# Patient Record
Sex: Female | Born: 1935 | Race: White | Hispanic: No | Smoking: Never smoker
Health system: Southern US, Community
[De-identification: ages and names within clinical notes are randomized; demographics above are authoritative.]

## PROBLEM LIST (undated history)

## (undated) DIAGNOSIS — R296 Repeated falls: Secondary | ICD-10-CM

## (undated) DIAGNOSIS — M1990 Unspecified osteoarthritis, unspecified site: Secondary | ICD-10-CM

## (undated) DIAGNOSIS — I447 Left bundle-branch block, unspecified: Secondary | ICD-10-CM

## (undated) DIAGNOSIS — K219 Gastro-esophageal reflux disease without esophagitis: Secondary | ICD-10-CM

## (undated) DIAGNOSIS — D649 Anemia, unspecified: Secondary | ICD-10-CM

## (undated) DIAGNOSIS — I609 Nontraumatic subarachnoid hemorrhage, unspecified: Secondary | ICD-10-CM

## (undated) DIAGNOSIS — J449 Chronic obstructive pulmonary disease, unspecified: Secondary | ICD-10-CM

## (undated) DIAGNOSIS — E538 Deficiency of other specified B group vitamins: Secondary | ICD-10-CM

## (undated) DIAGNOSIS — E871 Hypo-osmolality and hyponatremia: Secondary | ICD-10-CM

## (undated) DIAGNOSIS — M949 Disorder of cartilage, unspecified: Secondary | ICD-10-CM

## (undated) DIAGNOSIS — I509 Heart failure, unspecified: Secondary | ICD-10-CM

## (undated) DIAGNOSIS — I10 Essential (primary) hypertension: Secondary | ICD-10-CM

## (undated) DIAGNOSIS — M810 Age-related osteoporosis without current pathological fracture: Secondary | ICD-10-CM

## (undated) DIAGNOSIS — M899 Disorder of bone, unspecified: Secondary | ICD-10-CM

## (undated) DIAGNOSIS — S065X9A Traumatic subdural hemorrhage with loss of consciousness of unspecified duration, initial encounter: Secondary | ICD-10-CM

## (undated) DIAGNOSIS — E039 Hypothyroidism, unspecified: Secondary | ICD-10-CM

## (undated) DIAGNOSIS — I359 Nonrheumatic aortic valve disorder, unspecified: Secondary | ICD-10-CM

## (undated) HISTORY — DX: Hypo-osmolality and hyponatremia: E87.1

## (undated) HISTORY — DX: Nontraumatic subarachnoid hemorrhage, unspecified: I60.9

## (undated) HISTORY — PX: TONSILLECTOMY: SUR1361

## (undated) HISTORY — DX: Essential (primary) hypertension: I10

## (undated) HISTORY — DX: Heart failure, unspecified: I50.9

## (undated) HISTORY — PX: OTHER SURGICAL HISTORY: SHX169

## (undated) HISTORY — PX: APPENDECTOMY: SHX54

## (undated) HISTORY — DX: Chronic obstructive pulmonary disease, unspecified: J44.9

## (undated) HISTORY — DX: Anemia, unspecified: D64.9

## (undated) HISTORY — DX: Nonrheumatic aortic valve disorder, unspecified: I35.9

## (undated) HISTORY — DX: Left bundle-branch block, unspecified: I44.7

## (undated) HISTORY — DX: Age-related osteoporosis without current pathological fracture: M81.0

## (undated) HISTORY — DX: Traumatic subdural hemorrhage with loss of consciousness of unspecified duration, initial encounter: S06.5X9A

## (undated) HISTORY — DX: Disorder of cartilage, unspecified: M94.9

## (undated) HISTORY — DX: Disorder of bone, unspecified: M89.9

## (undated) HISTORY — PX: ABDOMINAL HYSTERECTOMY: SHX81

## (undated) HISTORY — DX: Unspecified osteoarthritis, unspecified site: M19.90

## (undated) HISTORY — DX: Gastro-esophageal reflux disease without esophagitis: K21.9

## (undated) HISTORY — DX: Hypothyroidism, unspecified: E03.9

## (undated) HISTORY — DX: Deficiency of other specified B group vitamins: E53.8

---

## 2000-08-02 ENCOUNTER — Encounter: Admission: RE | Admit: 2000-08-02 | Discharge: 2000-10-31 | Payer: Self-pay | Admitting: Internal Medicine

## 2000-11-07 ENCOUNTER — Encounter: Payer: Self-pay | Admitting: Emergency Medicine

## 2000-11-07 ENCOUNTER — Emergency Department (HOSPITAL_COMMUNITY): Admission: EM | Admit: 2000-11-07 | Discharge: 2000-11-07 | Payer: Self-pay | Admitting: Emergency Medicine

## 2001-01-10 ENCOUNTER — Encounter: Admission: RE | Admit: 2001-01-10 | Discharge: 2001-04-10 | Payer: Self-pay | Admitting: Internal Medicine

## 2001-04-25 ENCOUNTER — Encounter: Admission: RE | Admit: 2001-04-25 | Discharge: 2001-07-24 | Payer: Self-pay | Admitting: Internal Medicine

## 2004-02-14 ENCOUNTER — Emergency Department (HOSPITAL_COMMUNITY): Admission: EM | Admit: 2004-02-14 | Discharge: 2004-02-14 | Payer: Self-pay | Admitting: Emergency Medicine

## 2004-03-31 ENCOUNTER — Ambulatory Visit: Payer: Self-pay | Admitting: Internal Medicine

## 2004-04-21 ENCOUNTER — Ambulatory Visit: Payer: Self-pay | Admitting: Internal Medicine

## 2004-06-17 ENCOUNTER — Ambulatory Visit: Payer: Self-pay | Admitting: Internal Medicine

## 2004-07-16 ENCOUNTER — Ambulatory Visit: Payer: Self-pay | Admitting: Internal Medicine

## 2004-08-13 ENCOUNTER — Ambulatory Visit: Payer: Self-pay | Admitting: Internal Medicine

## 2004-10-22 ENCOUNTER — Ambulatory Visit: Payer: Self-pay | Admitting: Internal Medicine

## 2004-11-09 ENCOUNTER — Ambulatory Visit (HOSPITAL_COMMUNITY): Admission: RE | Admit: 2004-11-09 | Discharge: 2004-11-09 | Payer: Self-pay | Admitting: Internal Medicine

## 2004-11-09 ENCOUNTER — Ambulatory Visit: Payer: Self-pay | Admitting: Internal Medicine

## 2005-01-25 ENCOUNTER — Ambulatory Visit: Payer: Self-pay | Admitting: Internal Medicine

## 2005-02-17 ENCOUNTER — Ambulatory Visit: Payer: Self-pay | Admitting: Internal Medicine

## 2005-04-04 ENCOUNTER — Ambulatory Visit: Payer: Self-pay | Admitting: Internal Medicine

## 2005-05-05 ENCOUNTER — Inpatient Hospital Stay (HOSPITAL_COMMUNITY): Admission: EM | Admit: 2005-05-05 | Discharge: 2005-05-07 | Payer: Self-pay | Admitting: Dietician

## 2005-05-05 ENCOUNTER — Ambulatory Visit: Payer: Self-pay | Admitting: Internal Medicine

## 2005-05-06 ENCOUNTER — Encounter: Payer: Self-pay | Admitting: Cardiology

## 2005-05-06 ENCOUNTER — Ambulatory Visit: Payer: Self-pay | Admitting: Cardiology

## 2005-05-27 ENCOUNTER — Ambulatory Visit: Payer: Self-pay | Admitting: Internal Medicine

## 2005-05-30 ENCOUNTER — Emergency Department (HOSPITAL_COMMUNITY): Admission: EM | Admit: 2005-05-30 | Discharge: 2005-05-30 | Payer: Self-pay | Admitting: *Deleted

## 2005-06-01 ENCOUNTER — Ambulatory Visit: Payer: Self-pay | Admitting: Internal Medicine

## 2005-07-08 ENCOUNTER — Ambulatory Visit: Payer: Self-pay | Admitting: Internal Medicine

## 2005-08-05 ENCOUNTER — Ambulatory Visit: Payer: Self-pay | Admitting: Internal Medicine

## 2005-08-12 ENCOUNTER — Ambulatory Visit: Payer: Self-pay | Admitting: Internal Medicine

## 2005-09-02 ENCOUNTER — Ambulatory Visit: Payer: Self-pay | Admitting: Internal Medicine

## 2005-10-07 ENCOUNTER — Ambulatory Visit: Payer: Self-pay | Admitting: Internal Medicine

## 2005-11-22 ENCOUNTER — Ambulatory Visit: Payer: Self-pay | Admitting: Internal Medicine

## 2005-11-22 ENCOUNTER — Ambulatory Visit (HOSPITAL_COMMUNITY): Admission: RE | Admit: 2005-11-22 | Discharge: 2005-11-22 | Payer: Self-pay | Admitting: Internal Medicine

## 2005-12-23 ENCOUNTER — Ambulatory Visit: Payer: Self-pay | Admitting: Internal Medicine

## 2006-02-03 ENCOUNTER — Ambulatory Visit: Payer: Self-pay | Admitting: Internal Medicine

## 2006-03-03 ENCOUNTER — Ambulatory Visit: Payer: Self-pay | Admitting: Internal Medicine

## 2006-03-31 ENCOUNTER — Ambulatory Visit: Payer: Self-pay | Admitting: Internal Medicine

## 2006-04-28 ENCOUNTER — Ambulatory Visit: Payer: Self-pay | Admitting: Internal Medicine

## 2006-06-16 ENCOUNTER — Ambulatory Visit: Payer: Self-pay | Admitting: Internal Medicine

## 2006-06-16 LAB — CONVERTED CEMR LAB
ALT: 13 units/L (ref 0–40)
AST: 19 units/L (ref 0–37)
Basophils Relative: 0.7 % (ref 0.0–1.0)
Bilirubin, Direct: 0.1 mg/dL (ref 0.0–0.3)
Calcium: 9.5 mg/dL (ref 8.4–10.5)
Chloride: 104 meq/L (ref 96–112)
GFR calc non Af Amer: 84 mL/min
Glucose, Bld: 97 mg/dL (ref 70–99)
HCT: 34.4 % — ABNORMAL LOW (ref 36.0–46.0)
Hemoglobin: 11.6 g/dL — ABNORMAL LOW (ref 12.0–15.0)
LDL Cholesterol: 84 mg/dL (ref 0–99)
Monocytes Absolute: 1 10*3/uL — ABNORMAL HIGH (ref 0.2–0.7)
Monocytes Relative: 13.9 % — ABNORMAL HIGH (ref 3.0–11.0)
Neutrophils Relative %: 62.7 % (ref 43.0–77.0)
RDW: 14.9 % — ABNORMAL HIGH (ref 11.5–14.6)
TSH: 1 microintl units/mL (ref 0.35–5.50)
VLDL: 13 mg/dL (ref 0–40)
Vitamin B-12: >1500 pg/mL — ABNORMAL HIGH (ref 211–911)
WBC: 7 10*3/uL (ref 4.5–10.5)

## 2006-07-11 ENCOUNTER — Ambulatory Visit: Payer: Self-pay | Admitting: Internal Medicine

## 2006-07-21 ENCOUNTER — Ambulatory Visit: Payer: Self-pay | Admitting: Internal Medicine

## 2006-08-18 ENCOUNTER — Ambulatory Visit: Payer: Self-pay | Admitting: Internal Medicine

## 2006-09-15 ENCOUNTER — Ambulatory Visit: Payer: Self-pay | Admitting: Internal Medicine

## 2006-10-11 ENCOUNTER — Encounter: Payer: Self-pay | Admitting: Internal Medicine

## 2006-10-11 DIAGNOSIS — R55 Syncope and collapse: Secondary | ICD-10-CM

## 2006-10-11 DIAGNOSIS — I447 Left bundle-branch block, unspecified: Secondary | ICD-10-CM

## 2006-10-11 DIAGNOSIS — E538 Deficiency of other specified B group vitamins: Secondary | ICD-10-CM

## 2006-10-11 DIAGNOSIS — I10 Essential (primary) hypertension: Secondary | ICD-10-CM

## 2006-10-11 DIAGNOSIS — M899 Disorder of bone, unspecified: Secondary | ICD-10-CM | POA: Insufficient documentation

## 2006-10-11 DIAGNOSIS — I498 Other specified cardiac arrhythmias: Secondary | ICD-10-CM

## 2006-10-11 DIAGNOSIS — J309 Allergic rhinitis, unspecified: Secondary | ICD-10-CM | POA: Insufficient documentation

## 2006-10-11 DIAGNOSIS — E039 Hypothyroidism, unspecified: Secondary | ICD-10-CM

## 2006-10-11 DIAGNOSIS — M949 Disorder of cartilage, unspecified: Secondary | ICD-10-CM

## 2006-10-11 HISTORY — DX: Disorder of bone, unspecified: M89.9

## 2006-10-11 HISTORY — DX: Hypothyroidism, unspecified: E03.9

## 2006-10-11 HISTORY — DX: Left bundle-branch block, unspecified: I44.7

## 2006-10-11 HISTORY — DX: Essential (primary) hypertension: I10

## 2006-10-11 HISTORY — DX: Deficiency of other specified B group vitamins: E53.8

## 2006-10-20 ENCOUNTER — Ambulatory Visit: Payer: Self-pay | Admitting: Internal Medicine

## 2006-11-24 ENCOUNTER — Ambulatory Visit: Payer: Self-pay | Admitting: Internal Medicine

## 2006-12-19 ENCOUNTER — Ambulatory Visit (HOSPITAL_COMMUNITY): Admission: RE | Admit: 2006-12-19 | Discharge: 2006-12-19 | Payer: Self-pay | Admitting: Internal Medicine

## 2007-01-26 ENCOUNTER — Ambulatory Visit: Payer: Self-pay | Admitting: Internal Medicine

## 2007-01-26 LAB — CONVERTED CEMR LAB
Basophils Relative: 0.1 % (ref 0.0–1.0)
Bilirubin, Direct: 0.1 mg/dL (ref 0.0–0.3)
CO2: 29 meq/L (ref 19–32)
Eosinophils Relative: 1.5 % (ref 0.0–5.0)
GFR calc Af Amer: 102 mL/min
Glucose, Bld: 94 mg/dL (ref 70–99)
HCT: 33.7 % — ABNORMAL LOW (ref 36.0–46.0)
Hemoglobin: 11.5 g/dL — ABNORMAL LOW (ref 12.0–15.0)
Lymphocytes Relative: 14.9 % (ref 12.0–46.0)
Monocytes Absolute: 1.2 10*3/uL — ABNORMAL HIGH (ref 0.2–0.7)
Neutro Abs: 6 10*3/uL (ref 1.4–7.7)
Neutrophils Relative %: 70.1 % (ref 43.0–77.0)
Potassium: 4.6 meq/L (ref 3.5–5.1)
Total Bilirubin: 0.7 mg/dL (ref 0.3–1.2)
Total Protein: 7 g/dL (ref 6.0–8.3)
WBC: 8.6 10*3/uL (ref 4.5–10.5)

## 2007-02-04 ENCOUNTER — Emergency Department (HOSPITAL_COMMUNITY): Admission: EM | Admit: 2007-02-04 | Discharge: 2007-02-04 | Payer: Self-pay | Admitting: Emergency Medicine

## 2007-02-06 ENCOUNTER — Ambulatory Visit: Payer: Self-pay | Admitting: Internal Medicine

## 2007-02-09 ENCOUNTER — Ambulatory Visit: Payer: Self-pay

## 2007-02-09 ENCOUNTER — Encounter: Payer: Self-pay | Admitting: Internal Medicine

## 2007-02-10 ENCOUNTER — Inpatient Hospital Stay (HOSPITAL_COMMUNITY): Admission: EM | Admit: 2007-02-10 | Discharge: 2007-02-14 | Payer: Self-pay | Admitting: Emergency Medicine

## 2007-02-13 ENCOUNTER — Ambulatory Visit: Payer: Self-pay | Admitting: Internal Medicine

## 2007-02-16 ENCOUNTER — Ambulatory Visit: Payer: Self-pay | Admitting: Internal Medicine

## 2007-02-23 ENCOUNTER — Ambulatory Visit: Payer: Self-pay | Admitting: Internal Medicine

## 2007-02-23 ENCOUNTER — Encounter: Payer: Self-pay | Admitting: Internal Medicine

## 2007-02-23 DIAGNOSIS — I359 Nonrheumatic aortic valve disorder, unspecified: Secondary | ICD-10-CM

## 2007-02-23 DIAGNOSIS — R21 Rash and other nonspecific skin eruption: Secondary | ICD-10-CM | POA: Insufficient documentation

## 2007-02-23 DIAGNOSIS — M810 Age-related osteoporosis without current pathological fracture: Secondary | ICD-10-CM | POA: Insufficient documentation

## 2007-02-23 HISTORY — DX: Nonrheumatic aortic valve disorder, unspecified: I35.9

## 2007-02-23 HISTORY — DX: Age-related osteoporosis without current pathological fracture: M81.0

## 2007-04-24 ENCOUNTER — Emergency Department (HOSPITAL_COMMUNITY): Admission: EM | Admit: 2007-04-24 | Discharge: 2007-04-24 | Payer: Self-pay | Admitting: Emergency Medicine

## 2007-04-27 ENCOUNTER — Ambulatory Visit: Payer: Self-pay | Admitting: Internal Medicine

## 2007-04-27 DIAGNOSIS — R079 Chest pain, unspecified: Secondary | ICD-10-CM

## 2007-04-30 ENCOUNTER — Encounter: Payer: Self-pay | Admitting: Internal Medicine

## 2007-05-01 ENCOUNTER — Inpatient Hospital Stay (HOSPITAL_COMMUNITY): Admission: EM | Admit: 2007-05-01 | Discharge: 2007-05-09 | Payer: Self-pay | Admitting: Emergency Medicine

## 2007-05-01 ENCOUNTER — Ambulatory Visit: Payer: Self-pay | Admitting: Internal Medicine

## 2007-05-04 ENCOUNTER — Encounter: Payer: Self-pay | Admitting: Gastroenterology

## 2007-05-04 ENCOUNTER — Encounter: Payer: Self-pay | Admitting: Internal Medicine

## 2007-05-08 ENCOUNTER — Ambulatory Visit: Payer: Self-pay | Admitting: Gastroenterology

## 2007-05-13 ENCOUNTER — Inpatient Hospital Stay (HOSPITAL_COMMUNITY): Admission: EM | Admit: 2007-05-13 | Discharge: 2007-05-23 | Payer: Self-pay | Admitting: Emergency Medicine

## 2007-05-13 ENCOUNTER — Ambulatory Visit: Payer: Self-pay | Admitting: Cardiology

## 2007-05-15 ENCOUNTER — Encounter (INDEPENDENT_AMBULATORY_CARE_PROVIDER_SITE_OTHER): Payer: Self-pay | Admitting: Emergency Medicine

## 2007-05-30 ENCOUNTER — Emergency Department (HOSPITAL_COMMUNITY): Admission: EM | Admit: 2007-05-30 | Discharge: 2007-05-31 | Payer: Self-pay | Admitting: Emergency Medicine

## 2007-07-15 ENCOUNTER — Encounter: Payer: Self-pay | Admitting: Internal Medicine

## 2007-07-16 ENCOUNTER — Encounter: Payer: Self-pay | Admitting: Internal Medicine

## 2007-07-23 ENCOUNTER — Ambulatory Visit: Payer: Self-pay | Admitting: Internal Medicine

## 2007-07-23 DIAGNOSIS — R5383 Other fatigue: Secondary | ICD-10-CM

## 2007-07-23 DIAGNOSIS — J449 Chronic obstructive pulmonary disease, unspecified: Secondary | ICD-10-CM

## 2007-07-23 DIAGNOSIS — I509 Heart failure, unspecified: Secondary | ICD-10-CM | POA: Insufficient documentation

## 2007-07-23 DIAGNOSIS — E871 Hypo-osmolality and hyponatremia: Secondary | ICD-10-CM

## 2007-07-23 DIAGNOSIS — R5381 Other malaise: Secondary | ICD-10-CM | POA: Insufficient documentation

## 2007-07-23 HISTORY — DX: Heart failure, unspecified: I50.9

## 2007-07-23 HISTORY — DX: Chronic obstructive pulmonary disease, unspecified: J44.9

## 2007-07-23 HISTORY — DX: Hypo-osmolality and hyponatremia: E87.1

## 2007-07-24 LAB — CONVERTED CEMR LAB
ALT: 12 units/L (ref 0–35)
AST: 18 units/L (ref 0–37)
Albumin: 4 g/dL (ref 3.5–5.2)
Basophils Absolute: 0 10*3/uL (ref 0.0–0.1)
Calcium: 9.5 mg/dL (ref 8.4–10.5)
Chloride: 100 meq/L (ref 96–112)
Creatinine, Ser: 1.2 mg/dL (ref 0.4–1.2)
Eosinophils Relative: 1.7 % (ref 0.0–5.0)
HCT: 32.2 % — ABNORMAL LOW (ref 36.0–46.0)
MCHC: 32.5 g/dL (ref 30.0–36.0)
Neutrophils Relative %: 59.6 % (ref 43.0–77.0)
Platelets: 349 10*3/uL (ref 150–400)
RBC: 3.14 M/uL — ABNORMAL LOW (ref 3.87–5.11)
RDW: 17.9 % — ABNORMAL HIGH (ref 11.5–14.6)
Sodium: 137 meq/L (ref 135–145)
TSH: 1.29 microintl units/mL (ref 0.35–5.50)
Total Bilirubin: 0.9 mg/dL (ref 0.3–1.2)
WBC: 6.5 10*3/uL (ref 4.5–10.5)

## 2007-07-31 ENCOUNTER — Telehealth: Payer: Self-pay | Admitting: Internal Medicine

## 2007-08-21 ENCOUNTER — Telehealth (INDEPENDENT_AMBULATORY_CARE_PROVIDER_SITE_OTHER): Payer: Self-pay | Admitting: *Deleted

## 2007-09-14 ENCOUNTER — Ambulatory Visit: Payer: Self-pay | Admitting: Internal Medicine

## 2007-09-14 DIAGNOSIS — D649 Anemia, unspecified: Secondary | ICD-10-CM | POA: Insufficient documentation

## 2007-09-14 DIAGNOSIS — R634 Abnormal weight loss: Secondary | ICD-10-CM | POA: Insufficient documentation

## 2007-09-14 DIAGNOSIS — M545 Low back pain: Secondary | ICD-10-CM | POA: Insufficient documentation

## 2007-09-14 HISTORY — DX: Anemia, unspecified: D64.9

## 2007-11-07 ENCOUNTER — Ambulatory Visit: Payer: Self-pay | Admitting: Internal Medicine

## 2007-12-14 ENCOUNTER — Ambulatory Visit: Payer: Self-pay | Admitting: Internal Medicine

## 2007-12-24 ENCOUNTER — Ambulatory Visit (HOSPITAL_COMMUNITY): Admission: RE | Admit: 2007-12-24 | Discharge: 2007-12-24 | Payer: Self-pay | Admitting: Internal Medicine

## 2008-01-11 ENCOUNTER — Ambulatory Visit: Payer: Self-pay | Admitting: Internal Medicine

## 2008-02-07 ENCOUNTER — Ambulatory Visit: Payer: Self-pay | Admitting: Internal Medicine

## 2008-02-11 LAB — CONVERTED CEMR LAB
Basophils Absolute: 0 10*3/uL (ref 0.0–0.1)
Basophils Relative: 0.3 % (ref 0.0–3.0)
Eosinophils Absolute: 0.1 10*3/uL (ref 0.0–0.7)
Eosinophils Relative: 1.4 % (ref 0.0–5.0)
Folate: >20 ng/mL
HCT: 32.3 % — ABNORMAL LOW (ref 36.0–46.0)
Hemoglobin: 10.8 g/dL — ABNORMAL LOW (ref 12.0–15.0)
Iron: 106 ug/dL (ref 42–145)
Lymphocytes Relative: 16.6 % (ref 12.0–46.0)
MCHC: 33.5 g/dL (ref 30.0–36.0)
MCV: 105.3 fL — ABNORMAL HIGH (ref 78.0–100.0)
Monocytes Absolute: 1.1 10*3/uL — ABNORMAL HIGH (ref 0.1–1.0)
Monocytes Relative: 15.3 % — ABNORMAL HIGH (ref 3.0–12.0)
Neutro Abs: 4.6 10*3/uL (ref 1.4–7.7)
Neutrophils Relative %: 66.4 % (ref 43.0–77.0)
Platelets: 260 10*3/uL (ref 150–400)
RBC: 3.07 M/uL — ABNORMAL LOW (ref 3.87–5.11)
RDW: 14.3 % (ref 11.5–14.6)
Saturation Ratios: 36.3 % (ref 20.0–50.0)
TSH: 0.05 microintl units/mL — ABNORMAL LOW (ref 0.35–5.50)
Transferrin: 208.8 mg/dL — ABNORMAL LOW (ref >=212.0)
Vitamin B-12: >1500 pg/mL — ABNORMAL HIGH (ref 211–911)
WBC: 7 10*3/uL (ref 4.5–10.5)

## 2008-03-14 ENCOUNTER — Ambulatory Visit: Payer: Self-pay | Admitting: Internal Medicine

## 2008-06-11 ENCOUNTER — Ambulatory Visit: Payer: Self-pay | Admitting: Internal Medicine

## 2008-07-15 ENCOUNTER — Ambulatory Visit: Payer: Self-pay | Admitting: Internal Medicine

## 2008-08-22 ENCOUNTER — Ambulatory Visit: Payer: Self-pay | Admitting: Internal Medicine

## 2008-09-24 ENCOUNTER — Ambulatory Visit: Payer: Self-pay | Admitting: Internal Medicine

## 2008-09-24 LAB — CONVERTED CEMR LAB
ALT: 13 units/L (ref 0–35)
BUN: 25 mg/dL — ABNORMAL HIGH (ref 6–23)
Basophils Relative: 0.1 % (ref 0.0–3.0)
Bilirubin, Direct: 0.2 mg/dL (ref 0.0–0.3)
CO2: 30 meq/L (ref 19–32)
Chloride: 104 meq/L (ref 96–112)
Cholesterol: 152 mg/dL (ref 0–200)
Creatinine, Ser: 1.3 mg/dL — ABNORMAL HIGH (ref 0.4–1.2)
Eosinophils Absolute: 0.1 10*3/uL (ref 0.0–0.7)
Eosinophils Relative: 1.2 % (ref 0.0–5.0)
HCT: 31.8 % — ABNORMAL LOW (ref 36.0–46.0)
HDL: 73.7 mg/dL (ref >39.00)
Ketones, ur: NEGATIVE mg/dL
Lymphs Abs: 1.2 10*3/uL (ref 0.7–4.0)
MCHC: 33.6 g/dL (ref 30.0–36.0)
MCV: 107.6 fL — ABNORMAL HIGH (ref 78.0–100.0)
Monocytes Absolute: 0.8 10*3/uL (ref 0.1–1.0)
Neutrophils Relative %: 66.1 % (ref 43.0–77.0)
Platelets: 255 10*3/uL (ref 150.0–400.0)
Potassium: 4.1 meq/L (ref 3.5–5.1)
Specific Gravity, Urine: <=1.005 (ref 1.000–1.030)
Total Bilirubin: 1 mg/dL (ref 0.3–1.2)
Total Protein, Urine: NEGATIVE mg/dL
Total Protein: 7.1 g/dL (ref 6.0–8.3)
Triglycerides: 59 mg/dL (ref 0.0–149.0)
Urine Glucose: NEGATIVE mg/dL
WBC: 6.1 10*3/uL (ref 4.5–10.5)

## 2008-09-25 LAB — CONVERTED CEMR LAB: Vit D, 25-Hydroxy: 103 ng/mL — ABNORMAL HIGH (ref 30–89)

## 2008-11-14 ENCOUNTER — Ambulatory Visit: Payer: Self-pay | Admitting: Internal Medicine

## 2008-12-30 ENCOUNTER — Ambulatory Visit: Payer: Self-pay | Admitting: Internal Medicine

## 2009-02-05 ENCOUNTER — Ambulatory Visit: Payer: Self-pay | Admitting: Internal Medicine

## 2009-03-01 IMAGING — CT CT ABDOMEN WO/W CM
3 of 6 series · 14 of 46 positions shown, 16 images · IV contrast (omnipaque)
Comparison: MRI lumbar spine yesterday.

CLINICAL INFORMATION: Possible left upper pole renal mass on MRI of the lumbar
spine yesterday.

CT ABDOMEN WITHOUT AND WITH CONTRAST 05/03/2007:
TECHNIQUE: Multidetector CT imaging of the abdomen was performed before and
during bolus administration of intravenous contrast. Oral contrast was given.
Delayed imaging through the kidneys was performed.
Contrast:  100 cc Omnipaque 300

[Series 4: abd_ with 5.0 b40s · axial · 0.69mm/px · z∈[-270,-100]mm · 9 of 44 slices shown, 11 images]
[im 5/44  soft-tissue]
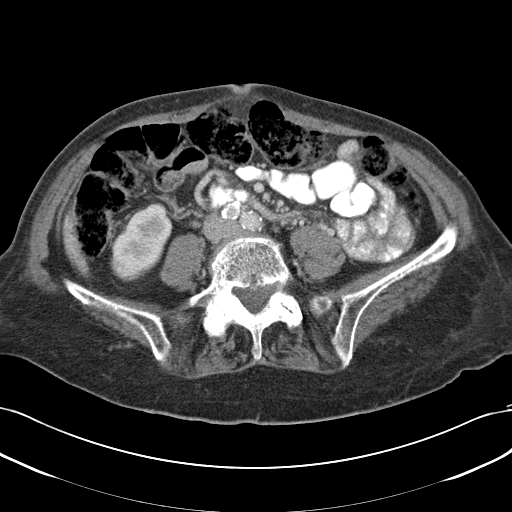
[im 5/44  bone]
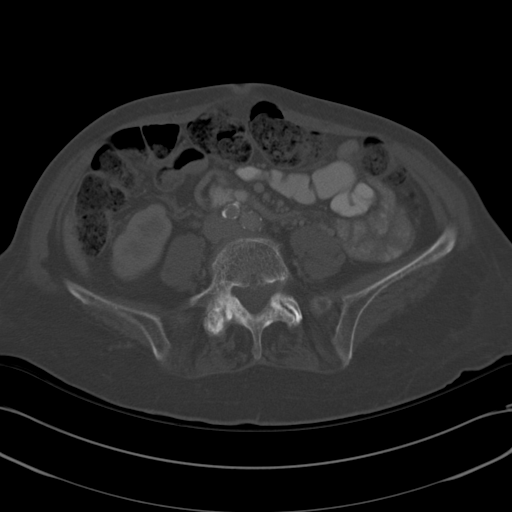
[im 9/44  soft-tissue]
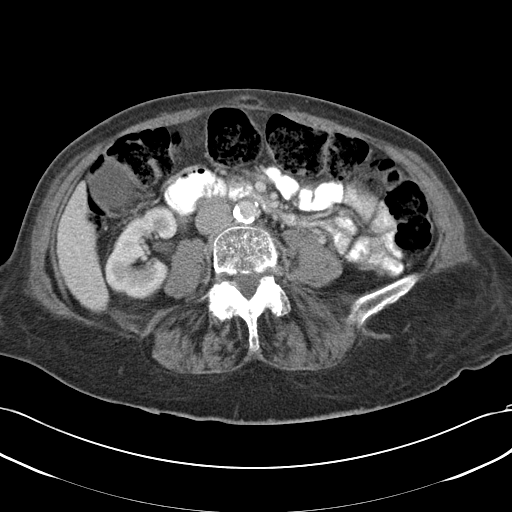
[im 13/44  soft-tissue]
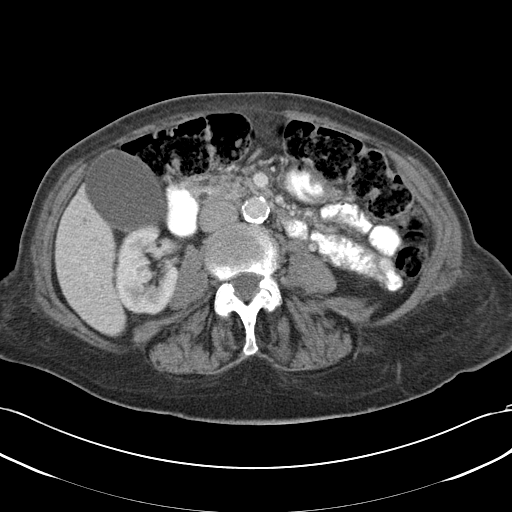
[im 18/44  soft-tissue]
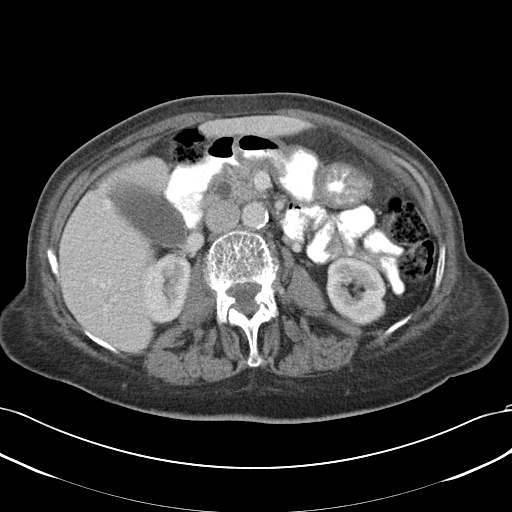
[im 22/44  soft-tissue]
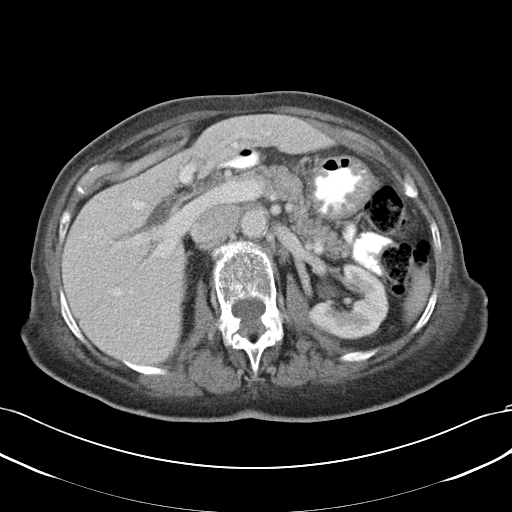
[im 26/44  soft-tissue]
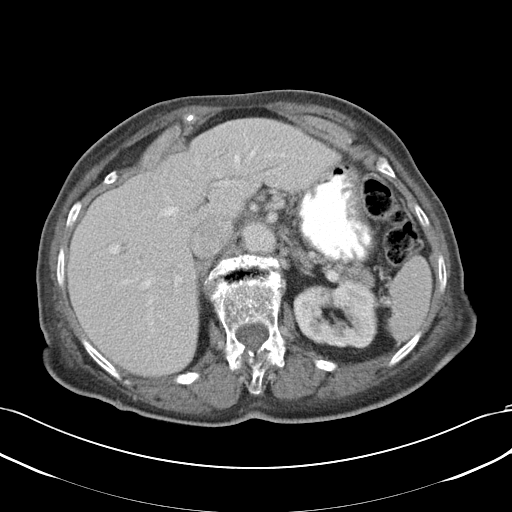
[im 31/44  soft-tissue]
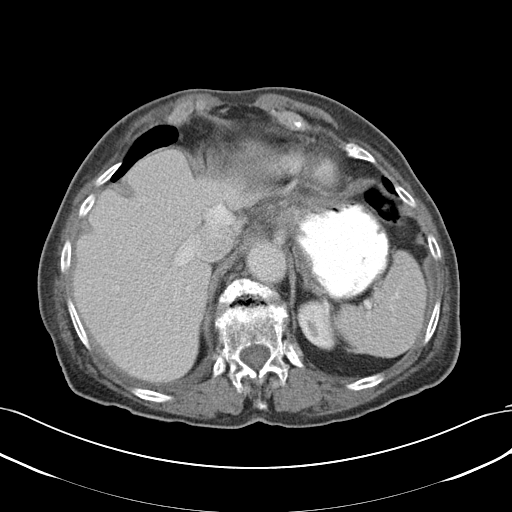
[im 35/44  soft-tissue]
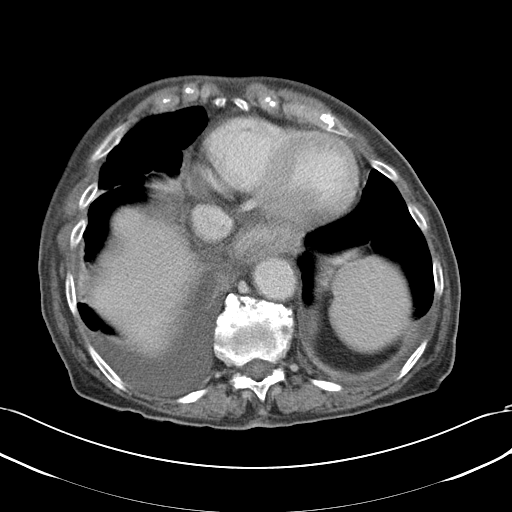
[im 39/44  soft-tissue]
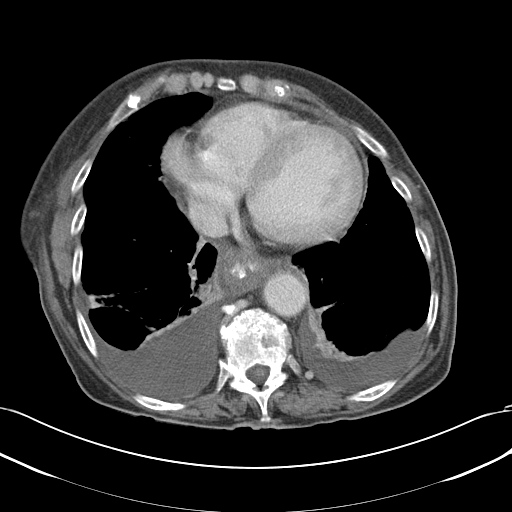
[im 39/44  bone]
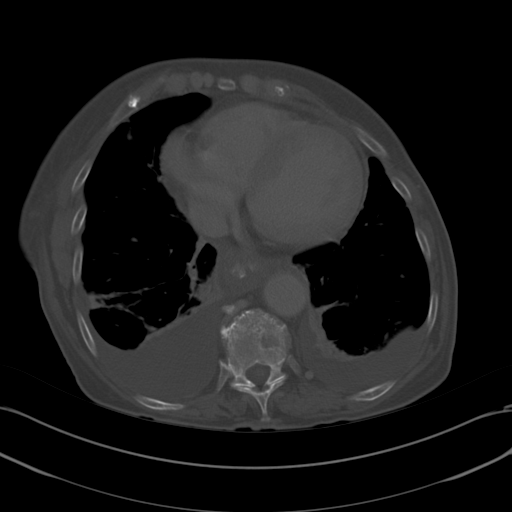

[Series 5: abd_ with 5.0 b60f · axial · 0.69mm/px · z∈[-130,-106]mm · 2 of 17 slices shown]
[im 6/17  soft-tissue]
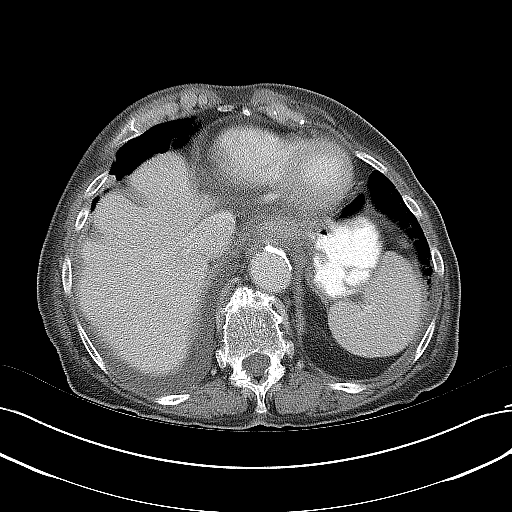
[im 11/17  soft-tissue]
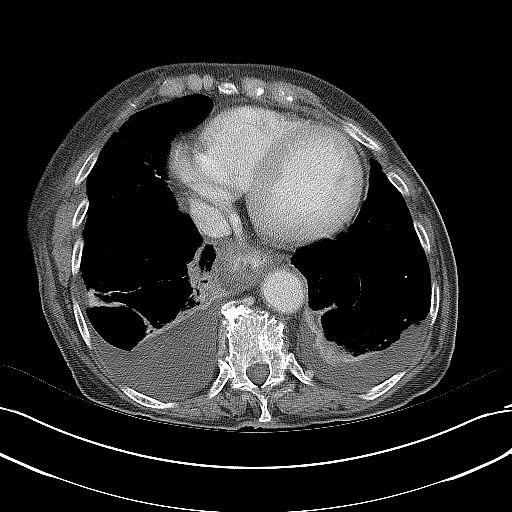

[Series 602: <mpr thick range> · coronal · 0.69mm/px · 3 of 81 slices shown]
[im 27/81  soft-tissue]
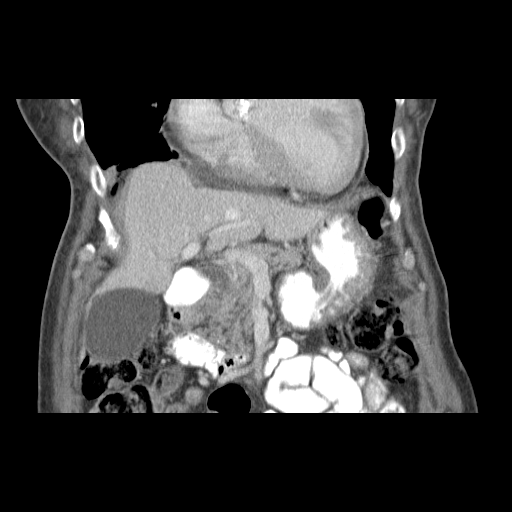
[im 36/81  soft-tissue]
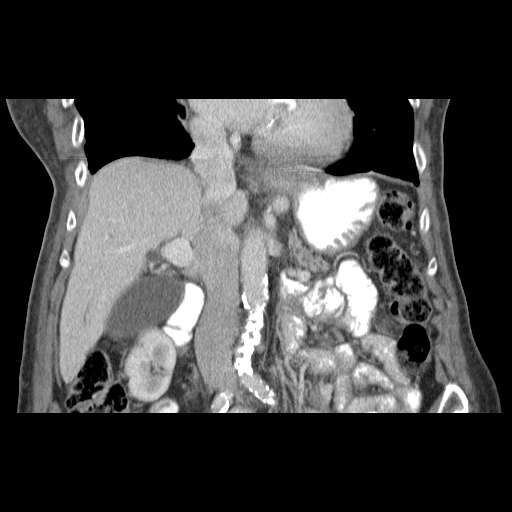
[im 45/81  soft-tissue]
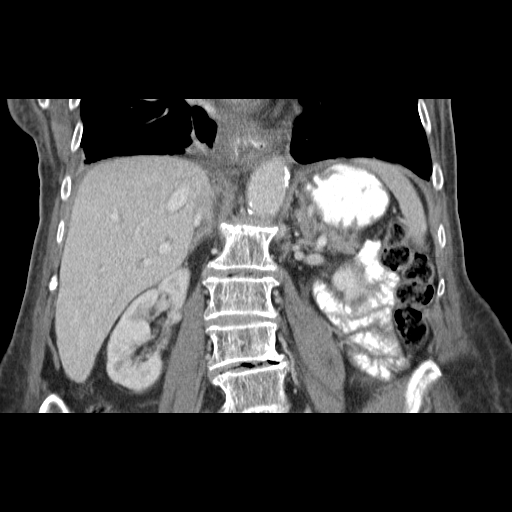

[14 of 46 positions shown; findings below may reference images not displayed]

FINDINGS: Unenhanced images demonstrating cortical calcifications in the upper
pole of the left kidney associated with an area of focal scarring. Enhanced
images demonstrating no left upper pole renal mass; due to the scarring, the
kidney has a lobular, almost mass-like contour in this region, accounting for
the abnormality on the MRI. No solid renal masses or cysts identified in either
kidney. Mild scarring also noted in the mid and lower pole of the right kidney.
No hydronephrosis involving either kidney.

Normal appearing liver, spleen, pancreas, and adrenal glands. Gallbladder
borderline distended without calcified gallstones. Mild extrahepatic biliary
ductal dilation up to approximately 13 mm diameter; the duct can be followed to
the ampulla without evidence of duct stone or visible mass. Small hiatal hernia
present with thickening of the wall of the distal esophagus and oral contrast
within the distal esophagus. Large amount of stool within an otherwise normal
appearing colon. Visualized small bowel unremarkable. Aortic and common iliac
artery atherosclerosis without aneurysm. Patent visceral arteries. No ascites.
No significant lymphadenopathy.

Bilateral pleural effusions, right greater than left, with associated
atelectasis in the lower lobes. Mild atelectasis or scarring in the right middle
lobe. Heart enlarged with left ventricular predominance. Bone window images
demonstrating severe diffuse lower thoracic and lumbar degenerative changes.
IMPRESSION: 1. No evidence of left upper pole renal mass as questioned on the MRI yesterday.
Scarring in the left upper lobe with lobular contour to the kidney in this
region accounts for that appearance.
2. Dilation of the extrahepatic common bile duct up to approximately 13 mm
diameter. In the absence of obstructing stone or mass, biliary dyskinesis
certainly possible.
3. Hiatal hernia with marked thickening of the wall of the distal esophagus and
oral contrast within the esophagus. Gastroesophageal reflux with distal
esophageal inflammation and/or scarring suspected, though distal esophageal mass
not entirely excluded. 
4. Bilateral pleural effusions, right greater than left.

## 2009-03-05 ENCOUNTER — Ambulatory Visit: Payer: Self-pay | Admitting: Internal Medicine

## 2009-03-17 ENCOUNTER — Inpatient Hospital Stay (HOSPITAL_COMMUNITY): Admission: EM | Admit: 2009-03-17 | Discharge: 2009-03-21 | Payer: Self-pay | Admitting: Emergency Medicine

## 2009-03-18 ENCOUNTER — Encounter (INDEPENDENT_AMBULATORY_CARE_PROVIDER_SITE_OTHER): Payer: Self-pay | Admitting: Internal Medicine

## 2009-03-18 ENCOUNTER — Ambulatory Visit: Payer: Self-pay | Admitting: Vascular Surgery

## 2009-04-16 ENCOUNTER — Ambulatory Visit: Payer: Self-pay | Admitting: Internal Medicine

## 2009-04-16 LAB — CONVERTED CEMR LAB
Anticardiolipin IgG: <10 (ref <10)
Homocysteine: 20.8 micromoles/L — ABNORMAL HIGH (ref 4.0–15.4)
Protein C Activity: 192 % — ABNORMAL HIGH (ref 75–133)

## 2009-04-21 LAB — CONVERTED CEMR LAB
ALT: 14 units/L (ref 0–35)
AST: 26 units/L (ref 0–37)
Albumin: 4 g/dL (ref 3.5–5.2)
Alkaline Phosphatase: 60 units/L (ref 39–117)
Basophils Relative: 0.7 % (ref 0.0–3.0)
Chloride: 97 meq/L (ref 96–112)
Eosinophils Relative: 4.6 % (ref 0.0–5.0)
Folate: >20 ng/mL
GFR calc non Af Amer: 28.03 mL/min (ref >60)
HCT: 29.4 % — ABNORMAL LOW (ref 36.0–46.0)
Hemoglobin: 9.8 g/dL — ABNORMAL LOW (ref 12.0–15.0)
Lymphs Abs: 1.2 10*3/uL (ref 0.7–4.0)
Monocytes Relative: 10.3 % (ref 3.0–12.0)
Platelets: 220 10*3/uL (ref 150.0–400.0)
Potassium: 4.4 meq/L (ref 3.5–5.1)
Pro B Natriuretic peptide (BNP): 310 pg/mL — ABNORMAL HIGH (ref 0.0–100.0)
RBC: 2.71 M/uL — ABNORMAL LOW (ref 3.87–5.11)
Sed Rate: 31 mm/hr — ABNORMAL HIGH (ref 0–22)
Sodium: 131 meq/L — ABNORMAL LOW (ref 135–145)
TSH: 7.22 microintl units/mL — ABNORMAL HIGH (ref 0.35–5.50)
Vitamin B-12: >1500 pg/mL — ABNORMAL HIGH (ref 211–911)
WBC: 5.7 10*3/uL (ref 4.5–10.5)

## 2009-06-11 ENCOUNTER — Encounter (HOSPITAL_BASED_OUTPATIENT_CLINIC_OR_DEPARTMENT_OTHER): Admission: RE | Admit: 2009-06-11 | Discharge: 2009-09-09 | Payer: Self-pay | Admitting: Internal Medicine

## 2009-06-11 ENCOUNTER — Ambulatory Visit: Payer: Self-pay | Admitting: Internal Medicine

## 2009-06-26 ENCOUNTER — Ambulatory Visit (HOSPITAL_COMMUNITY): Admission: RE | Admit: 2009-06-26 | Discharge: 2009-06-26 | Payer: Self-pay | Admitting: General Surgery

## 2009-06-26 ENCOUNTER — Encounter (INDEPENDENT_AMBULATORY_CARE_PROVIDER_SITE_OTHER): Payer: Self-pay | Admitting: Radiology

## 2009-06-26 ENCOUNTER — Ambulatory Visit: Payer: Self-pay | Admitting: Vascular Surgery

## 2009-07-10 ENCOUNTER — Ambulatory Visit: Payer: Self-pay | Admitting: Internal Medicine

## 2009-07-24 ENCOUNTER — Encounter: Payer: Self-pay | Admitting: Internal Medicine

## 2009-08-07 ENCOUNTER — Ambulatory Visit: Payer: Self-pay | Admitting: Internal Medicine

## 2009-08-20 ENCOUNTER — Ambulatory Visit (HOSPITAL_COMMUNITY): Admission: RE | Admit: 2009-08-20 | Discharge: 2009-08-20 | Payer: Self-pay | Admitting: Internal Medicine

## 2009-09-11 ENCOUNTER — Ambulatory Visit: Payer: Self-pay | Admitting: Internal Medicine

## 2009-10-07 ENCOUNTER — Ambulatory Visit: Payer: Self-pay | Admitting: Internal Medicine

## 2009-11-26 ENCOUNTER — Ambulatory Visit: Payer: Self-pay | Admitting: Internal Medicine

## 2010-01-08 ENCOUNTER — Ambulatory Visit: Payer: Self-pay | Admitting: Internal Medicine

## 2010-02-04 ENCOUNTER — Ambulatory Visit: Payer: Self-pay | Admitting: Internal Medicine

## 2010-02-11 ENCOUNTER — Encounter (HOSPITAL_BASED_OUTPATIENT_CLINIC_OR_DEPARTMENT_OTHER)
Admission: RE | Admit: 2010-02-11 | Discharge: 2010-05-12 | Payer: Self-pay | Source: Home / Self Care | Attending: General Surgery | Admitting: General Surgery

## 2010-03-05 ENCOUNTER — Ambulatory Visit: Payer: Self-pay | Admitting: Internal Medicine

## 2010-04-12 ENCOUNTER — Ambulatory Visit: Payer: Self-pay | Admitting: Internal Medicine

## 2010-05-14 ENCOUNTER — Ambulatory Visit
Admission: RE | Admit: 2010-05-14 | Discharge: 2010-05-14 | Payer: Self-pay | Source: Home / Self Care | Attending: Internal Medicine | Admitting: Internal Medicine

## 2010-05-14 ENCOUNTER — Encounter (HOSPITAL_BASED_OUTPATIENT_CLINIC_OR_DEPARTMENT_OTHER)
Admission: RE | Admit: 2010-05-14 | Discharge: 2010-06-15 | Payer: Self-pay | Source: Home / Self Care | Attending: General Surgery | Admitting: General Surgery

## 2010-06-04 ENCOUNTER — Ambulatory Visit
Admission: RE | Admit: 2010-06-04 | Discharge: 2010-06-04 | Payer: Self-pay | Source: Home / Self Care | Attending: Internal Medicine | Admitting: Internal Medicine

## 2010-06-07 ENCOUNTER — Encounter: Payer: Self-pay | Admitting: Internal Medicine

## 2010-06-13 LAB — CONVERTED CEMR LAB
BUN: 31 mg/dL — ABNORMAL HIGH (ref 6–23)
Basophils Absolute: 0 10*3/uL (ref 0.0–0.1)
Bilirubin Urine: NEGATIVE
Chloride: 106 meq/L (ref 96–112)
Cholesterol: 158 mg/dL (ref 0–200)
Glucose, Bld: 82 mg/dL (ref 70–99)
HCT: 32.5 % — ABNORMAL LOW (ref 36.0–46.0)
HDL: 84 mg/dL (ref >39.00)
Hemoglobin, Urine: NEGATIVE
Lymphs Abs: 1.5 10*3/uL (ref 0.7–4.0)
MCV: 107.1 fL — ABNORMAL HIGH (ref 78.0–100.0)
Monocytes Absolute: 0.7 10*3/uL (ref 0.1–1.0)
PTH: 51.4 pg/mL (ref 14.0–72.0)
Platelets: 218 10*3/uL (ref 150.0–400.0)
Potassium: 4.7 meq/L (ref 3.5–5.1)
RDW: 16 % — ABNORMAL HIGH (ref 11.5–14.6)
TSH: 0.07 microintl units/mL — ABNORMAL LOW (ref 0.35–5.50)
Total Bilirubin: 0.6 mg/dL (ref 0.3–1.2)
Urine Glucose: NEGATIVE mg/dL
VLDL: 7 mg/dL (ref 0.0–40.0)

## 2010-06-15 NOTE — Assessment & Plan Note (Signed)
Summary: B12 -JWJ---DAU/ALICE-STC   Nurse Visit   Allergies: 1)  ! Doxycycline Hyclate (Doxycycline Hyclate) 2)  ! Keflex (Cephalexin)  Medication Administration  Injection # 1:    Medication: Vit B12 1000 mcg    Diagnosis: VITAMIN B12 DEFICIENCY (ICD-266.2)    Route: IM    Site: L deltoid    Exp Date: 08/15/2011    Lot #: [id number]    Mfr: [employer]    Patient tolerated injection without complications    Given by: Margaret Pyle, CMA (November 26, 2009 8:44 AM)  Orders Added: 1)  Vit B12 1000 mcg [J3420] 2)  Admin of Therapeutic Inj  intramuscular or subcutaneous [04540]

## 2010-06-15 NOTE — Assessment & Plan Note (Signed)
Summary: B-12 Sammuel Cooper Natale Milch   Nurse Visit   Allergies: 1)  ! Doxycycline Hyclate (Doxycycline Hyclate) 2)  ! Keflex (Cephalexin)  Medication Administration  Injection # 1:    Medication: Vit B12 1000 mcg    Diagnosis: VITAMIN B12 DEFICIENCY (ICD-266.2)    Route: IM    Site: L deltoid    Exp Date: 12/2011    Lot #: [id number]    Mfr: [employer]    Patient tolerated injection without complications    Given by: Zella Ball Ewing CMA (AAMA) (April 12, 2010 1:09 PM)  Orders Added: 1)  Vit B12 1000 mcg [J3420] 2)  Admin of Therapeutic Inj  intramuscular or subcutaneous [69629]

## 2010-06-15 NOTE — Assessment & Plan Note (Signed)
Summary: B12 Sammuel Cooper Natale Milch   Nurse Visit   Allergies: 1)  ! Doxycycline Hyclate (Doxycycline Hyclate) 2)  ! Keflex (Cephalexin)  Medication Administration  Injection # 1:    Medication: Vit B12 1000 mcg    Diagnosis: VITAMIN B12 DEFICIENCY (ICD-266.2)    Route: IM    Site: R deltoid    Exp Date: 11/14/2011    Lot #: [id number]    Mfr: [employer]    Patient tolerated injection without complications    Given by: Lamar Sprinkles, CMA (March 05, 2010 9:35 AM)  Orders Added: 1)  Vit B12 1000 mcg [J3420] 2)  Admin of Therapeutic Inj  intramuscular or subcutaneous [96372]   Medication Administration  Injection # 1:    Medication: Vit B12 1000 mcg    Diagnosis: VITAMIN B12 DEFICIENCY (ICD-266.2)    Route: IM    Site: R deltoid    Exp Date: 11/14/2011    Lot #: [id number]    Mfr: [employer]    Patient tolerated injection without complications    Given by: Lamar Sprinkles, CMA (March 05, 2010 9:35 AM)  Orders Added: 1)  Vit B12 1000 mcg [J3420] 2)  Admin of Therapeutic Inj  intramuscular or subcutaneous [16109]

## 2010-06-15 NOTE — Assessment & Plan Note (Signed)
Summary: b-12 inj/flu-shot/lb   Nurse Visit   Allergies: 1)  ! Doxycycline Hyclate (Doxycycline Hyclate) 2)  ! Keflex (Cephalexin)  Medication Administration  Injection # 1:    Medication: Vit B12 1000 mcg    Diagnosis: VITAMIN B12 DEFICIENCY (ICD-266.2)    Route: IM    Site: R deltoid    Exp Date: 11/14/2011    Lot #: [id number]    Mfr: [employer]    Patient tolerated injection without complications    Given by: Margaret Pyle, CMA (February 04, 2010 8:23 AM)  Orders Added: 1)  Flu Vaccine 66yrs + MEDICARE PATIENTS [Q2039] 2)  Administration Flu vaccine - MCR [G0008] 3)  Admin of Therapeutic Inj  intramuscular or subcutaneous [96372] 4)  Vit B12 1000 mcg [J3420] Flu Vaccine Consent Questions     Do you have a history of severe allergic reactions to this vaccine? no    Any prior history of allergic reactions to egg and/or gelatin? no    Do you have a sensitivity to the preservative Thimersol? no    Do you have a past history of Guillan-Barre Syndrome? no    Do you currently have an acute febrile illness? no    Have you ever had a severe reaction to latex? no    Vaccine information given and explained to patient? yes    Are you currently pregnant? no    Lot Number:[id number]   Exp Date:11/13/2010   Site Given  Left Deltoid IMistration Flu vaccine - MCR [G0008] .lbmedflu

## 2010-06-15 NOTE — Assessment & Plan Note (Signed)
Summary: B12 / Theresa Hill   Nurse Visit   Allergies: 1)  ! Doxycycline Hyclate (Doxycycline Hyclate) 2)  ! Keflex (Cephalexin)  Medication Administration  Injection # 1:    Medication: Vit B12 1000 mcg    Diagnosis: VITAMIN B12 DEFICIENCY (ICD-266.2)    Route: IM    Site: L deltoid    Exp Date: 01/15/2011    Lot #: [id number]    Mfr: [employer]  Orders Added: 1)  Vit B12 1000 mcg [J3420] 2)  Admin of Therapeutic Inj  intramuscular or subcutaneous [96372] 3)  Est. Patient Level I [09811]

## 2010-06-15 NOTE — Assessment & Plan Note (Signed)
Summary: B-12 / Theresa Hill   Nurse Visit   Allergies: 1)  ! Doxycycline Hyclate (Doxycycline Hyclate) 2)  ! Keflex (Cephalexin)  Medication Administration  Injection # 1:    Medication: Vit B12 1000 mcg    Diagnosis: VITAMIN B12 DEFICIENCY (ICD-266.2)    Route: IM    Site: R deltoid    Exp Date: 02/14/2011    Lot #: [id number]    Mfr: [employer]    Patient tolerated injection without complications    Given by: Lucious Groves (August 07, 2009 3:22 PM)  Orders Added: 1)  Admin of Therapeutic Inj  intramuscular or subcutaneous [96372] 2)  Vit B12 1000 mcg [J3420]

## 2010-06-15 NOTE — Letter (Signed)
Summary: [facility]  [facility]   Imported By: Sherian Rein 08/04/2009 10:49:43  _____________________________________________________________________  External Attachment:    Type:   Image     Comment:   External Document

## 2010-06-15 NOTE — Assessment & Plan Note (Signed)
Summary: B12/COMING ABOUT 3:15/Theresa Hill/CD   Nurse Visit   Allergies: 1)  ! Doxycycline Hyclate (Doxycycline Hyclate) 2)  ! Keflex (Cephalexin)  Medication Administration  Injection # 1:    Medication: Vit B12 1000 mcg    Diagnosis: VITAMIN B12 DEFICIENCY (ICD-266.2)    Route: IM    Site: R deltoid    Exp Date: 10/15/2010    Lot #: [id number]    Mfr: [employer]    Patient tolerated injection without complications    Given by: Lucious Groves (June 11, 2009 3:34 PM)  Orders Added: 1)  Admin of Therapeutic Inj  intramuscular or subcutaneous [96372] 2)  Vit B12 1000 mcg [J3420]

## 2010-06-15 NOTE — Assessment & Plan Note (Signed)
Summary: b12/Samari Gorby/cd   Nurse Visit   Allergies: 1)  ! Doxycycline Hyclate (Doxycycline Hyclate) 2)  ! Keflex (Cephalexin)  Medication Administration  Injection # 1:    Medication: Vit B12 1000 mcg    Diagnosis: VITAMIN B12 DEFICIENCY (ICD-266.2)    Route: IM    Site: L deltoid    Exp Date: 04/16/2011    Lot #: [id number]    Mfr: [employer]    Patient tolerated injection without complications    Given by: Margaret Pyle, CMA (September 11, 2009 1:18 PM)  Orders Added: 1)  Vit B12 1000 mcg [J3420] 2)  Admin of Therapeutic Inj  intramuscular or subcutaneous [40102]

## 2010-06-15 NOTE — Assessment & Plan Note (Signed)
Summary: b12/john/cd--coming at 1:30pm - ok dah   Nurse Visit   Allergies: 1)  ! Doxycycline Hyclate (Doxycycline Hyclate) 2)  ! Keflex (Cephalexin)  Medication Administration  Injection # 1:    Medication: Vit B12 1000 mcg    Diagnosis: VITAMIN B12 DEFICIENCY (ICD-266.2)    Route: IM    Site: R deltoid    Exp Date: 08/15/2011    Lot #: [id number]    Mfr: [employer]    Patient tolerated injection without complications    Given by: Margaret Pyle, CMA (January 08, 2010 1:35 PM)  Orders Added: 1)  Admin of Therapeutic Inj  intramuscular or subcutaneous [96372] 2)  Vit B12 1000 mcg [J3420]

## 2010-06-15 NOTE — Assessment & Plan Note (Signed)
Summary: yearly f/u  / medicare / #/ cd   Vital Signs:  Patient profile:   77 year old female Height:      61 inches Weight:      119 pounds BMI:     22.57 O2 Sat:      97 % on Room air Temp:     97.2 degrees F oral Pulse rate:   63 / minute BP sitting:   122 / 62  (left arm) Cuff size:   regular  Vitals Entered ByZella Ball Ewing (Oct 07, 2009 8:35 AM)  O2 Flow:  Room air  CC: Yearly/RE   CC:  Yearly/RE.  History of Present Illness: overall doing well, Pt denies CP, sob, doe, wheezing, orthopnea, pnd, worsening LE edema, palps, dizziness or syncope  Pt denies new neuro symptoms such as headache, facial or extremity weakness  Excellent med complaicne and tolerance.  No new complaints.  Able to get up on exam table with little to no help.    Preventive Screening-Counseling & Management      Drug Use:  no.    Problems Prior to Update: 1)  Preventive Health Care  (ICD-V70.0) 2)  Anemia-nos  (ICD-285.9) 3)  Low Back Pain  (ICD-724.2) 4)  Weight Loss  (ICD-783.21) 5)  COPD  (ICD-496) 6)  Fatigue  (ICD-780.79) 7)  Hyponatremia  (ICD-276.1) 8)  Congestive Heart Failure  (ICD-428.0) 9)  Chest Pain  (ICD-786.50) 10)  Rash-nonvesicular  (ICD-782.1) 11)  Osteoporosis  (ICD-733.00) 12)  Aortic Stenosis  (ICD-424.1) 13)  Left Bundle Branch Block  (ICD-426.3) 14)  Bradycardia  (ICD-427.89) 15)  Syncope  (ICD-780.2) 16)  Hypertension  (ICD-401.9) 17)  Vitamin B12 Deficiency  (ICD-266.2) 18)  Osteopenia  (ICD-733.90) 19)  Hypothyroidism  (ICD-244.9) 20)  Allergic Rhinitis  (ICD-477.9)  Medications Prior to Update: 1)  Ultracet 37.5-325 Mg  Tabs (Tramadol-Acetaminophen) .Marland Kitchen.. 1 - 2 By Mouth Qid As Needed 2)  Vitamin B-12 Cr 1000 Mcg  Tbcr (Cyanocobalamin) .Marland Kitchen.. 1000 Mg Im Q Mo 3)  Adult Aspirin Low Strength 81 Mg  Tbdp (Aspirin) .Marland Kitchen.. 1 Once Daily 4)  Levothyroxine Sodium 88 Mcg Tabs (Levothyroxine Sodium) .Marland Kitchen.. 1 By Mouth Once Daily 5)  Diovan 160 Mg  Tabs (Valsartan) .... 1/2 By  Mouth Qd 6)  Fexofenadine Hcl 180 Mg  Tabs (Fexofenadine Hcl) .Marland Kitchen.. 1 By Mouth Once Daily Prn 7)  Lasix 20 Mg  Tabs (Furosemide) .... 3 Tabs By Mouth Qd 8)  Ferrous Sulfate 325 (65 Fe) Mg  Tabs (Ferrous Sulfate) .Marland Kitchen.. 1 By Mouth Qd 9)  Calcitriol 0.25 Mcg  Caps (Calcitriol) .Marland Kitchen.. 1 By Mouth Once Daily 10)  Omeprazole 20 Mg  Cpdr (Omeprazole) .Marland Kitchen.. 1po Qd 11)  Klor-Con M20 20 Meq  Tbcr (Potassium Chloride Crys Cr) .Marland Kitchen.. 1po Qd 12)  Alendronate Sodium 70 Mg  Tabs (Alendronate Sodium) .Marland Kitchen.. 1po Every Week 13)  Triamcinolone Acetonide 0.1 %  Crea (Triamcinolone Acetonide) .... Two Times A Day 14)  Omeprazole 20 Mg Cpdr (Omeprazole) .Marland Kitchen.. 1 By Mouth Once Daily  Current Medications (verified): 1)  Ultracet 37.5-325 Mg  Tabs (Tramadol-Acetaminophen) .Marland Kitchen.. 1 - 2 By Mouth Qid As Needed 2)  Vitamin B-12 Cr 1000 Mcg  Tbcr (Cyanocobalamin) .Marland Kitchen.. 1000 Mg Im Q Mo 3)  Adult Aspirin Low Strength 81 Mg  Tbdp (Aspirin) .Marland Kitchen.. 1 Once Daily 4)  Levothyroxine Sodium 88 Mcg Tabs (Levothyroxine Sodium) .Marland Kitchen.. 1 By Mouth Once Daily 5)  Diovan 160 Mg  Tabs (Valsartan) .... 1/2 By Mouth  Qd 6)  Fexofenadine Hcl 180 Mg  Tabs (Fexofenadine Hcl) .Marland Kitchen.. 1 By Mouth Once Daily Prn 7)  Lasix 20 Mg  Tabs (Furosemide) .... 3 Tabs By Mouth Qd 8)  Ferrous Sulfate 325 (65 Fe) Mg  Tabs (Ferrous Sulfate) .Marland Kitchen.. 1 By Mouth Qd 9)  Calcitriol 0.25 Mcg  Caps (Calcitriol) .Marland Kitchen.. 1 By Mouth Once Daily 10)  Omeprazole 20 Mg  Cpdr (Omeprazole) .Marland Kitchen.. 1po Qd 11)  Klor-Con M20 20 Meq  Tbcr (Potassium Chloride Crys Cr) .Marland Kitchen.. 1po Qd 12)  Alendronate Sodium 70 Mg  Tabs (Alendronate Sodium) .Marland Kitchen.. 1po Every Week 13)  Triamcinolone Acetonide 0.1 %  Crea (Triamcinolone Acetonide) .... Two Times A Day 14)  Omeprazole 20 Mg Cpdr (Omeprazole) .Marland Kitchen.. 1 By Mouth Once Daily  Allergies (verified): 1)  ! Doxycycline Hyclate (Doxycycline Hyclate) 2)  ! Keflex (Cephalexin)  Past History:  Past Medical History: Last updated: 09/14/2007 Allergic  rhinitis Hypothyroidism Hypertension Microcytic anemia aortic stenosis chronic LBBB Osteoporosis Congestive heart failure/bilat pleural effusions and pericard effusion esophageal dilatation - no achalasia small aspiration on barium swallow 1/09 COPD/chronic bronchitis B12 deficiency Low back pain Anemia-NOS  Past Surgical History: Last updated: 02/23/2007 none  Family History: Last updated: 07/23/2007 sisterand grandmother with thyroid diseae neice with breast cancer neice wtih DM  Social History: Last updated: 10/07/2009 Never Smoked Alcohol use-no lives with daughter Drug use-no  Risk Factors: Smoking Status: never (02/23/2007)  Social History: Reviewed history from 07/23/2007 and no changes required. Never Smoked Alcohol use-no lives with daughter Drug use-no Drug Use:  no  Review of Systems  The patient denies anorexia, fever, vision loss, hoarseness, chest pain, syncope, dyspnea on exertion, peripheral edema, prolonged cough, headaches, hemoptysis, abdominal pain, melena, hematochezia, severe indigestion/heartburn, hematuria, muscle weakness, suspicious skin lesions, transient blindness, depression, unusual weight change, abnormal bleeding, enlarged lymph nodes, and angioedema.         all otherwise negative per pt -    Physical Exam  General:  alert and well-developed.   Head:  normocephalic and atraumatic.   Eyes:  vision grossly intact, pupils equal, and pupils round.   Ears:  R ear normal and L ear normal.   Nose:  no external deformity and no nasal discharge.   Mouth:  no gingival abnormalities and pharynx pink and moist.   Neck:  supple and no masses.   Lungs:  normal respiratory effort and normal breath sounds.   Heart:  normal rate and regular rhythm.   Abdomen:  soft, non-tender, and normal bowel sounds.   Msk:  no joint tenderness and no joint swelling.   Extremities:  no edema, no erythema  Neurologic:  cranial nerves II-XII intact and  strength normal in all extremities.   Skin:  color normal and no rashes.   Psych:  not anxious appearing and not depressed appearing.     Impression & Recommendations:  Problem # 1:  Preventive Health Care (ICD-V70.0)  Overall doing well, age appropriate education and counseling updated and referral for appropriate preventive services done unless declined, immunizations up to date or declined, diet counseling done if overweight, urged to quit smoking if smokes , most recent labs reviewed and current ordered if appropriate, ecg reviewed or declined (interpretation per ECG scanned in the EMR if done); information regarding Medicare Prevention requirements given if appropriate; speciality referrals updated as appropriate   Orders: EKG w/ Interpretation (93000) TLB-BMP (Basic Metabolic Panel-BMET) (80048-METABOL) TLB-CBC Platelet - w/Differential (85025-CBCD) TLB-Hepatic/Liver Function Pnl (80076-HEPATIC) TLB-TSH (Thyroid Stimulating Hormone) (84443-TSH)  TLB-Lipid Panel (80061-LIPID) TLB-Udip ONLY (81003-UDIP)  Complete Medication List: 1)  Ultracet 37.5-325 Mg Tabs (Tramadol-acetaminophen) .Marland Kitchen.. 1 - 2 by mouth qid as needed 2)  Vitamin B-12 Cr 1000 Mcg Tbcr (Cyanocobalamin) .Marland Kitchen.. 1000 mg im q mo 3)  Adult Aspirin Low Strength 81 Mg Tbdp (Aspirin) .Marland Kitchen.. 1 once daily 4)  Levothyroxine Sodium 88 Mcg Tabs (Levothyroxine sodium) .Marland Kitchen.. 1 by mouth once daily 5)  Diovan 160 Mg Tabs (Valsartan) .... 1/2 by mouth qd 6)  Fexofenadine Hcl 180 Mg Tabs (Fexofenadine hcl) .Marland Kitchen.. 1 by mouth once daily prn 7)  Lasix 20 Mg Tabs (Furosemide) .... 3 tabs by mouth qd 8)  Ferrous Sulfate 325 (65 Fe) Mg Tabs (Ferrous sulfate) .Marland Kitchen.. 1 by mouth qd 9)  Calcitriol 0.25 Mcg Caps (Calcitriol) .Marland Kitchen.. 1 by mouth once daily 10)  Omeprazole 20 Mg Cpdr (Omeprazole) .Marland Kitchen.. 1po qd 11)  Klor-con M20 20 Meq Tbcr (Potassium chloride crys cr) .Marland Kitchen.. 1po qd 12)  Alendronate Sodium 70 Mg Tabs (Alendronate sodium) .Marland Kitchen.. 1po every week 13)   Triamcinolone Acetonide 0.1 % Crea (Triamcinolone acetonide) .... Two times a day 14)  Omeprazole 20 Mg Cpdr (Omeprazole) .Marland Kitchen.. 1 by mouth once daily  Other Orders: Vit B12 1000 mcg (J3420) Admin of Therapeutic Inj  intramuscular or subcutaneous (16109) T-Vitamin D (25-Hydroxy) (60454-09811) T-Parathyroid Hormone, Intact (91478-29562) Zoster (Shingles) Vaccine Live (13086) Admin 1st Vaccine (57846)  Patient Instructions: 1)  you had the B12 shot today 2)  Please go to the Lab in the basement for your blood and/or urine tests today 3)  Continue all previous medications as before this visit  4)  you had the shingles shot today 5)  Please schedule a follow-up appointment in 6 months.   Medication Administration  Injection # 1:    Medication: Vit B12 1000 mcg    Diagnosis: VITAMIN B12 DEFICIENCY (ICD-266.2)    Route: IM    Site: R deltoid    Exp Date: 05/2011    Lot #: [id number]    Mfr: [employer]    Given by: Zella Ball Ewing (Oct 07, 2009 8:36 AM)  Orders Added: 1)  Vit B12 1000 mcg [J3420] 2)  Admin of Therapeutic Inj  intramuscular or subcutaneous [96372] 3)  EKG w/ Interpretation [93000] 4)  T-Vitamin D (25-Hydroxy) [96295-28413] 5)  T-Parathyroid Hormone, Intact [24401-02725] 6)  Zoster (Shingles) Vaccine Live [90736] 7)  Admin 1st Vaccine [90471] 8)  TLB-BMP (Basic Metabolic Panel-BMET) [80048-METABOL] 9)  TLB-CBC Platelet - w/Differential [85025-CBCD] 10)  TLB-Hepatic/Liver Function Pnl [80076-HEPATIC] 11)  TLB-TSH (Thyroid Stimulating Hormone) [84443-TSH] 12)  TLB-Lipid Panel [80061-LIPID] 13)  TLB-Udip ONLY [81003-UDIP] 14)  Est. Patient 65& > [36644]    Immunizations Administered:  Zostavax # 0:    Vaccine Type: Zostavax    Site: left deltoid    Mfr: [employer]    Dose: 0.5 ml    Route: O'Brien    Given by: Robin Ewing    Exp. Date: 09/20/2010    Lot #: [id number]    VIS given: 02/25/05 given Oct 07, 2009.

## 2010-06-17 NOTE — Assessment & Plan Note (Signed)
Summary: b12 shot/john/cd   Nurse Visit   Allergies: 1)  ! Doxycycline Hyclate (Doxycycline Hyclate) 2)  ! Keflex (Cephalexin)  Medication Administration  Injection # 1:    Medication: Vit B12 1000 mcg    Diagnosis: VITAMIN B12 DEFICIENCY (ICD-266.2)    Route: IM    Site: L deltoid    Exp Date: 01/2012    Lot #: [id number]    Mfr: [employer]    Patient tolerated injection without complications    Given by: Orlan Leavens RMA (June 04, 2010 9:17 AM)  Orders Added: 1)  Vit B12 1000 mcg [J3420] 2)  Admin of Therapeutic Inj  intramuscular or subcutaneous [45409]

## 2010-06-17 NOTE — Assessment & Plan Note (Signed)
Summary: PER DAHLIA DOUBLEBOOK--PER DAU B12--JWJ--STC   Nurse Visit   Allergies: 1)  ! Doxycycline Hyclate (Doxycycline Hyclate) 2)  ! Keflex (Cephalexin)  Medication Administration  Injection # 1:    Medication: Vit B12 1000 mcg    Diagnosis: VITAMIN B12 DEFICIENCY (ICD-266.2)    Route: IM    Site: R deltoid    Exp Date: 11/14/2011    Lot #: [id number]    Mfr: [employer]    Patient tolerated injection without complications    Given by: Margaret Pyle, CMA (May 14, 2010 8:31 AM)  Orders Added: 1)  Admin of Therapeutic Inj  intramuscular or subcutaneous [96372] 2)  Vit B12 1000 mcg [J3420]

## 2010-06-18 ENCOUNTER — Telehealth: Payer: Self-pay | Admitting: Internal Medicine

## 2010-06-18 ENCOUNTER — Encounter (HOSPITAL_BASED_OUTPATIENT_CLINIC_OR_DEPARTMENT_OTHER): Payer: Medicare Other | Attending: General Surgery

## 2010-06-18 DIAGNOSIS — I872 Venous insufficiency (chronic) (peripheral): Secondary | ICD-10-CM | POA: Insufficient documentation

## 2010-06-18 DIAGNOSIS — L97309 Non-pressure chronic ulcer of unspecified ankle with unspecified severity: Secondary | ICD-10-CM | POA: Insufficient documentation

## 2010-06-23 NOTE — Progress Notes (Signed)
Summary: MED ALLERGY  Phone Note Call from Patient   Summary of Call: Daughter left vm req names of meds pt has allergy to - gave daughter info. Wound center rx's doxycycline - advised she not start med and call oncall MD at wound center for alt med.  Initial call taken by: Lamar Sprinkles, CMA,  June 18, 2010 5:40 PM

## 2010-07-02 ENCOUNTER — Ambulatory Visit (INDEPENDENT_AMBULATORY_CARE_PROVIDER_SITE_OTHER): Payer: Medicare Other

## 2010-07-02 ENCOUNTER — Encounter: Payer: Self-pay | Admitting: Internal Medicine

## 2010-07-02 DIAGNOSIS — E538 Deficiency of other specified B group vitamins: Secondary | ICD-10-CM

## 2010-07-07 NOTE — Assessment & Plan Note (Signed)
Summary: b12 shot/john--pt coming at 1pm   Nurse Visit   Allergies: 1)  ! Doxycycline Hyclate (Doxycycline Hyclate) 2)  ! Keflex (Cephalexin)  Medication Administration  Injection # 1:    Medication: Vit B12 1000 mcg    Diagnosis: VITAMIN B12 DEFICIENCY (ICD-266.2)    Route: IM    Site: R deltoid    Exp Date: 03/16/2012    Lot #: [id number]    Mfr: [employer]    Patient tolerated injection without complications    Given by: Margaret Pyle, CMA (July 02, 2010 1:18 PM)  Orders Added: 1)  Admin of Therapeutic Inj  intramuscular or subcutaneous [96372] 2)  Vit B12 1000 mcg [J3420]

## 2010-07-23 ENCOUNTER — Encounter (HOSPITAL_BASED_OUTPATIENT_CLINIC_OR_DEPARTMENT_OTHER): Payer: Medicare Other | Attending: General Surgery

## 2010-07-23 DIAGNOSIS — L97309 Non-pressure chronic ulcer of unspecified ankle with unspecified severity: Secondary | ICD-10-CM | POA: Insufficient documentation

## 2010-07-23 DIAGNOSIS — Z79899 Other long term (current) drug therapy: Secondary | ICD-10-CM | POA: Insufficient documentation

## 2010-07-23 DIAGNOSIS — Z7982 Long term (current) use of aspirin: Secondary | ICD-10-CM | POA: Insufficient documentation

## 2010-07-30 ENCOUNTER — Ambulatory Visit (INDEPENDENT_AMBULATORY_CARE_PROVIDER_SITE_OTHER): Payer: Medicare Other | Admitting: Internal Medicine

## 2010-07-30 ENCOUNTER — Ambulatory Visit (INDEPENDENT_AMBULATORY_CARE_PROVIDER_SITE_OTHER)
Admission: RE | Admit: 2010-07-30 | Discharge: 2010-07-30 | Disposition: A | Discharge status: Home / Self Care | Payer: Medicare Other | Source: Ambulatory Visit | Attending: Internal Medicine | Admitting: Internal Medicine

## 2010-07-30 ENCOUNTER — Other Ambulatory Visit: Payer: Self-pay | Admitting: Internal Medicine

## 2010-07-30 ENCOUNTER — Other Ambulatory Visit: Payer: Medicare Other

## 2010-07-30 ENCOUNTER — Encounter: Payer: Self-pay | Admitting: Internal Medicine

## 2010-07-30 DIAGNOSIS — J209 Acute bronchitis, unspecified: Secondary | ICD-10-CM

## 2010-07-30 DIAGNOSIS — I10 Essential (primary) hypertension: Secondary | ICD-10-CM

## 2010-07-30 DIAGNOSIS — E538 Deficiency of other specified B group vitamins: Secondary | ICD-10-CM

## 2010-07-30 DIAGNOSIS — R5383 Other fatigue: Secondary | ICD-10-CM

## 2010-07-30 DIAGNOSIS — R062 Wheezing: Secondary | ICD-10-CM | POA: Insufficient documentation

## 2010-07-30 DIAGNOSIS — R5381 Other malaise: Secondary | ICD-10-CM

## 2010-07-30 LAB — HEPATIC FUNCTION PANEL
ALT: 13 U/L (ref 0–35)
AST: 22 U/L (ref 0–37)
Albumin: 4.4 g/dL (ref 3.5–5.2)
Alkaline Phosphatase: 92 U/L (ref 39–117)
Bilirubin, Direct: 0.1 mg/dL (ref 0.0–0.3)
Total Bilirubin: 0.7 mg/dL (ref 0.3–1.2)
Total Protein: 7.2 g/dL (ref 6.0–8.3)

## 2010-07-30 LAB — CBC WITH DIFFERENTIAL/PLATELET
Basophils Relative: 0.6 % (ref 0.0–3.0)
Eosinophils Relative: 1.2 % (ref 0.0–5.0)
HCT: 30.9 % — ABNORMAL LOW (ref 36.0–46.0)
Hemoglobin: 10.6 g/dL — ABNORMAL LOW (ref 12.0–15.0)
Lymphs Abs: 1.3 10*3/uL (ref 0.7–4.0)
Monocytes Relative: 13 % — ABNORMAL HIGH (ref 3.0–12.0)
Neutro Abs: 5.9 10*3/uL (ref 1.4–7.7)
RBC: 2.89 Mil/uL — ABNORMAL LOW (ref 3.87–5.11)
RDW: 17.9 % — ABNORMAL HIGH (ref 11.5–14.6)
WBC: 8.4 10*3/uL (ref 4.5–10.5)

## 2010-07-30 LAB — URINALYSIS, ROUTINE W REFLEX MICROSCOPIC
Bilirubin Urine: NEGATIVE
Hgb urine dipstick: NEGATIVE
Ketones, ur: NEGATIVE
Leukocytes, UA: NEGATIVE
Nitrite: NEGATIVE
Specific Gravity, Urine: 1.01 (ref 1.000–1.030)
Total Protein, Urine: NEGATIVE
Urine Glucose: NEGATIVE
Urobilinogen, UA: 0.2 (ref 0.0–1.0)
pH: 6.5 (ref 5.0–8.0)

## 2010-07-30 LAB — BASIC METABOLIC PANEL
GFR: 47.34 mL/min — ABNORMAL LOW (ref >60.00)
Glucose, Bld: 76 mg/dL (ref 70–99)
Potassium: 5.3 mEq/L — ABNORMAL HIGH (ref 3.5–5.1)
Sodium: 131 mEq/L — ABNORMAL LOW (ref 135–145)

## 2010-07-30 LAB — TSH: TSH: 28.29 u[IU]/mL — ABNORMAL HIGH (ref 0.35–5.50)

## 2010-08-03 NOTE — Assessment & Plan Note (Signed)
Summary: FU AND B12 INJ /NWS   Vital Signs:  Patient profile:   77 year old female Height:      60 inches Weight:      129.13 pounds BMI:     25.31 O2 Sat:      97 % on Room air Temp:     97.7 degrees F oral Pulse rate:   56 / minute BP sitting:   110 / 68  (left arm) Cuff size:   regular  Vitals Entered By: Zella Ball Ewing CMA (AAMA) (July 30, 2010 1:50 PM)  O2 Flow:  Room air  CC: Wheezing and cough, B-12/RE   CC:  Wheezing and cough and B-12/RE.  History of Present Illness: here to f/u with daughter; overall doing well but does have mild low grade temp, fatigue, hoarseness, prod cough and wheezes for the last 3 days;  no change in confusion and no falls;  ; Pt denies CP, worsening sob, doe, wheezing, orthopnea, pnd, worsening LE edema, palps, dizziness or syncope  Pt denies new neuro symptoms such as headache, facial or extremity weakness  Pt denies polydipsia, polyuria   Overall good compliance with meds, trying to follow low chol  diet, wt stable, walks mostly with cane,  no falls;  wounds now healed;  stands up from sitting ok wihtout high risk of fall, has cane, walker at home.  No recent wt loss, night sweats, loss of appetite or other constitutional symptoms   Problems Prior to Update: 1)  Fatigue  (ICD-780.79) 2)  Wheezing  (ICD-786.07) 3)  Bronchitis-acute  (ICD-466.0) 4)  Preventive Health Care  (ICD-V70.0) 5)  Anemia-nos  (ICD-285.9) 6)  Low Back Pain  (ICD-724.2) 7)  Weight Loss  (ICD-783.21) 8)  COPD  (ICD-496) 9)  Fatigue  (ICD-780.79) 10)  Hyponatremia  (ICD-276.1) 11)  Congestive Heart Failure  (ICD-428.0) 12)  Chest Pain  (ICD-786.50) 13)  Rash-nonvesicular  (ICD-782.1) 14)  Osteoporosis  (ICD-733.00) 15)  Aortic Stenosis  (ICD-424.1) 16)  Left Bundle Branch Block  (ICD-426.3) 17)  Bradycardia  (ICD-427.89) 18)  Syncope  (ICD-780.2) 19)  Hypertension  (ICD-401.9) 20)  Vitamin B12 Deficiency  (ICD-266.2) 21)  Osteopenia  (ICD-733.90) 22)  Hypothyroidism   (ICD-244.9) 23)  Allergic Rhinitis  (ICD-477.9)  Medications Prior to Update: 1)  Ultracet 37.5-325 Mg  Tabs (Tramadol-Acetaminophen) .Marland Kitchen.. 1 - 2 By Mouth Qid As Needed 2)  Vitamin B-12 Cr 1000 Mcg  Tbcr (Cyanocobalamin) .Marland Kitchen.. 1000 Mg Im Q Mo 3)  Adult Aspirin Low Strength 81 Mg  Tbdp (Aspirin) .Marland Kitchen.. 1 Once Daily 4)  Levothyroxine Sodium 50 Mcg Tabs (Levothyroxine Sodium) .Marland Kitchen.. 1po Once Daily 5)  Diovan 160 Mg  Tabs (Valsartan) .... 1/2 By Mouth Qd 6)  Fexofenadine Hcl 180 Mg  Tabs (Fexofenadine Hcl) .Marland Kitchen.. 1 By Mouth Once Daily Prn 7)  Lasix 20 Mg  Tabs (Furosemide) .... 3 Tabs By Mouth Qd 8)  Ferrous Sulfate 325 (65 Fe) Mg  Tabs (Ferrous Sulfate) .Marland Kitchen.. 1 By Mouth Qd 9)  Calcitriol 0.25 Mcg  Caps (Calcitriol) .Marland Kitchen.. 1 By Mouth Once Daily 10)  Omeprazole 20 Mg  Cpdr (Omeprazole) .Marland Kitchen.. 1po Qd 11)  Klor-Con M20 20 Meq  Tbcr (Potassium Chloride Crys Cr) .Marland Kitchen.. 1po Qd 12)  Alendronate Sodium 70 Mg  Tabs (Alendronate Sodium) .Marland Kitchen.. 1po Every Week 13)  Triamcinolone Acetonide 0.1 %  Crea (Triamcinolone Acetonide) .... Two Times A Day 14)  Omeprazole 20 Mg Cpdr (Omeprazole) .Marland Kitchen.. 1 By Mouth Once Daily  Current Medications (verified):  1)  Ultracet 37.5-325 Mg  Tabs (Tramadol-Acetaminophen) .Marland Kitchen.. 1 - 2 By Mouth Qid As Needed 2)  Vitamin B-12 Cr 1000 Mcg  Tbcr (Cyanocobalamin) .Marland Kitchen.. 1000 Mg Im Q Mo 3)  Adult Aspirin Low Strength 81 Mg  Tbdp (Aspirin) .Marland Kitchen.. 1 Once Daily 4)  Levothyroxine Sodium 50 Mcg Tabs (Levothyroxine Sodium) .Marland Kitchen.. 1po Once Daily 5)  Diovan 160 Mg  Tabs (Valsartan) .... 1/2 By Mouth Qd 6)  Fexofenadine Hcl 180 Mg  Tabs (Fexofenadine Hcl) .Marland Kitchen.. 1 By Mouth Once Daily Prn 7)  Lasix 20 Mg  Tabs (Furosemide) .... 3 Tabs By Mouth Qd 8)  Ferrous Sulfate 325 (65 Fe) Mg  Tabs (Ferrous Sulfate) .Marland Kitchen.. 1 By Mouth Qd 9)  Calcitriol 0.25 Mcg  Caps (Calcitriol) .Marland Kitchen.. 1 By Mouth Once Daily 10)  Omeprazole 20 Mg  Cpdr (Omeprazole) .Marland Kitchen.. 1po Qd 11)  Klor-Con M20 20 Meq  Tbcr (Potassium Chloride Crys Cr) .Marland Kitchen.. 1po  Qd 12)  Alendronate Sodium 70 Mg  Tabs (Alendronate Sodium) .Marland Kitchen.. 1po Every Week 13)  Triamcinolone Acetonide 0.1 %  Crea (Triamcinolone Acetonide) .... Two Times A Day 14)  Azithromycin 250 Mg Tabs (Azithromycin) .... 2po Qd For 1 Day, Then 1po Qd For 4days, Then Stop 15)  Prednisone 10 Mg Tabs (Prednisone) .Marland Kitchen.. 1 By Mouth Once Daily For 5 Days  Allergies (verified): 1)  ! Doxycycline Hyclate (Doxycycline Hyclate) 2)  ! Keflex (Cephalexin) 3)  ! Septra  Past History:  Past Surgical History: Last updated: 02/23/2007 none  Social History: Last updated: 10/07/2009 Never Smoked Alcohol use-no lives with daughter Drug use-no  Risk Factors: Smoking Status: never (02/23/2007)  Past Medical History: Allergic rhinitis Hypothyroidism Hypertension Microcytic anemia aortic stenosis chronic LBBB Osteoporosis Congestive heart failure/bilat pleural effusions and pericard effusion esophageal dilatation - no achalasia small aspiration on barium swallow 1/09 COPD/chronic bronchitis B12 deficiency Low back pain Anemia-NOS  Review of Systems       all otherwise negative per pt -    Physical Exam  General:  alert and overweight-appearing., mild ill  Head:  normocephalic and atraumatic.   Eyes:  vision grossly intact, pupils equal, and pupils round.   Ears:  bilat tm's red, sinus nontender Nose:  nasal dischargemucosal pallor and mucosal edema.   Mouth:  pharyngeal erythema and fair dentition.   Neck:  supple and no masses.   Lungs:  normal respiratory effort, R decreased breath sounds, R wheezes, L decreased breath sounds, and L wheezes.   Heart:  normal rate and regular rhythm.   Extremities:  no edema, no erythema    Impression & Recommendations:  Problem # 1:  BRONCHITIS-ACUTE (ICD-466.0)  Her updated medication list for this problem includes:    Azithromycin 250 Mg Tabs (Azithromycin) .Marland Kitchen... 2po qd for 1 day, then 1po qd for 4days, then stop  Orders: T-2 View CXR,  Same Day (71020.5TC) treat as above, f/u any worsening signs or symptoms   Take antibiotics and other medications as directed. Encouraged to push clear liquids, get enough rest, and take acetaminophen as needed. To be seen in 5-7 days if no improvement, sooner if worse.  Problem # 2:  WHEEZING (ICD-786.07) suspect underlying midl COPD type exacerbation - for very careful low dose prednisone course for 5 days only; also for cxr today given hx of chf but I supsect her volume is ok  Problem # 3:  FATIGUE (ICD-780.79) exam benign, to check labs below; follow with expectant management  Orders: TLB-BMP (Basic Metabolic Panel-BMET) (80048-METABOL) TLB-CBC  Platelet - w/Differential (85025-CBCD) TLB-Hepatic/Liver Function Pnl (80076-HEPATIC) TLB-TSH (Thyroid Stimulating Hormone) (84443-TSH) TLB-Udip w/ Micro (81001-URINE)  Problem # 4:  HYPERTENSION (ICD-401.9)  Her updated medication list for this problem includes:    Diovan 160 Mg Tabs (Valsartan) .Marland Kitchen... 1/2 by mouth qd    Lasix 20 Mg Tabs (Furosemide) .Marland KitchenMarland KitchenMarland KitchenMarland Kitchen 3 tabs by mouth qd  BP today: 110/68 Prior BP: 122/62 (10/07/2009)  Labs Reviewed: K+: 4.7 (10/07/2009) Creat: : 1.2 (10/07/2009)   Chol: 158 (10/07/2009)   HDL: 84.00 (10/07/2009)   LDL: 67 (10/07/2009)   TG: 35.0 (10/07/2009) stable overall by hx and exam, ok to continue meds/tx as is   Complete Medication List: 1)  Ultracet 37.5-325 Mg Tabs (Tramadol-acetaminophen) .Marland Kitchen.. 1 - 2 by mouth qid as needed 2)  Vitamin B-12 Cr 1000 Mcg Tbcr (Cyanocobalamin) .Marland Kitchen.. 1000 mg im q mo 3)  Adult Aspirin Low Strength 81 Mg Tbdp (Aspirin) .Marland Kitchen.. 1 once daily 4)  Levothyroxine Sodium 50 Mcg Tabs (Levothyroxine sodium) .Marland Kitchen.. 1po once daily 5)  Diovan 160 Mg Tabs (Valsartan) .... 1/2 by mouth qd 6)  Fexofenadine Hcl 180 Mg Tabs (Fexofenadine hcl) .Marland Kitchen.. 1 by mouth once daily prn 7)  Lasix 20 Mg Tabs (Furosemide) .... 3 tabs by mouth qd 8)  Ferrous Sulfate 325 (65 Fe) Mg Tabs (Ferrous sulfate) .Marland Kitchen.. 1 by  mouth qd 9)  Calcitriol 0.25 Mcg Caps (Calcitriol) .Marland Kitchen.. 1 by mouth once daily 10)  Omeprazole 20 Mg Cpdr (Omeprazole) .Marland Kitchen.. 1po qd 11)  Klor-con M20 20 Meq Tbcr (Potassium chloride crys cr) .Marland Kitchen.. 1po qd 12)  Alendronate Sodium 70 Mg Tabs (Alendronate sodium) .Marland Kitchen.. 1po every week 13)  Triamcinolone Acetonide 0.1 % Crea (Triamcinolone acetonide) .... Two times a day 14)  Azithromycin 250 Mg Tabs (Azithromycin) .... 2po qd for 1 day, then 1po qd for 4days, then stop 15)  Prednisone 10 Mg Tabs (Prednisone) .Marland Kitchen.. 1 by mouth once daily for 5 days  Other Orders: Vit B12 1000 mcg (J3420) Admin of Therapeutic Inj  intramuscular or subcutaneous (16109)  Patient Instructions: 1)  Please take all new medications as prescribed 2)  Continue all previous medications as before this visit  3)   Please go to Radiology in the basement level for your X-Ray today  4)  Please go to the Lab in the basement for your blood and/or urine tests today 5)  Please call the number on the [facility] Card for results of your testing  6)  Please schedule a follow-up appointment in 6 months or sooner if needed Prescriptions: PREDNISONE 10 MG TABS (PREDNISONE) 1 by mouth once daily for 5 days  #5 x 0   Entered and Authorized by:   Corwin Levins MD   Signed by:   Corwin Levins MD on 07/30/2010   Method used:   Print then Give to Patient   RxID:   [id number] AZITHROMYCIN 250 MG TABS (AZITHROMYCIN) 2po qd for 1 day, then 1po qd for 4days, then stop  #6 x 1   Entered and Authorized by:   Corwin Levins MD   Signed by:   Corwin Levins MD on 07/30/2010   Method used:   Print then Give to Patient   RxID:   [id number]    Medication Administration  Injection # 1:    Medication: Vit B12 1000 mcg    Diagnosis: VITAMIN B12 DEFICIENCY (ICD-266.2)    Route: IM    Site: L deltoid    Exp Date: 03/2012  Lot #: [id number]    Mfr: American Regent    Patient tolerated injection without complications    Given by: Zella Ball Ewing CMA (AAMA)  (July 30, 2010 1:51 PM)  Orders Added: 1)  Vit B12 1000 mcg [J3420] 2)  Admin of Therapeutic Inj  intramuscular or subcutaneous [96372] 3)  T-2 View CXR, Same Day [71020.5TC] 4)  TLB-BMP (Basic Metabolic Panel-BMET) [80048-METABOL] 5)  TLB-CBC Platelet - w/Differential [85025-CBCD] 6)  TLB-Hepatic/Liver Function Pnl [80076-HEPATIC] 7)  TLB-TSH (Thyroid Stimulating Hormone) [84443-TSH] 8)  TLB-Udip w/ Micro [81001-URINE] 9)  Est. Patient Level IV [69629]

## 2010-08-18 LAB — CULTURE, BLOOD (ROUTINE X 2): Culture: NO GROWTH

## 2010-08-18 LAB — TSH: TSH: 1.937 u[IU]/mL (ref 0.350–4.500)

## 2010-08-18 LAB — CBC
HCT: 25.8 % — ABNORMAL LOW (ref 36.0–46.0)
HCT: 26.9 % — ABNORMAL LOW (ref 36.0–46.0)
HCT: 27.2 % — ABNORMAL LOW (ref 36.0–46.0)
Hemoglobin: 8.8 g/dL — ABNORMAL LOW (ref 12.0–15.0)
Hemoglobin: 8.9 g/dL — ABNORMAL LOW (ref 12.0–15.0)
Hemoglobin: 9.1 g/dL — ABNORMAL LOW (ref 12.0–15.0)
Hemoglobin: 9.9 g/dL — ABNORMAL LOW (ref 12.0–15.0)
MCHC: 33 g/dL (ref 30.0–36.0)
MCHC: 33.2 g/dL (ref 30.0–36.0)
MCV: 106 fL — ABNORMAL HIGH (ref 78.0–100.0)
MCV: 106.9 fL — ABNORMAL HIGH (ref 78.0–100.0)
Platelets: 230 10*3/uL (ref 150–400)
Platelets: 231 10*3/uL (ref 150–400)
RBC: 2.43 MIL/uL — ABNORMAL LOW (ref 3.87–5.11)
RDW: 15.4 % (ref 11.5–15.5)
RDW: 16.2 % — ABNORMAL HIGH (ref 11.5–15.5)
RDW: 16.3 % — ABNORMAL HIGH (ref 11.5–15.5)
RDW: 16.6 % — ABNORMAL HIGH (ref 11.5–15.5)
WBC: 11.3 10*3/uL — ABNORMAL HIGH (ref 4.0–10.5)

## 2010-08-18 LAB — URINALYSIS, ROUTINE W REFLEX MICROSCOPIC
Glucose, UA: NEGATIVE mg/dL
Hgb urine dipstick: NEGATIVE
Ketones, ur: NEGATIVE mg/dL
Protein, ur: NEGATIVE mg/dL
pH: 7 (ref 5.0–8.0)

## 2010-08-18 LAB — BASIC METABOLIC PANEL
BUN: 12 mg/dL (ref 6–23)
BUN: 18 mg/dL (ref 6–23)
CO2: 27 mEq/L (ref 19–32)
Chloride: 103 mEq/L (ref 96–112)
Chloride: 104 mEq/L (ref 96–112)
GFR calc Af Amer: 60 mL/min — ABNORMAL LOW (ref >60)
GFR calc non Af Amer: 49 mL/min — ABNORMAL LOW (ref >60)
GFR calc non Af Amer: 51 mL/min — ABNORMAL LOW (ref >60)
Glucose, Bld: 87 mg/dL (ref 70–99)
Glucose, Bld: 87 mg/dL (ref 70–99)
Glucose, Bld: 89 mg/dL (ref 70–99)
Potassium: 3.4 mEq/L — ABNORMAL LOW (ref 3.5–5.1)
Potassium: 3.9 mEq/L (ref 3.5–5.1)
Potassium: 4.1 mEq/L (ref 3.5–5.1)
Sodium: 138 mEq/L (ref 135–145)
Sodium: 138 mEq/L (ref 135–145)
Sodium: 139 mEq/L (ref 135–145)

## 2010-08-18 LAB — COMPREHENSIVE METABOLIC PANEL
ALT: 23 U/L (ref 0–35)
AST: 31 U/L (ref 0–37)
Albumin: 3.4 g/dL — ABNORMAL LOW (ref 3.5–5.2)
Alkaline Phosphatase: 79 U/L (ref 39–117)
Calcium: 9.7 mg/dL (ref 8.4–10.5)
GFR calc Af Amer: 49 mL/min — ABNORMAL LOW (ref >60)
Glucose, Bld: 95 mg/dL (ref 70–99)
Potassium: 4.4 mEq/L (ref 3.5–5.1)
Sodium: 136 mEq/L (ref 135–145)
Total Protein: 7 g/dL (ref 6.0–8.3)

## 2010-08-18 LAB — WOUND CULTURE

## 2010-08-18 LAB — URINE CULTURE
Colony Count: NO GROWTH
Culture: NO GROWTH

## 2010-08-18 LAB — URINE MICROSCOPIC-ADD ON

## 2010-08-18 LAB — DIFFERENTIAL: Basophils Absolute: 0 10*3/uL (ref 0.0–0.1)

## 2010-08-18 LAB — LACTIC ACID, PLASMA: Lactic Acid, Venous: 1 mmol/L (ref 0.5–2.2)

## 2010-09-03 ENCOUNTER — Ambulatory Visit: Payer: Medicare Other | Admitting: Internal Medicine

## 2010-09-03 ENCOUNTER — Other Ambulatory Visit: Payer: Self-pay | Admitting: Internal Medicine

## 2010-09-03 ENCOUNTER — Other Ambulatory Visit (INDEPENDENT_AMBULATORY_CARE_PROVIDER_SITE_OTHER): Payer: Medicare Other

## 2010-09-03 ENCOUNTER — Ambulatory Visit (INDEPENDENT_AMBULATORY_CARE_PROVIDER_SITE_OTHER): Payer: Medicare Other | Admitting: *Deleted

## 2010-09-03 ENCOUNTER — Encounter (HOSPITAL_BASED_OUTPATIENT_CLINIC_OR_DEPARTMENT_OTHER): Payer: Medicare Other | Attending: General Surgery

## 2010-09-03 DIAGNOSIS — Z79899 Other long term (current) drug therapy: Secondary | ICD-10-CM | POA: Insufficient documentation

## 2010-09-03 DIAGNOSIS — Z7982 Long term (current) use of aspirin: Secondary | ICD-10-CM | POA: Insufficient documentation

## 2010-09-03 DIAGNOSIS — L97309 Non-pressure chronic ulcer of unspecified ankle with unspecified severity: Secondary | ICD-10-CM | POA: Insufficient documentation

## 2010-09-03 DIAGNOSIS — E038 Other specified hypothyroidism: Secondary | ICD-10-CM

## 2010-09-03 DIAGNOSIS — E538 Deficiency of other specified B group vitamins: Secondary | ICD-10-CM

## 2010-09-03 MED ORDER — CYANOCOBALAMIN 1000 MCG/ML IJ SOLN
1000.0000 ug | Freq: Once | INTRAMUSCULAR | Status: AC
  Administered 2010-09-03: 1000 ug via INTRAMUSCULAR

## 2010-09-03 NOTE — Progress Notes (Signed)
Quick Note:  Voice message left on [employer] system - lab is negative, normal or otherwise stable, pt to continue same tx ______ 

## 2010-09-24 ENCOUNTER — Encounter (HOSPITAL_BASED_OUTPATIENT_CLINIC_OR_DEPARTMENT_OTHER): Payer: Medicare Other | Attending: General Surgery

## 2010-09-24 DIAGNOSIS — Z79899 Other long term (current) drug therapy: Secondary | ICD-10-CM | POA: Insufficient documentation

## 2010-09-24 DIAGNOSIS — L97309 Non-pressure chronic ulcer of unspecified ankle with unspecified severity: Secondary | ICD-10-CM | POA: Insufficient documentation

## 2010-09-24 DIAGNOSIS — G589 Mononeuropathy, unspecified: Secondary | ICD-10-CM | POA: Insufficient documentation

## 2010-09-27 ENCOUNTER — Encounter: Payer: Self-pay | Admitting: Internal Medicine

## 2010-09-27 DIAGNOSIS — Z0000 Encounter for general adult medical examination without abnormal findings: Secondary | ICD-10-CM | POA: Insufficient documentation

## 2010-09-28 NOTE — Discharge Summary (Signed)
NAME:  Gehret, Audi                 ACCOUNT NO.:  [id number]   MEDICAL RECORD NO.:  [id number]          PATIENT TYPE:  INP   LOCATION:  1334                         FACILITY:  [facility]   PHYSICIAN:  Valerie A. Felicity Coyer, MDDATE OF BIRTH:  12/29/1915   DATE OF ADMISSION:  04/30/2007  DATE OF DISCHARGE:  05/09/2007                               DISCHARGE SUMMARY   DISCHARGE DIAGNOSES:  1. Intractable low back pain at admission due to sacral fracture      nondisplaced.  Pain management with Duragesic, now stable for home      health PT.  Activity as tolerated.  2. Traumatic osteoporotic fracture, with history of fall.  See above.      Also multiple old and subacute vertebral fractures.  3. Osteoporosis, with newly diagnosed mild vitamin D deficiency.  See      treatment below.  4. Esophageal dilation, incidental on MRI findings December 17.      Status post EGD December 19, with esophagitis.  Continue PPI.  5. Question left kidney mass on MRI of December 17, not seen on follow-      up CT of kidneys December 18.  No further eval.  6. Chronic obstructive pulmonary disease, with chronic bronchitis      symptoms.  Continue Mucinex and aggressive treatment of reflux.      See below.  7. Hypertension history, normotensive.  Holding home Diovan HCT.      Outpatient follow-up with primary M.D.  8. Hypothyroid.  Continue home Synthroid.  9. Anemia, with history of iron deficiency and B12 deficiency.      Discharge hemoglobin 10.  No acute blood loss  10.Seasonal rhinitis.  Continue aggressive treatment as below.   DISCHARGE MEDICATIONS:  1. Rocaltrol 0.25 mcg once daily.  2. Duragesic 50 mcg patch.  Change every 72 hours for pain control.      Next due on a.m. of December 25.  The patient instructed to remove      old patch before applying new patch.  3. Protonix 40 mg daily or Prilosec OTC once daily for indigestion and      reflux.  4. Nasonex spray one spray to each nostril every a.m.  5.  Mucinex over-the-counter 600 mg b.i.d.  6. Reglan 5 mg b.i.d. while on pain patch to help with nausea and      prevent constipation, to stop if diarrhea occurs.  7. Esther-C one tablet a.m.  8. Calcium supplement one tablet t.i.d.  9. Iron supplement daily.  10.Glucosamine supplement daily.  11.B12 shots monthly.  12.Allegra 80 mg once daily.  13.Levothroid 75 mcg once daily.  14.Aspirin 81 mg daily.  15.Actonel 35 mg once weekly.   The patient is instructed to hold her Diovan HCT until further eval  follow-up with Dr. Oliver Barre.  Hospital follow-up is scheduled with  primary care physician, Oliver Barre, for January 5 at 2:15 p.m.  Family  is to call if conflict with this appointment time for reschedule.  The  patient is stable for discharge home, where she will stay with her  daughter.  She will be arranged for home health, PT and OT, as well as a  youth rolling walker to assist in ambulation while awaiting time for the  healing fracture.  Anticipate decreasing need for Duragesic and defer to  primary M.D. to wean off as fracture pain resolves.   CONDITION ON DISCHARGE:  Frail, but medically stable.   HOSPITAL COURSE BY PROBLEM:  1. Intractable back pain.  The patient is an 77 year old woman,      admitted through the [facility] due to intractable back pain      after history of a recent fall onto her backside in a shower.      Please see full H&P for these details.  Initially there was concern      for a compression fracture on plain film and an MRI of the thoracic      and lumbar spine were thus ordered.  However, there was no acute      fracture on MRI, but evidence of a nondisplaced sacral fracture as      the most likely cause of her pain.  She was treated with IV      morphine and Dilaudid, as well as being placed on a Duragesic      patch, initially 25 mcg and then 250 mcg for evaluation and      management of pain control.  She was seen in consultation by       Interventional Radiology for consideration of a sacroplasty, but      the patient felt that her symptoms were improving with pain      management.  She declined any procedure at this time.  She was seen      by PT and OT who did feel the patient was able to tolerate these      activities with a rolling walker of ambulation and daily living,      albeit slow.  This has been reviewed with her daughter, who      understands the patient's limitations and time needed for fracture      healing, but feels the patient will perform best with recovery at      her home.  Other evaluation of cause of fracture, although likely      traumatic related to her history of osteoporosis, revealed that she      did have a mild vitamin D deficiency with a level of 29, normal      being 38-89.  As such, she was begun on Rocaltrol at time of      discharge.  There was no mention of protein in her urine, protein      electrophoresis.  She will continue on her previously home dose      calcium, vitamin C and Actonel, with follow-up with primary M.D. in      the new year as noted above.  2. Chronic bronchitis symptoms.  The patient's chest x-ray did show      evidence of COPD changes and, in fact, for weeks prior to admission      she had been fighting with what she referred to as mucus coming      up her esophagus, causing her to be nauseated and not want to eat.      The MRI done for her back, as described above, also showed evidence      of dilation on her esophagus and, due to concerns for achalasia or  stricture, she was seen by GI in consultation.  An EGD was      performed on December 19, which showed esophagitis but no other      significant abnormalities.  She was treated during this      hospitalization with Diflucan for possible Candida infection, as      well as initiated on proton pump inhibitor.  I have also added      Reglan to her medical regimen and maximized treatment of upper      respiratory  sinuses related to her chronic seasonal rhinitis with      Nasonex in addition to continuing her home Allegra.  Mucinex over-      the-counter was also added to her regimen.  The patient does feel      symptomatically improved, though she still reports some nausea      which may also be exacerbated by her narcotics as used for      treatment of above issue related to pain, but she is having bowel      movements.  She is having no vomiting and tolerating her meals.      Therefore, she is felt stable for discharge home to consider follow-      up and evaluation of this on an as-needed basis.  3. Other medical issues.  The patient's other medical issues are as      listed above.  Regarding her chronic disease, she has reached      evidence of maximal hospital benefit and now felt stable for      discharge home with home health as above.      Valerie A. Felicity Coyer, MD  Electronically Signed     VAL/MEDQ  D:  05/09/2007  T:  05/09/2007  Job:  [id number]

## 2010-09-28 NOTE — H&P (Signed)
NAME:  Edelson, Jenalyn                 ACCOUNT NO.:  [id number]   MEDICAL RECORD NO.:  [id number]          PATIENT TYPE:  INP   LOCATION:  4707                         FACILITY:  [facility]   PHYSICIAN:  Corwin Levins, MD      DATE OF BIRTH:  01-03-1916   DATE OF ADMISSION:  02/10/2007  DATE OF DISCHARGE:                              HISTORY & PHYSICAL   CHIEF COMPLAINT:  Chest pain and lower extremity pain with inability to  walk.   HISTORY OF PRESENT ILLNESS:  Ms. Santo is an 77 year old white female  who I have seen multiple times in the office, and in fact twice in the  past 2 weeks, who presents with chest discomfort in the setting of pain  all over and worsening lower extremity pain and erythema.  She is in at  least moderate to severe pain, though she is very stoic and simply tries  not to complain of anything.  She lives with a daughter who is very  supportive.  She was seen recently with lower extremity erythema at the  ankles bilaterally, for which she was begun on doxycycline.  She  unfortunately did not tolerate this at all and had significant nausea  and vomiting, though she was quite stoic again and took it for 7 days.  The daughter finally called and she was changed to Levaquin and seemed  to do some better the last several days, but then worse again over the  last 2 days.  After I saw her 7 days in the office, we did arrange for  lower extremity arterial Dopplers yesterday which preliminary reports  show she did not have significant blockage.  She also had a 2D  echocardiogram February 09, 2007 with preliminary results of ejection  fraction 35%, global hypokinesis, and at least moderate AS.  As she is  in severe pain, very stoic, and essentially unable to ambulate, and  complaining of chest discomfort in the setting of moderate AS and left  bundle branch block, she is now admitted for further evaluation.   PAST MEDICAL HISTORY/ILLNESSES:  1. Hypertension.  2. Anemia.  3. Allergies.  4. Hypothyroidism.  5. History of aortic stenosis with near syncope December 2006, now      moderate by echocardiogram of September 2008 with ejection fraction      of 25 to 35%.  6. Chronic left bundle branch block.  7. Osteoporosis.  8. B12 deficiency.   ALLERGIES:  DOXYCYCLINE CAUSES NAUSEA AND VOMITING.   CURRENT MEDICATIONS:  1. Allegra 180 mg p.o. every day.  2. Diovan HC unknown dose.  3. Levothyroxine unknown dose.  4. Aspirin 81 mg one p.o. every day.  5. Actonel 150 mg p.o. every month.  6. Darvocet q. 6 hours p.r.n.   SOCIAL HISTORY:  No tobacco.  No alcohol.  Lives with daughter.   FAMILY HISTORY:  Noncontributory.   REVIEW OF SYSTEMS:  Noncontributory.   PHYSICAL EXAMINATION:  VITAL SIGNS:  She is afebrile.  Blood pressure  135/52, heart rate 56, respirations 14, O2 saturation 100%.  ENT EXAM:  Sclerae  are clear.  TMs are clear.  Oropharynx benign.  NECK:  Without lymphadenopathy, JVD, thyromegaly.  CHEST:  No rales or wheezing.  CARDIAC EXAM:  Somewhat distant but regular rate and rhythm with a grade  2/6 systolic murmur.  ABDOMEN:  Soft, nontender.  Positive bowel sounds.  EXTREMITIES:  Bilateral lower extremity, right greater than left,  erythema.  Tender and bright red erythema mostly at the ankles and the  distal 1/3 of the leg, right greater than left.  Curiously this does not  involve the soles of the feet or the toes of either foot.   LABORATORY DATA:  ECG:  Sinus rhythm with chronic left bundle branch  block.  Chest x-ray:  No acute disease.  UA negative.  CPK MB negative  x1.  Hemoglobin 11.3, white blood cell count 6.9.  Electrolytes within  normal limits except for a sodium of 128, BUN 11, creatinine 0.8,  glucose 139.   ASSESSMENT/PLAN:  1. Chest pain:  Questionable etiology.  I suspect musculoskeletal, but      with a moderate aortic stenosis and left bundle branch block cannot      rule out angina or acute coronary syndrome.   She will be admitted      and placed on telemetry.  Rule out myocardial infarction with      serial enzymes.  2. Lower extremity erythema with pain and severe gait problems:      Essentially worse over the last 2 days, now essentially prostrate.      She has been on doxycycline and Levaquin without significant      improvement.  I suspect vasculitis.  Will check sedimentation rate.      Will need to consider a skin biopsy, but unlikely to be able to get      tomorrow, Sunday, February 11, 2007.  She will be started on IV      steroid trials and also covered with IV Keflex.  There is no      history of Methicillin-resistant Staphylococcus aureus.  She is      also given Darvocet for pain and she has done well with this in the      past.  3. Hyponatremia:  Mild.  She has been on HCTZ, as well as recent      nausea and vomiting.  Will hold the Diovan HCT, give IV fluids, and      followup labs.  4. Hypertension:  Otherwise as above.  5. Hypothyroidism:  Will need to verify dosing.  Otherwise, continue      home medications.  6. Prophylaxis:  Give proton pump inhibitor therapy and Lovenox      subcutaneous.  7. Disposition:  For home when better.      Corwin Levins, MD  Electronically Signed     JWJ/MEDQ  D:  02/10/2007  T:  02/11/2007  Job:  [id number]

## 2010-09-28 NOTE — H&P (Signed)
NAME:  Theresa Hill, Theresa Hill                 ACCOUNT NO.:  [id number]   MEDICAL RECORD NO.:  [id number]          PATIENT TYPE:  INP   LOCATION:  1334                         FACILITY:  [facility]   PHYSICIAN:  Hollice Espy, M.D.DATE OF BIRTH:  1915-08-21   DATE OF ADMISSION:  04/30/2007  DATE OF DISCHARGE:                              HISTORY & PHYSICAL   PRIMARY CARE PHYSICIAN:  Corwin Levins, M.D.   CHIEF COMPLAINT:  Back pain and weakness.   HISTORY OF PRESENT ILLNESS:  The patient is an 77 year old white female  with past medical history of osteoporosis and hypertension who 10 days  ago lost her balance after coming out of the shower and landed on her  posterior end.  Initially she was complaining of some mild soreness, but  over the next several days progressed to have episodes of severe lower  back pain, although less on her tail bone, more on her lower back.  The  patient lives with her daughter and son-in-law and they admit that she  had been looking more rundown and feeling very weak so they brought her  in to see her primary care physician, Dr. Jonny Ruiz, in the office last week.  When he evaluated her, he was concerned about possibility maybe of a  mild bronchitis and he put her on a course of Zithromax.  Since then  according to the daughter, the patient has been doing all right but in  the last 24 hours has been looking to be a bit more rundown and weaker  and complaining of severe lower back pain.  She became concerned and  brought the patient into the emergency room for further evaluation.   In the emergency room, the patient had labs done. She was found to have  a normal white count of 9.6.  However, with an 86% shift.  Urinalysis  was essentially unremarkable.  Chest x-ray showed some cardiomegaly but  no evidence of any acute infiltrate process.  Thoracic and lumbar spine  films were done, thoracic showing some T9 and T11 compression fractures  which looked to be older.  Her  lumbar film also showed some older lumbar  compression fractures, age indeterminate with an L5 compression fracture  consistent with where she had fallen 10 days ago.  The patient was given  medications for pain and is currently fast asleep, so I am unable to get  any review of systems and her history was obtained from her daughter.   PAST MEDICAL HISTORY:  1. Osteoporosis.  2. Hypertension.  3. Iron-deficiency anemia.  4. Seasonal rhinitis.   MEDICATIONS:  1. Allegra 180.  2. Diovan 320.  3. HCTZ 25.  4. Aspirin 81.  5. Iron 325.  6. Os-Cal 600.  7. Actonel 35 every week.  8. Glucosamine 1500 p.o. daily.  9. B12 injections monthly.   ALLERGIES:  No known drug allergies.   SOCIAL HISTORY:  No tobacco, alcohol or drug use.  She currently lives  with her daughter.   FAMILY HISTORY:  Noncontributory.   PHYSICAL EXAMINATION:  VITAL SIGNS:  On admission, temperature  99.1,  heart rate 93, now down to 82.  Blood pressure 146/88.  Respirations 28.  Oxygen saturation 94% on room air.  GENERAL:  The patient is sleeping heavily.  I am unable to get her  oriented status.  HEENT:  Normocephalic, atraumatic.  Mucous membranes are dry.  She has  no carotid bruits.  HEART:  Regular rate and rhythm.  S1 and S2.  Grade 2/6 systolic  ejection murmur.  LUNGS:  She has some bilateral expiratory wheezing throughout.  ABDOMEN:  Soft, nontender, nondistended.  Hypoactive bowel sounds.  EXTREMITIES:  No clubbing or cyanosis.  She had trace pitting edema.   LABORATORY DATA:  Sodium 132, potassium 4.1, chloride 101, bicarb 23,  BUN 7, creatinine 0.7, glucose 128.  White count 9.6, hemoglobin 10.9,  hematocrit 32.  MCV 101.  Platelet count 226, 86% shift.  PT/INR normal.  UA notes large hemoglobin, small leukocyte esterase, 3-6 white cells, 7-  10 red cells.  I have ordered a TSH level which is pending.  Chest x-ray  and lumbar and thoracic films are as per HPI.   ASSESSMENT/PLAN:  1. Lower  back pain and weakness, especially noting her compression      fractures old and possibly new on her films.  Will plan to check an      MRI at the thoracic and lumbar spines to evaluate for acuity of      compression fractures.  If there is acute fractures, will discuss      with intervention radiology but the possibility of kyphoplasty      following this procedure and/or if there is no procedure able to be      done, will get PT/OT evaluation..  The patient may end up needing      to go to skilled nursing facility or if she is unable to      participate in rehab, nursing home placement.  In the meantime, we      will work on pain control.  2. Osteoporosis.  Continue calcium.  3. Hypertension.  Continue Diovan.  4. Hypothyroidism.  Continue Synthroid.  Check a TSH to make sure that      we are not masking any hypothyroidism as an ongoing cause.  5. B12 deficiency.  She gets monthly injections.      Hollice Espy, M.D.  Electronically Signed     SKK/MEDQ  D:  05/01/2007  T:  05/01/2007  Job:  [id number]   cc:   Corwin Levins, MD  [address]  [place]  [place] [address]

## 2010-09-28 NOTE — Discharge Summary (Signed)
NAME:  Theresa Hill, Theresa Hill                 ACCOUNT NO.:  [id number]   MEDICAL RECORD NO.:  [id number]          PATIENT TYPE:  INP   LOCATION:  4707                         FACILITY:  [facility]   PHYSICIAN:  Valerie A. Felicity Hill, MDDATE OF BIRTH:  1915/05/18   DATE OF ADMISSION:  02/10/2007  DATE OF DISCHARGE:  02/13/2007                               DISCHARGE SUMMARY   DISCHARGE DIAGNOSES:  1. Atypical chest pain, resolved.  2. Lower extremity erythema with pain and gait difficulty.  3. Hyponatremia in setting of hydrochlorothiazide.  4. Hypothyroid.  5. History of aortic stenosis.  6. History of chronic left bundle branch block.  7. Osteoporosis.  8. History of B12 deficiency.   HISTORY OF PRESENT ILLNESS:  Theresa Hill is an 77 year old female who  was admitted on February 10, 2007, with a chief complaint of chest pain  and lower extremity pain with inability to walk.  The patient did  undergo lower extremity arterial Dopplers as an outpatient for  complaints of lower extremity erythema.  She was admitted for further  evaluation and treatment.   PAST MEDICAL HISTORY:  1. Hypertension.  2. Anemia.  3. Allergies.  4. Hypothyroidism.  5. History of aortic stenosis and near syncope December 2006 with      ejection fraction of 25-35% per 2-D echo performed September 2008.  6. Chronic left bundle branch block.  7. Osteoporosis.  8. B12 deficiency.   COURSE OF HOSPITALIZATION:  Problem 1.  ATYPICAL CHEST PAIN:  The patient was admitted and underwent  serial cardiac enzymes, which were negative.  Her chest pain is  currently resolved.  She has remained stable on telemetry.   Problem 2.  LOWER EXTREMITY ERYTHEMA/PAIN WITH GAIT DIFFICULTY:  The  patient was initially started on IV Solu-Medrol for question of  vasculitis; however, her ESR was within normal limits.  There was also  some question of some mild cellulitis of the lower extremities.  She  will be continued on empiric Keflex.  At  the time of discharge she is  currently ambulating and it is recommended that she use a cane per  physical therapy.  We will also arrange home health PT at time of  discharge.   Problem 3.  HYPONATREMIA:  The patient had been on Diovan/HCT prior to  admission and was noted to be hyponatremic on admission.  Her blood  pressure medication had been held.  blood pressure at time of discharge  is 145/76.  She was on Diovan 320/25 mg prior to this admission.  We  will resume Diovan minus the hydrochlorothiazide component at the time  of discharge.   MEDICATIONS AT TIME OF DISCHARGE:  1. Keflex 250 mg p.o. t.i.d. x7 days.  2. Allegra 180 mg p.o. daily.  3. Levothyroxine 75 mcg p.o. daily.  4. Aspirin 81 mg p.o. daily.  5. Actonel 150 mg p.o. once monthly.  6. Darvocet-N 100 one tablet p.o. q.6h. as needed.  7. Diovan 320 mg p.o. daily.   LABORATORIES AT TIME OF DISCHARGE:  BUN 12, creatinine 0.8.  Hemoglobin  10.2, hematocrit 30.6.  Cardiac enzymes negative.  ESR 50.   DISPOSITION AND PLAN:  Discharge the patient to home with home health  physical therapy.   FOLLOW UP:  The patient is to follow up with Dr. Oliver Hill on October 1  at 11:30 a.m.  She is instructed to call Dr. Jonny Hill should she develop  worsening pain in her ankle or difficulty walking and to return to the  ER if recurrent chest pain.      Theresa Craze, NP      Theresa Rover. Felicity Coyer, MD  Electronically Signed    MO/MEDQ  D:  02/13/2007  T:  02/13/2007  Job:  [id number]   cc:   Theresa Levins, MD

## 2010-09-28 NOTE — Discharge Summary (Signed)
NAME:  Theresa Hill, Theresa Hill                 ACCOUNT NO.:  [id number]   MEDICAL RECORD NO.:  [id number]          PATIENT TYPE:  INP   LOCATION:  4707                         FACILITY:  [facility]   PHYSICIAN:  Valerie A. Felicity Hill, MDDATE OF BIRTH:  [birth year]   DATE OF ADMISSION:  02/10/2007  DATE OF DISCHARGE:                               DISCHARGE SUMMARY   ADDENDUM:  The patient's discharge was held due to nausea and vomiting  after a dose of Keflex.  The patient was changed to Augmentin without  any further nausea.  Upon further discussion with the daughter, she is  very concerned about multiple side effects of antibiotics the patient  has had recently.  She states she has had antibiotics for the majority  of the last 3 weeks, but there is no clear sign of active infection in  the lower extremities.  We will give the patient a trial off of  antibiotics.  Plan to discharge the patient to home today with home  health physical therapy.      Sandford Craze, NP      Theresa Rover. Felicity Coyer, MD  Electronically Signed    MO/MEDQ  D:  02/14/2007  T:  02/14/2007  Job:  [id number]   cc:   Corwin Levins, MD

## 2010-09-28 NOTE — Discharge Summary (Signed)
NAME:  Hill, Theresa                 ACCOUNT NO.:  [id number]   MEDICAL RECORD NO.:  [id number]          PATIENT TYPE:  INP   LOCATION:  4711                         FACILITY:  [facility]   PHYSICIAN:  Valerie A. Felicity Coyer, MDDATE OF BIRTH:  [birth year]   DATE OF ADMISSION:  05/13/2007  DATE OF DISCHARGE:  05/23/2007                               DISCHARGE SUMMARY   DISCHARGE DIAGNOSES:  1. Acute on chronic systolic heart failure.  2. Chronic back pain secondary to acute sacral fracture by MRI      December 2008.  3. Iron deficiency anemia.  4. Esophageal dilatation.  5. Seasonal rhinitis.   HISTORY OF PRESENT ILLNESS:  Ms. Theresa Hill is an 77 year old white female  with recent hospitalization on April 30, 2007 status post fall with  acute sacral fracture.  The patient was discharged home with daughter on  December 24 and returned to the emergency room on December 28 with  sudden onset of shortness of breath.  Lab work at that time showed  increased white blood cell count and bilateral infiltrates with CHF on a  chest x-ray.  The patient's family reported a history of weakness,  falls, feeling ill for several days prior to ER.  The patient was  admitted to the hospital for further evaluation.   PAST MEDICAL HISTORY:  1. Osteoporosis.  2. Hypertension.  3. Iron deficiency anemia.  4. Seasonal rhinitis.  5. Acute sacral fracture April 30, 2007 by MRI.  6. Esophageal dilatation.  7. Chronic obstructive pulmonary disease.  8. Hypothyroid.   COURSE IN HOSPITALIZATION:  1. Acute on chronic systolic heart failure in setting of known aortic      stenosis and decreased ejection fraction with echo done February 09, 2007 showing ejection fraction to be 25-35%.  Patient was      carefully diuresed.  A 2D echo was repeated which revealed the same      findings as the previous echo with LVEF at 25-35%.  The patient's      hospitalization was complicated by episodes of hypotension and     trying to adjust meds for diuresis and hypertension.  The patient      now felt clinically stable for discharge and appears euvolemic on      exam with a weight of 55.8 kg.  2. Chronic back pain secondary to acute sacral fracture by MRI in      December 2008.  Pain control during this admission with Duragesic      patch.  3. Iron deficiency anemia.  The patient's hemoglobin remained stable      throughout the hospitalization with a discharge hemoglobin of 10.4.  4. Esophageal dilatation was seen on MRI at previous hospitalization      as well as chest CT done during this hospitalization.  Further      evaluation was felt necessary secondary to reflux as well as      patient's pulmonary issues and increased risk of aspiration.      Barium swallow study was performed by radiology and was negative  for any achalasia.  The patient will be continued on a proton pump      inhibitor.   MEDICATIONS AT TIME OF DISCHARGE:  1. Calcitriol 0.24 mcg p.o. daily.  2. Fentanyl patch 50 mcg per hour every 72 hours.  3. Nasonex 2 sprays each nostril daily at bedtime.  4. Protonix 40 mg p.o. daily.  5. Guaifenesin 600 mg p.o. b.i.d.  6. Reglan 5 mg p.o. t.i.d.  7. Claritin 10 mg p.o. daily.  8. Levothyroxine 75 mcg p.o. daily.  9. Aspirin 81 mg p.o. daily.  10.Potassium chloride 20 mEq daily.  11.Lasix 60 mg p.o. daily.  12.Avapro 75 mg p.o. daily.  13.Triamcinolone 0.1% cream applied p.r.n.   PHYSICAL EXAMINATION AT TIME OF DISCHARGE:  The patient is an elderly  white female in no acute distress.  VITAL SIGNS:  Blood pressure 90/57, heart rate 72, respirations 18, temp  97.7, O2 SAT 93% on room air.  HEAD, EYES, EARS, NOSE, THROAT:  Normocephalic, atraumatic.  Mucous  membranes are moist.  No carotid bruits or JVD.  HEART:  Regular rate and rhythm.  S1, S2 with a grade 2/6 systolic  murmur.  Lung sounds are clear to auscultation bilaterally with  diminished lower lobes and no increased  work of breathing.  ABDOMEN:  Soft, nontender, nondistended.  Bowel sounds are positive.  EXTREMITIES:  Negative for swelling or edema bilaterally.  NEUROLOGICALLY:  The patient is alert and oriented x3 with no focal  neuro deficits on exam.   LABS AT TIME OF DISCHARGE:  Hemoglobin 10.4, hematocrit 31.0, BUN 14,  creatinine 0.88, random cortisol level 17.1.  BNP 625 down from 1080.0  on admission.  TSH 10.44; the patient's medication was adjusted  accordingly.  B12 level 1327.  Patient to be discharged to [facility] for [facility] and hopes to return home with daughter.  A  follow-up appointment will be scheduled with Dr. Jonny Ruiz in the upcoming  weeks.  Daughter has been made aware of discharge plans and discharge  diagnoses as well as any new medications added.  She verified  understanding of plan and denied any further questions upon discharge.      Valerie A. Felicity Coyer, MD  Electronically Signed     VAL/MEDQ  D:  05/23/2007  T:  05/23/2007  Job:  [id number]

## 2010-09-29 ENCOUNTER — Encounter: Payer: Self-pay | Admitting: Internal Medicine

## 2010-09-29 ENCOUNTER — Telehealth: Payer: Self-pay | Admitting: Internal Medicine

## 2010-09-29 ENCOUNTER — Ambulatory Visit (INDEPENDENT_AMBULATORY_CARE_PROVIDER_SITE_OTHER): Payer: Medicare Other | Admitting: Internal Medicine

## 2010-09-29 ENCOUNTER — Other Ambulatory Visit (INDEPENDENT_AMBULATORY_CARE_PROVIDER_SITE_OTHER): Payer: Medicare Other

## 2010-09-29 VITALS — BP 120/70 | HR 76 | Temp 97.3°F | Ht 60.0 in | Wt 127.5 lb

## 2010-09-29 DIAGNOSIS — R10819 Abdominal tenderness, unspecified site: Secondary | ICD-10-CM

## 2010-09-29 DIAGNOSIS — I10 Essential (primary) hypertension: Secondary | ICD-10-CM

## 2010-09-29 DIAGNOSIS — Z0000 Encounter for general adult medical examination without abnormal findings: Secondary | ICD-10-CM

## 2010-09-29 DIAGNOSIS — K219 Gastro-esophageal reflux disease without esophagitis: Secondary | ICD-10-CM

## 2010-09-29 DIAGNOSIS — E538 Deficiency of other specified B group vitamins: Secondary | ICD-10-CM

## 2010-09-29 DIAGNOSIS — S065X9A Traumatic subdural hemorrhage with loss of consciousness of unspecified duration, initial encounter: Secondary | ICD-10-CM

## 2010-09-29 DIAGNOSIS — I609 Nontraumatic subarachnoid hemorrhage, unspecified: Secondary | ICD-10-CM

## 2010-09-29 DIAGNOSIS — M1990 Unspecified osteoarthritis, unspecified site: Secondary | ICD-10-CM

## 2010-09-29 DIAGNOSIS — I6200 Nontraumatic subdural hemorrhage, unspecified: Secondary | ICD-10-CM

## 2010-09-29 HISTORY — DX: Nontraumatic subarachnoid hemorrhage, unspecified: I60.9

## 2010-09-29 HISTORY — DX: Traumatic subdural hemorrhage with loss of consciousness of unspecified duration, initial encounter: S06.5X9A

## 2010-09-29 HISTORY — DX: Unspecified osteoarthritis, unspecified site: M19.90

## 2010-09-29 HISTORY — DX: Gastro-esophageal reflux disease without esophagitis: K21.9

## 2010-09-29 LAB — URINALYSIS, ROUTINE W REFLEX MICROSCOPIC
Bilirubin Urine: NEGATIVE
Hgb urine dipstick: NEGATIVE
Total Protein, Urine: NEGATIVE
Urine Glucose: NEGATIVE

## 2010-09-29 MED ORDER — OMEPRAZOLE 20 MG PO CPDR: 40.0000 mg | DELAYED_RELEASE_CAPSULE | Freq: Every day | ORAL | Status: DC

## 2010-09-29 MED ORDER — CYCLOBENZAPRINE HCL 5 MG PO TABS: 5.0000 mg | ORAL_TABLET | Freq: Three times a day (TID) | ORAL | Status: DC | PRN

## 2010-09-29 MED ORDER — CYANOCOBALAMIN 1000 MCG/ML IJ SOLN
1000.0000 ug | Freq: Once | INTRAMUSCULAR | Status: AC
  Administered 2010-09-29: 1000 ug via INTRAMUSCULAR

## 2010-09-29 MED ORDER — CIPROFLOXACIN HCL 250 MG PO TABS: 250.0000 mg | ORAL_TABLET | Freq: Two times a day (BID) | ORAL | Status: DC

## 2010-09-29 NOTE — Assessment & Plan Note (Signed)
Ok to stay off the diovan for now, re-asess in 4 wks  BP Readings from Last 3 Encounters:  09/29/10 120/70  07/30/10 110/68  10/07/09 122/62

## 2010-09-29 NOTE — Assessment & Plan Note (Signed)
At this point , due to age and frailty, will recommend to stay off the ASA indefinitely

## 2010-09-29 NOTE — Assessment & Plan Note (Signed)
For b12 shot IM today

## 2010-09-29 NOTE — Assessment & Plan Note (Signed)
I suspect MSK strain, will tx with flexeril prn, but also check UA

## 2010-09-29 NOTE — Progress Notes (Signed)
Subjective:    Patient ID: Theresa Hill, female    DOB: Dec 31, 1915, 77 y.o.   MRN: [id number]  HPI here to f/u; unfortunately was on a trip out of state, and pt prob tripped on a rug at a store along the way, fell to the right head, seen at [facility] in [facility]  Now off ASA, and several meds changed including off the diovan now.  Just left hospn Mon May 14, tolerated the trip home well.  Did have acute onset pain to the right flank area with using the walker and making a "quick turn" tx with tylenol successfully since then, with some return of the pain about 930 last pm when wore off, and tx after with ultracet.   Pt denies fever, wt loss, night sweats, loss of appetite, or other constitutional symptoms  Denies urinary symptoms such as dysuria, frequency, urgency,or hematuria.  Still walks with walker, no further falls, lives with daughter who states seems to walk fine. Pt denies chest pain, increased sob or doe, wheezing, orthopnea, PND, increased LE swelling, palpitations, dizziness or syncope. Pt denies new neurological symptoms such as new headache, or facial or extremity weakness or numbness   Pt denies polydipsia, polyuria.   Past Medical History  Diagnosis Date  . HYPOTHYROIDISM 10/11/2006  . VITAMIN B12 DEFICIENCY 10/11/2006  . HYPONATREMIA 07/23/2007  . ANEMIA-NOS 09/14/2007  . HYPERTENSION 10/11/2006  . AORTIC STENOSIS 02/23/2007  . LEFT BUNDLE BRANCH BLOCK 10/11/2006  . BRADYCARDIA 10/11/2006  . CONGESTIVE HEART FAILURE 07/23/2007  . ALLERGIC RHINITIS 10/11/2006  . COPD 07/23/2007  . LOW BACK PAIN 09/14/2007  . OSTEOPOROSIS 02/23/2007  . OSTEOPENIA 10/11/2006  . SYNCOPE 10/11/2006  . FATIGUE 07/23/2007  . RASH-NONVESICULAR 02/23/2007  . WEIGHT LOSS 09/14/2007  . CHEST PAIN 04/27/2007  . Wheezing 07/30/2010  . SAH (subarachnoid hemorrhage) 09/29/2010  . SDH (subdural hematoma) 09/29/2010  . GERD (gastroesophageal reflux disease) 09/29/2010  . DJD (degenerative joint disease) 09/29/2010   No past  surgical history on file.  reports that she has never smoked. She does not have any smokeless tobacco history on file. She reports that she does not drink alcohol or use illicit drugs. family history includes Cancer in her other; Diabetes in her other; and Thyroid disease in her maternal grandmother and mother. Allergies  Allergen Reactions  . Cephalexin     REACTION: vomit  . Doxycycline Hyclate     REACTION: vomit  . Sulfamethoxazole W/Trimethoprim    Current Outpatient Prescriptions on File Prior to Visit  Medication Sig Dispense Refill  . alendronate (FOSAMAX) 70 MG tablet Take 70 mg by mouth every 7 (seven) days. Take with a full glass of water on an empty stomach.       . calcitRIOL (ROCALTROL) 0.25 MCG capsule Take 0.25 mcg by mouth daily.        . cyanocobalamin (,VITAMIN B-12,) 1000 MCG/ML injection Inject 1,000 mcg into the muscle every 30 (thirty) days.        . ferrous sulfate 325 (65 FE) MG tablet Take 325 mg by mouth daily.        . fexofenadine (ALLEGRA) 180 MG tablet Take 180 mg by mouth daily.        Marland Kitchen levothyroxine (SYNTHROID, LEVOTHROID) 50 MCG tablet Take 50 mcg by mouth daily.        . potassium chloride SA (KLOR-CON M20) 20 MEQ tablet Take 20 mEq by mouth daily.        . traMADol-acetaminophen (ULTRACET) 37.5-325  MG per tablet 1-2 by mouth four times daily as needed       . DISCONTD: omeprazole (PRILOSEC) 20 MG capsule Take 20 mg by mouth daily.        Marland Kitchen DISCONTD: aspirin 81 MG EC tablet Take 81 mg by mouth daily.        Marland Kitchen DISCONTD: azithromycin (ZITHROMAX) 250 MG tablet Take 2 tablets by mouth on day 1, followed by 1 tablet by mouth daily for 4 days. 2 by mouth for 1 day, then 1 by mouth every day for 4 days, then stop.       Marland Kitchen DISCONTD: furosemide (LASIX) 20 MG tablet Take 20 mg by mouth 3 (three) times daily.        Marland Kitchen DISCONTD: levothyroxine (SYNTHROID, LEVOTHROID) 75 MCG tablet Take 75 mcg by mouth daily.        Marland Kitchen DISCONTD: predniSONE (DELTASONE) 10 MG tablet Take  10 mg by mouth daily. For 5 days       . DISCONTD: triamcinolone (KENALOG) 0.1 % cream Apply topically 2 (two) times daily.        Marland Kitchen DISCONTD: valsartan (DIOVAN) 160 MG tablet 1/2 by mouth every day        No current facility-administered medications on file prior to visit.   Review of Systems All otherwise neg per pt     Objective:   Physical Exam BP 120/70  Pulse 76  Temp(Src) 97.3 F (36.3 C) (Oral)  Ht 5' (1.524 m)  Wt 127 lb 8 oz (57.834 kg)  BMI 24.90 kg/m2  SpO2 98% Physical Exam  VS noted Constitutional: Pt appears well-developed and well-nourished.  HENT: Head: Normocephalic.  Right Ear: External ear normal.  Left Ear: External ear normal.  Eyes: Conjunctivae and EOM are normal. Pupils are equal, round, and reactive to light.  Neck: Normal range of motion. Neck supple.  Cardiovascular: Normal rate and regular rhythm.   Pulmonary/Chest: Effort normal and breath sounds normal.  Abd:  Soft, NT, non-distended, + BS Does have mild right flank tenderness, none on the left Neurological: Pt is alert. No cranial nerve deficit. motor grossly intact, walks with walker as before Skin: Skin is warm. No erythema.  small contusion noted at zygomatic arch area Psychiatric: Pt behavior is normal. Thought content normal.         Assessment & Plan:

## 2010-09-29 NOTE — Patient Instructions (Addendum)
You had your B12 shot today Take all new medications as prescribed - the flexeril for muscle relaxer Please go to LAB in the Basement for the urine tests to be done today Please call the phone number [phone] (the PhoneTree System) for results of testing in 2-3 days;  When calling, simply dial the number, and when prompted enter the MRN number above (the Medical Record Number) and the # key, then the message should start. Please return in 1 mo with Lab testing done 3-5 days before

## 2010-09-29 NOTE — Telephone Encounter (Signed)
UA c/w UTI  For cipro course - done per emr

## 2010-09-30 ENCOUNTER — Telehealth: Payer: Self-pay | Admitting: *Deleted

## 2010-09-30 MED ORDER — AMOXICILLIN 500 MG PO CAPS: 500.0000 mg | ORAL_CAPSULE | Freq: Two times a day (BID) | ORAL | Status: AC

## 2010-09-30 NOTE — Telephone Encounter (Signed)
Pt had not started Cipro yet... She has ammox now and will start this evening with pt's dinner. Advised her to keep bland diet and ensure pt is drinking enough liquids. If unable to keep liquids down or any change in symptoms call office and speak w/on - call nurses OR go to ER. Daughter understands and w/call with any further questions.

## 2010-09-30 NOTE — Telephone Encounter (Signed)
Called patient; left message to call back.

## 2010-09-30 NOTE — Telephone Encounter (Signed)
To stop the cipro  Start the amoxil for uti

## 2010-09-30 NOTE — Telephone Encounter (Signed)
Daughter called, Pt became "sick" with dinner last night. Also took tramadol and muscle relaxer at same time. She finished dinner and has been "fine" but again vomited after lunch. She had muscle relaxer and tramadol this am w/no problems. Daughter is worried that pt will have problems taking antibiotic. Please advise.

## 2010-09-30 NOTE — Telephone Encounter (Signed)
Called patient informed her daughter of results and medication sent to pharmacy.

## 2010-10-01 ENCOUNTER — Encounter: Payer: Self-pay | Admitting: Internal Medicine

## 2010-10-01 NOTE — Discharge Summary (Signed)
NAME:  Theresa Hill, Theresa Hill                 ACCOUNT NO.:  [id number]   MEDICAL RECORD NO.:  [id number]          PATIENT TYPE:  INP   LOCATION:  3729                         FACILITY:  [facility]   PHYSICIAN:  Bruce H. Swords, M.D. [facility] OF BIRTH:  07/28/1915   DATE OF ADMISSION:  05/05/2005  DATE OF DISCHARGE:  05/07/2005                                 DISCHARGE SUMMARY   DISCHARGE DIAGNOSES:  1.  Near syncope.  2.  Bradycardia.  3.  Aortic stenosis (new diagnosis).  4.  Left ventricular dysfunction.  5.  Left bundle branch block.  6.  Hypertension.  7.  Hypothyroidism.  8.  Osteoporosis.  9.  B-12 deficiency.  10. Microcytic anemia.   DISCHARGE MEDICATIONS:  1.  Lisinopril 10 mg p.o. daily.  2.  Levoxyl 75 mcg p.o. daily.  3.  B-12 shot once monthly.  4.  __________ 35 mg p.o. q week.  5.  Calcium 600 mg b.i.d.  6.  Allegra 180 mg p.o. daily p.r.n.   HOSPITAL PROCEDURES:  Echocardiogram demonstrated left ventricular  dysfunction with dyskinesis of the septal wall, ejection fraction of  approximately 35% to 45%.  Aortic valve area diminished with echo results  consistent with moderate to severe aortic valve stenosis.  Cannot rule out  bicuspid aortic valve by echo.   CONDITION ON DISCHARGE:  Improved.  Patient ambulating.   FOLLOW UP PLANS:  Dr. Jonny Ruiz in 1-2 weeks.   HOSPITAL COURSE:  The patient was admitted to the hospitalist service on  May 05, 2005 with near syncope (see Dr. Diamantina Monks note for details).  The patient was placed on telemetry and found to be bradycardic.  Diltiazem  was held.  Echocardiogram was performed with results as above.  At this  time, the patient will be discharged in satisfactory condition.  Calcium  channel blocker will be held.  I think her blood pressure is adequate  without replacing the Diltiazem.  She may need other medications, but in  light of the aortic stenosis, I would rather her blood pressure run a bit  high, and treat her blood  pressure as an outpatient.   She does have a left bundle branch block.  She has been somewhat bradycardic  in the hospital, but this has been sinus bradycardia.  That will be followed  up as an outpatient off the Diltiazem.  The patient is eager to go home.  All of her questions were answered.      Bruce Rexene Edison Swords, M.D. [facility]  Electronically Signed     BHS/MEDQ  D:  05/07/2005  T:  05/07/2005  Job:  [id number]   cc:   Corwin Levins, M.D. [facility]  [address]  [place]  [place] [address]

## 2010-10-04 ENCOUNTER — Encounter: Payer: Self-pay | Admitting: Internal Medicine

## 2010-10-07 ENCOUNTER — Telehealth: Payer: Self-pay

## 2010-10-07 ENCOUNTER — Encounter: Payer: Self-pay | Admitting: Internal Medicine

## 2010-10-07 NOTE — Telephone Encounter (Signed)
Pt's daughter called stating she has notice that pt has had an increase in saliva x 3 weeks, especially at night. Daughter is concerned that this may be related to Lasix? Please advise?

## 2010-10-07 NOTE — Telephone Encounter (Signed)
Left message on machine for pt's daughter to return my call  

## 2010-10-07 NOTE — Telephone Encounter (Signed)
No, very unlikely due to lasix since this is a diuretic , and one would normally think it would cause dry mouth instead  Ok to follow for now, as long as she thinks not excessive

## 2010-10-08 NOTE — Telephone Encounter (Signed)
Per Theresa Hill, Pharmacist recommended Benadryl which she tried last night and it did helped a lot. Theresa Hill is requesting JWJ advise on if it is okay for pt to continue Benadryl or if there is a RX alternative that can help, please advise.  FYI- ABX has helped but she is still having some mild soreness on her RT side.

## 2010-10-15 ENCOUNTER — Encounter (HOSPITAL_BASED_OUTPATIENT_CLINIC_OR_DEPARTMENT_OTHER): Payer: Medicare Other | Attending: General Surgery

## 2010-10-15 DIAGNOSIS — L97309 Non-pressure chronic ulcer of unspecified ankle with unspecified severity: Secondary | ICD-10-CM | POA: Insufficient documentation

## 2010-10-15 DIAGNOSIS — G589 Mononeuropathy, unspecified: Secondary | ICD-10-CM | POA: Insufficient documentation

## 2010-10-15 DIAGNOSIS — Z79899 Other long term (current) drug therapy: Secondary | ICD-10-CM | POA: Insufficient documentation

## 2010-10-22 ENCOUNTER — Encounter: Payer: Self-pay | Admitting: Internal Medicine

## 2010-10-22 ENCOUNTER — Ambulatory Visit (INDEPENDENT_AMBULATORY_CARE_PROVIDER_SITE_OTHER): Payer: Medicare Other | Admitting: Internal Medicine

## 2010-10-22 VITALS — BP 110/62 | HR 72 | Temp 97.6°F | Ht 60.0 in | Wt 122.2 lb

## 2010-10-22 DIAGNOSIS — Z0000 Encounter for general adult medical examination without abnormal findings: Secondary | ICD-10-CM

## 2010-10-22 DIAGNOSIS — G459 Transient cerebral ischemic attack, unspecified: Secondary | ICD-10-CM

## 2010-10-22 DIAGNOSIS — R10819 Abdominal tenderness, unspecified site: Secondary | ICD-10-CM

## 2010-10-22 DIAGNOSIS — I509 Heart failure, unspecified: Secondary | ICD-10-CM

## 2010-10-22 DIAGNOSIS — E538 Deficiency of other specified B group vitamins: Secondary | ICD-10-CM

## 2010-10-22 MED ORDER — OMEPRAZOLE 20 MG PO CPDR: 20.0000 mg | DELAYED_RELEASE_CAPSULE | Freq: Every day | ORAL | Status: DC

## 2010-10-22 MED ORDER — CYANOCOBALAMIN 1000 MCG/ML IJ SOLN
1000.0000 ug | Freq: Once | INTRAMUSCULAR | Status: AC
  Administered 2010-10-22: 1000 ug via INTRAMUSCULAR

## 2010-10-22 NOTE — Assessment & Plan Note (Signed)
Last echo 2008 with EF approx 35%;  Has done remarkably well and has not been following with cardiology, has mod AS as well - will ask for repeat echo for this purpose, and ask to f/u with card as well, in the event she needs to be followed more closely in the near future

## 2010-10-22 NOTE — Assessment & Plan Note (Addendum)
Though only few minutes speech, I suspect prob TIA as hx is highly suggestive and I think accurate , will recommend to start asa 81 mg again, is not coumadin or other antiplt candidate at this time; duaghter and I agree further aggressive eval such as MRI, carotids, echo will not likely be of help for this purpose

## 2010-10-22 NOTE — Progress Notes (Signed)
Subjective:    Patient ID: Theresa Hill, female    DOB: [birth year], 77 y.o.   MRN: [id number]  HPI Here to f/u;  Still some minor right flank pain, but overall improved after the amoxil;  For B12 today,  Overall stamina improved, no further falls. Denies worsening reflux, dysphagia, abd pain, n/v, bowel change or blood, and only taking one per day omeprazole. Did have an episode of slurred speech 2 days after her last visit here where she was alert, but speech was unintelligible for only a few minutes, then resolved and daughter did not take to ER.  We had recommended off the ASA last visit. Pt denies chest pain, increased sob or doe, wheezing, orthopnea, PND, increased LE swelling, palpitations, dizziness or syncope.  Pt denies new neurological symptoms such as new headache, or facial or extremity weakness or numbness   Pt denies polydipsia, polyuria.  Pt denies chest pain, increased sob or doe, wheezing, orthopnea, PND, increased LE swelling, palpitations, dizziness or syncope.  Past Medical History  Diagnosis Date  . HYPOTHYROIDISM 10/11/2006  . VITAMIN B12 DEFICIENCY 10/11/2006  . HYPONATREMIA 07/23/2007  . ANEMIA-NOS 09/14/2007  . HYPERTENSION 10/11/2006  . AORTIC STENOSIS 02/23/2007  . LEFT BUNDLE BRANCH BLOCK 10/11/2006  . BRADYCARDIA 10/11/2006  . CONGESTIVE HEART FAILURE 07/23/2007  . ALLERGIC RHINITIS 10/11/2006  . COPD 07/23/2007  . LOW BACK PAIN 09/14/2007  . OSTEOPOROSIS 02/23/2007  . OSTEOPENIA 10/11/2006  . SYNCOPE 10/11/2006  . FATIGUE 07/23/2007  . RASH-NONVESICULAR 02/23/2007  . WEIGHT LOSS 09/14/2007  . CHEST PAIN 04/27/2007  . Wheezing 07/30/2010  . SAH (subarachnoid hemorrhage) 09/29/2010  . SDH (subdural hematoma) 09/29/2010  . GERD (gastroesophageal reflux disease) 09/29/2010  . DJD (degenerative joint disease) 09/29/2010   No past surgical history on file.  reports that she has never smoked. She does not have any smokeless tobacco history on file. She reports that she does not drink  alcohol or use illicit drugs. family history includes Cancer in her other; Diabetes in her other; and Thyroid disease in her maternal grandmother and mother. Allergies  Allergen Reactions  . Cephalexin     REACTION: vomit  . Doxycycline Hyclate     REACTION: vomit  . Sulfamethoxazole W/Trimethoprim    Current Outpatient Prescriptions on File Prior to Visit  Medication Sig Dispense Refill  . alendronate (FOSAMAX) 70 MG tablet Take 70 mg by mouth every 7 (seven) days. Take with a full glass of water on an empty stomach.       . calcitRIOL (ROCALTROL) 0.25 MCG capsule Take 0.25 mcg by mouth daily.        . cyanocobalamin (,VITAMIN B-12,) 1000 MCG/ML injection Inject 1,000 mcg into the muscle every 30 (thirty) days.        . fexofenadine (ALLEGRA) 180 MG tablet Take 180 mg by mouth daily.        Marland Kitchen levothyroxine (SYNTHROID, LEVOTHROID) 50 MCG tablet Take 50 mcg by mouth daily.        . potassium chloride SA (KLOR-CON M20) 20 MEQ tablet Take 20 mEq by mouth daily.        . traMADol-acetaminophen (ULTRACET) 37.5-325 MG per tablet 1-2 by mouth four times daily as needed        All otherwise neg per pt ,Review of Systems     Objective:   Physical Exam BP 110/62  Pulse 72  Temp(Src) 97.6 F (36.4 C) (Oral)  Ht 5' (1.524 m)  Wt 122 lb 4 oz (55.452  kg)  BMI 23.88 kg/m2  SpO2 96% Physical Exam  VS noted Constitutional: Pt appears well-developed and well-nourished.  HENT: Head: Normocephalic.  Right Ear: External ear normal.  Left Ear: External ear normal.  Eyes: Conjunctivae and EOM are normal. Pupils are equal, round, and reactive to light.  Neck: Normal range of motion. Neck supple.  Cardiovascular: Normal rate and regular rhythm.   Pulmonary/Chest: Effort normal and breath sounds normal.  Abd:  Soft, NT, non-distended, + BS Neurological: Pt is alert. No cranial nerve deficit. Motor/gait no change, walks with cane  Skin: Skin is warm. No erythema.  Psychiatric: Pt behavior is  normal. Thought content normal.  No flank tender       Assessment & Plan:

## 2010-10-22 NOTE — Patient Instructions (Addendum)
You had the b12 shot today Please re-start the Aspirin 81 mg - 1 per day - COATED only OK to take one Multivitamin per day OK to take one omeprazole per day only No need to take extra iron at this time Continue all other medications as before Please keep your appointments with your specialists as you have planned - the wound center You will be contacted regarding the referral for: cardiology, and the echocardiogram Please return in 6 mo with Lab testing done 3-5 days before, or sooner if needed

## 2010-10-24 ENCOUNTER — Encounter: Payer: Self-pay | Admitting: Internal Medicine

## 2010-10-24 NOTE — Assessment & Plan Note (Signed)
For routine maint shot today

## 2010-10-24 NOTE — Assessment & Plan Note (Signed)
Improved ? Msk,vs UTI/pyelo - now s/p amoxil course and improved,  to f/u any worsening symptoms or concerns

## 2010-10-28 ENCOUNTER — Ambulatory Visit: Payer: Medicare Other | Admitting: Internal Medicine

## 2010-11-02 ENCOUNTER — Other Ambulatory Visit (HOSPITAL_COMMUNITY): Payer: Medicare Other | Admitting: Radiology

## 2010-11-03 ENCOUNTER — Ambulatory Visit (HOSPITAL_COMMUNITY): Payer: Medicare Other | Attending: Internal Medicine | Admitting: Radiology

## 2010-11-03 DIAGNOSIS — I079 Rheumatic tricuspid valve disease, unspecified: Secondary | ICD-10-CM | POA: Insufficient documentation

## 2010-11-03 DIAGNOSIS — I359 Nonrheumatic aortic valve disorder, unspecified: Secondary | ICD-10-CM

## 2010-11-03 DIAGNOSIS — I379 Nonrheumatic pulmonary valve disorder, unspecified: Secondary | ICD-10-CM | POA: Insufficient documentation

## 2010-11-03 DIAGNOSIS — I080 Rheumatic disorders of both mitral and aortic valves: Secondary | ICD-10-CM | POA: Insufficient documentation

## 2010-11-03 DIAGNOSIS — I509 Heart failure, unspecified: Secondary | ICD-10-CM

## 2010-11-04 ENCOUNTER — Other Ambulatory Visit: Payer: Self-pay | Admitting: Internal Medicine

## 2010-11-05 ENCOUNTER — Telehealth: Payer: Self-pay

## 2010-11-05 ENCOUNTER — Encounter: Payer: Self-pay | Admitting: Internal Medicine

## 2010-11-05 ENCOUNTER — Ambulatory Visit (INDEPENDENT_AMBULATORY_CARE_PROVIDER_SITE_OTHER)
Admission: RE | Admit: 2010-11-05 | Discharge: 2010-11-05 | Disposition: A | Discharge status: Home / Self Care | Payer: Medicare Other | Source: Ambulatory Visit | Attending: Internal Medicine | Admitting: Internal Medicine

## 2010-11-05 ENCOUNTER — Ambulatory Visit (INDEPENDENT_AMBULATORY_CARE_PROVIDER_SITE_OTHER): Payer: Medicare Other | Admitting: Internal Medicine

## 2010-11-05 DIAGNOSIS — I359 Nonrheumatic aortic valve disorder, unspecified: Secondary | ICD-10-CM

## 2010-11-05 DIAGNOSIS — R062 Wheezing: Secondary | ICD-10-CM

## 2010-11-05 NOTE — Assessment & Plan Note (Signed)
Occurs with exertion - no wheeze on exam at rest - Suspect cardiac cause >pulm - check cxr now - see AS discussion

## 2010-11-05 NOTE — Patient Instructions (Signed)
It was good to see you today. Information about aortic stenosis given to you today Chest xray ordered today. Your results will be called to you after review. If any changes need to be made, you will be notified at that time. Until then, continue Kelfex and resume low dose lasix - 20mg  daily

## 2010-11-05 NOTE — Assessment & Plan Note (Signed)
Severe AS on echo 11/03/10 - reviewed with dtr/pt today Has been off diuretics since mid 09/2010 due to hyponatremia/dehydration - However, likely contrib to current dyspnea on exertion and "wheeze" symptoms - Will esume at low dose - prev 20mg  tid, resume now at 20qd - follow up with cards planned 11/18/10 to review same -  pt info from UTD provided to family today as expect little more to do than careful volume balance given adv age Also CXR today - consider change in abx if asymmetric infiltrate (on keflex for foot wounds - cont same pending cxr)

## 2010-11-05 NOTE — Telephone Encounter (Signed)
Message copied by Pincus Sanes on Fri Nov 05, 2010  5:55 PM ------      Message from: Anselm Jungling      Created: Fri Nov 05, 2010  4:50 PM                   ----- Message -----         From: Rene Paci, MD         Sent: 11/05/2010   4:43 PM           To: Margaret Pyle, CMA            Please call pt's home - CXR without infection - cont keflex as per wound center, no change antibiotics - also continue low dose lasix as per OV instructions - thanks

## 2010-11-05 NOTE — Progress Notes (Signed)
  Subjective:    Patient ID: Theresa Hill, female    DOB: Jul 17, 1915, 77 y.o.   MRN: [id number]  HPI complains of cough - "foamy clear sputum if anything comes out", usually dry LGF 99 at wound center today -On cephalex for wound care for feet x last 5 days Off lasix and diovan since mid May Onset cough 2 weeks ago - progressive symptoms each day symptoms exac with any exertion or lying down - family reports pt "sleeps" upright for last 4-5 weeks No edema, no palp and no CP Recent echo for TIA symptoms - showed severe AS - OV with cards planned 11/18/10  Past Medical History  Diagnosis Date  . HYPOTHYROIDISM 10/11/2006  . VITAMIN B12 DEFICIENCY 10/11/2006  . HYPONATREMIA 07/23/2007  . ANEMIA-NOS 09/14/2007  . HYPERTENSION 10/11/2006  . AORTIC STENOSIS 02/23/2007  . LEFT BUNDLE BRANCH BLOCK 10/11/2006  . BRADYCARDIA 10/11/2006  . CONGESTIVE HEART FAILURE 07/23/2007  . ALLERGIC RHINITIS 10/11/2006  . COPD 07/23/2007  . LOW BACK PAIN 09/14/2007  . OSTEOPOROSIS 02/23/2007  . OSTEOPENIA 10/11/2006  . SYNCOPE 10/11/2006  . RASH-NONVESICULAR 02/23/2007  . WEIGHT LOSS 09/14/2007  . SAH (subarachnoid hemorrhage) 09/29/2010  . SDH (subdural hematoma) 09/29/2010  . GERD (gastroesophageal reflux disease) 09/29/2010  . DJD (degenerative joint disease) 09/29/2010    Review of Systems  Constitutional: Positive for fatigue.  Respiratory: Positive for cough, shortness of breath and wheezing.   Cardiovascular: Negative for chest pain and leg swelling.  Neurological: Negative for syncope and light-headedness.       Objective:   Physical Exam BP 142/82  Pulse 82  Temp(Src) 98.5 F (36.9 C) (Oral)  Ht 5' (1.524 m)  SpO2 96% Physical Exam  Constitutional: She is elderly, frail - HOH but oriented to person, place, and time. No distress. dtr at side Neck: Normal range of motion. Neck supple. No JVD present. No thyromegaly present.  Cardiovascular: Normal rate, regular rhythm and normal heart sounds. Harsh 4/6  holo syst murmur heard. No BLE edema. Pulmonary/Chest: Effort normal and breath sounds diminished. No respiratory distress at rest. She has no wheezes.  Psychiatric: She has a normal mood and affect. Her behavior is normal. Judgment and thought content normal.       Lab Results  Component Value Date   WBC 8.4 07/30/2010   HGB 10.6* 07/30/2010   HCT 30.9* 07/30/2010   PLT 260.0 07/30/2010   CHOL 158 10/07/2009   TRIG 35.0 10/07/2009   HDL 84.00 10/07/2009   ALT 13 07/30/2010   AST 22 07/30/2010   NA 131* 07/30/2010   K 5.3* 07/30/2010   CL 92* 07/30/2010   CREATININE 1.1 07/30/2010   BUN 30* 07/30/2010   CO2 32 07/30/2010   TSH 5.69* 09/03/2010   INR 1.18 03/18/2009     Assessment & Plan:  See problem list. Medications and labs reviewed today. Time spent with pt/family today 30 minutes, greater than 50% time spent counseling patient on AS dx and course and medication review. Also review of prior records

## 2010-11-05 NOTE — Telephone Encounter (Signed)
Called and spoke to the patients daughter Fulton Mole and informed of results and MD's instructions.

## 2010-11-16 ENCOUNTER — Encounter: Payer: Self-pay | Admitting: Cardiology

## 2010-11-18 ENCOUNTER — Encounter: Payer: Self-pay | Admitting: Cardiology

## 2010-11-18 ENCOUNTER — Ambulatory Visit (INDEPENDENT_AMBULATORY_CARE_PROVIDER_SITE_OTHER): Payer: Medicare Other | Admitting: Cardiology

## 2010-11-18 DIAGNOSIS — I509 Heart failure, unspecified: Secondary | ICD-10-CM

## 2010-11-18 DIAGNOSIS — I359 Nonrheumatic aortic valve disorder, unspecified: Secondary | ICD-10-CM

## 2010-11-18 DIAGNOSIS — I10 Essential (primary) hypertension: Secondary | ICD-10-CM

## 2010-11-18 NOTE — Progress Notes (Signed)
HPI: 77 year old female for evaluation of congestive heart failure. Echocardiogram in June 2012 showed an ejection fraction of 40%, severe aortic stenosis with a mean gradient of 43 mmHg, mild AI, moderate mitral regurgitation, moderate left atrial enlargement, mild right atrial and right ventricular enlargement. In May the patient fell and was evaluated in [place]. She does not recall the details of the fall. She does have dyspnea with exertion and it has very limited mobility. There is no orthopnea or PND but there is mild pedal edema. There is no chest pain. Because of her aortic stenosis and history of congestive heart failure we were asked to evaluate.  Current Outpatient Prescriptions  Medication Sig Dispense Refill  . alendronate (FOSAMAX) 70 MG tablet TAKE 1 TABLET BY MOUTH EVERY WEEK  4 tablet  3  . Aspirin (ADULT ASPIRIN LOW STRENGTH) 81 MG EC tablet Take 81 mg by mouth daily.        Marland Kitchen Bioflavonoid Products (ESTER C PO) Take 1,000 mg by mouth daily.        . calcitRIOL (ROCALTROL) 0.25 MCG capsule take 1 capsule by mouth once daily  30 capsule  11  . calcium carbonate (OS-CAL) 600 MG TABS Take 600 mg by mouth daily.        . cyanocobalamin (,VITAMIN B-12,) 1000 MCG/ML injection Inject 1,000 mcg into the muscle every 30 (thirty) days.        . fexofenadine (ALLEGRA) 180 MG tablet Take 180 mg by mouth daily.        . furosemide (LASIX) 20 MG tablet Take 1 tablet (20 mg total) by mouth daily.      Marland Kitchen KLOR-CON M20 20 MEQ tablet TAKE 1 TABLET BY MOUTH ONCE DAILY  90 tablet  3  . levothyroxine (SYNTHROID, LEVOTHROID) 50 MCG tablet Take 50 mcg by mouth daily.        . Multiple Vitamins-Minerals (CENTRUM PO) Take by mouth daily.        Marland Kitchen omeprazole (PRILOSEC) 20 MG capsule TAKE 1 CAPSULE BY MOUTH ONCE DAILY  90 capsule  3  . traMADol-acetaminophen (ULTRACET) 37.5-325 MG per tablet 1-2 by mouth four times daily as needed       . triamcinolone (KENALOG) 0.1 % cream Apply topically 2 (two) times  daily.        Marland Kitchen DISCONTD: azithromycin (ZITHROMAX) 250 MG tablet Take 250 mg by mouth. 2 tabs daily for 1 day, then 1 tab daily for 4 days, then stop       . DISCONTD: cephALEXin (KEFLEX) 500 MG capsule Take 500 mg by mouth 2 (two) times daily.        Marland Kitchen DISCONTD: dextrose 5 % SOLN 125 mL with azithromycin 500 MG SOLR 250 mg Inject 250 mg into the vein daily.        Marland Kitchen DISCONTD: ferrous sulfate 325 (65 FE) MG tablet Take 325 mg by mouth daily with breakfast.        . DISCONTD: predniSONE (DELTASONE) 10 MG tablet Take 10 mg by mouth daily. Once daily for 5 days       . DISCONTD: valsartan (DIOVAN) 160 MG tablet Take 80 mg by mouth daily.          Allergies  Allergen Reactions  . Cephalexin     REACTION: vomit  . Doxycycline Hyclate     REACTION: vomit  . Sulfamethoxazole W/Trimethoprim     Past Medical History  Diagnosis Date  . HYPOTHYROIDISM 10/11/2006  . VITAMIN B12 DEFICIENCY 10/11/2006  .  HYPONATREMIA 07/23/2007  . ANEMIA-NOS 09/14/2007  . HYPERTENSION 10/11/2006  . AORTIC STENOSIS 02/23/2007  . LEFT BUNDLE BRANCH BLOCK 10/11/2006  . CONGESTIVE HEART FAILURE 07/23/2007  . COPD 07/23/2007  . OSTEOPOROSIS 02/23/2007  . OSTEOPENIA 10/11/2006  . SAH (subarachnoid hemorrhage) 09/29/2010  . SDH (subdural hematoma) 09/29/2010  . GERD (gastroesophageal reflux disease) 09/29/2010  . DJD (degenerative joint disease) 09/29/2010    Past Surgical History  Procedure Date  . Tonsillectomy   . Abdominal hysterectomy   . Cataract surgery   . Appendectomy     History   Social History  . Marital Status: Widowed    Spouse Name: N/A    Number of Children: 1  . Years of Education: N/A   Occupational History  .     Social History Main Topics  . Smoking status: Never Smoker   . Smokeless tobacco: Not on file  . Alcohol Use: No  . Drug Use: No  . Sexually Active: Not on file   Other Topics Concern  . Not on file   Social History Narrative   Lives with daughter.    Family History    Problem Relation Age of Onset  . Thyroid disease Mother   . Thyroid disease Maternal Grandmother   . Cancer Other     breast  . Diabetes Other     ROS: Arthritis but heno fevers or chills, productive cough, hemoptysis, dysphasia, odynophagia, melena, hematochezia, dysuria, hematuria, rash, seizure activity, orthopnea, PND, pedal edema, claudication. Remaining systems are negative.  Physical Exam: General:  Well developed/frail in NAD Skin warm/dry Patient not depressed No peripheral clubbing Back-normal HEENT-normal/normal eyelids Neck supple/normal carotid upstroke bilaterally; no JVD; no thyromegaly chest - CTA/ normal expansion CV - RRR/normal S1; no rubs or gallops;  PMI nondisplaced; 3/6 systolic murmur left sternal border radiating to the carotids. S2 is diminished. Abdomen -NT/ND, no HSM, no mass, + bowel sounds, no bruit 2+ femoral pulses, no bruits Ext-trace to 1+ edema, chords, 2+ DP on the left and 1+ on the right Neuro-grossly nonfocal  ECG 09/25/10 - Sinus rhythm with left bundle branch block.

## 2010-11-18 NOTE — Assessment & Plan Note (Signed)
Continue present medications. 

## 2010-11-18 NOTE — Assessment & Plan Note (Signed)
Patient has severe aortic stenosis on her echocardiogram. However she is approaching 77 years of age and is extremely frail. She is not a surgical candidate and I also do not think she is a candidate for percutaneous valve consideration. She does have some dyspnea on exertion but no chest pain. Possible recent syncopal episode. Regardless I have explained that medical therapy is the only option and she and her daughter are in agreement. We will continue with her present dose of Lasix. This may need to be increased further pending her symptoms. I also initiated discussions about CODE STATUS. They will discuss this further at home by her daughter feels conservative measures is appropriate.

## 2010-11-18 NOTE — Assessment & Plan Note (Signed)
Continue present dose of Lasix. I will not add ACE inhibitor or beta blocker as I would prefer for her blood pressure to run higher given the severity of her aortic stenosis.

## 2010-11-18 NOTE — Patient Instructions (Signed)
Your physician wants you to follow-up in: 3 MONTHS WITH DR CRENSHAW You will receive a reminder letter in the mail two months in advance. If you don't receive a letter, please call our office to schedule the follow-up appointment.  

## 2010-11-19 ENCOUNTER — Encounter (HOSPITAL_BASED_OUTPATIENT_CLINIC_OR_DEPARTMENT_OTHER): Payer: Medicare Other | Attending: General Surgery

## 2010-11-19 DIAGNOSIS — L97309 Non-pressure chronic ulcer of unspecified ankle with unspecified severity: Secondary | ICD-10-CM | POA: Insufficient documentation

## 2010-11-24 ENCOUNTER — Ambulatory Visit (INDEPENDENT_AMBULATORY_CARE_PROVIDER_SITE_OTHER): Payer: Medicare Other | Admitting: *Deleted

## 2010-11-24 DIAGNOSIS — E538 Deficiency of other specified B group vitamins: Secondary | ICD-10-CM

## 2010-11-24 MED ORDER — CYANOCOBALAMIN 1000 MCG/ML IJ SOLN
1000.0000 ug | Freq: Once | INTRAMUSCULAR | Status: AC
  Administered 2010-11-24: 1000 ug via INTRAMUSCULAR

## 2010-12-17 ENCOUNTER — Encounter (HOSPITAL_BASED_OUTPATIENT_CLINIC_OR_DEPARTMENT_OTHER): Payer: Medicare Other | Attending: General Surgery

## 2010-12-17 DIAGNOSIS — L97309 Non-pressure chronic ulcer of unspecified ankle with unspecified severity: Secondary | ICD-10-CM | POA: Insufficient documentation

## 2010-12-31 ENCOUNTER — Ambulatory Visit (INDEPENDENT_AMBULATORY_CARE_PROVIDER_SITE_OTHER): Payer: Medicare Other | Admitting: *Deleted

## 2010-12-31 DIAGNOSIS — E538 Deficiency of other specified B group vitamins: Secondary | ICD-10-CM

## 2010-12-31 MED ORDER — CYANOCOBALAMIN 1000 MCG/ML IJ SOLN
1000.0000 ug | Freq: Once | INTRAMUSCULAR | Status: AC
  Administered 2010-12-31: 1000 ug via INTRAMUSCULAR

## 2011-01-28 ENCOUNTER — Telehealth: Payer: Self-pay

## 2011-01-28 ENCOUNTER — Ambulatory Visit (INDEPENDENT_AMBULATORY_CARE_PROVIDER_SITE_OTHER): Payer: Medicare Other

## 2011-01-28 DIAGNOSIS — Z23 Encounter for immunization: Secondary | ICD-10-CM

## 2011-01-28 DIAGNOSIS — E538 Deficiency of other specified B group vitamins: Secondary | ICD-10-CM

## 2011-01-28 MED ORDER — CYANOCOBALAMIN 1000 MCG/ML IJ SOLN
1000.0000 ug | Freq: Once | INTRAMUSCULAR | Status: AC
  Administered 2011-01-28: 1000 ug via INTRAMUSCULAR

## 2011-01-28 NOTE — Telephone Encounter (Signed)
Patient came in for her B12 and flu shot today. The patients daughter informed she is having a little more congestion and cough especially when moving around faster, should they increase her lasix?

## 2011-01-28 NOTE — Telephone Encounter (Signed)
Called left message to call back 

## 2011-01-28 NOTE — Telephone Encounter (Signed)
Called patient; left message to call back.

## 2011-01-28 NOTE — Telephone Encounter (Signed)
I would not without an exam; they might consider allergy med such as allegra if not already taking something as post nasal gtt can cause what seems to be similar symptom for her

## 2011-01-28 NOTE — Telephone Encounter (Signed)
Called informed the patients daughter and the patient already takes allegra. She stated she would just watch her over the weekend and call if felt OV needed.

## 2011-02-03 LAB — URINALYSIS, ROUTINE W REFLEX MICROSCOPIC
Bilirubin Urine: NEGATIVE
Glucose, UA: NEGATIVE
Hgb urine dipstick: NEGATIVE
Protein, ur: NEGATIVE
Specific Gravity, Urine: 1.022
Urobilinogen, UA: 1

## 2011-02-03 LAB — COMPREHENSIVE METABOLIC PANEL
ALT: 20
AST: 22
Albumin: 3.1 — ABNORMAL LOW
Alkaline Phosphatase: 87
Chloride: 99
GFR calc Af Amer: >60
Potassium: 3.8
Sodium: 125 — ABNORMAL LOW
Total Bilirubin: 1.3 — ABNORMAL HIGH
Total Protein: 6.3

## 2011-02-03 LAB — BASIC METABOLIC PANEL
BUN: 10
CO2: 32
CO2: 35 — ABNORMAL HIGH
Calcium: 8.4
Calcium: 8.6
Calcium: 8.8
Creatinine, Ser: 0.69
Creatinine, Ser: 0.72
Creatinine, Ser: 0.88
GFR calc Af Amer: >60
GFR calc non Af Amer: >60
Glucose, Bld: 90
Potassium: 4

## 2011-02-03 LAB — CBC
HCT: 34.1 — ABNORMAL LOW
Hemoglobin: 11.1 — ABNORMAL LOW
MCHC: 33.2
MCHC: 33.7
Platelets: 280
Platelets: 441 — ABNORMAL HIGH
RBC: 3.05 — ABNORMAL LOW
RDW: 17.1 — ABNORMAL HIGH
WBC: 12.2 — ABNORMAL HIGH

## 2011-02-03 LAB — DIFFERENTIAL
Basophils Absolute: 0
Basophils Relative: 0
Eosinophils Relative: 0
Monocytes Absolute: 1
Monocytes Relative: 8

## 2011-02-03 LAB — B-NATRIURETIC PEPTIDE (CONVERTED LAB)
Pro B Natriuretic peptide (BNP): 625 — ABNORMAL HIGH
Pro B Natriuretic peptide (BNP): 767 — ABNORMAL HIGH

## 2011-02-03 LAB — CORTISOL: Cortisol, Plasma: 17.1

## 2011-02-18 LAB — URINALYSIS, ROUTINE W REFLEX MICROSCOPIC
Glucose, UA: NEGATIVE
Protein, ur: NEGATIVE
pH: 6.5

## 2011-02-18 LAB — CBC
HCT: 36.4
Hemoglobin: 11.9 — ABNORMAL LOW
Hemoglobin: 10.9 — ABNORMAL LOW
MCHC: 33.6
MCV: 102.9 — ABNORMAL HIGH
MCV: 103.4 — ABNORMAL HIGH
Platelets: 470 — ABNORMAL HIGH
Platelets: 300
RBC: 3.22 — ABNORMAL LOW
RBC: 3.11 — ABNORMAL LOW
RDW: 17.4 — ABNORMAL HIGH
WBC: 12.7 — ABNORMAL HIGH
WBC: 7.8

## 2011-02-18 LAB — BASIC METABOLIC PANEL
BUN: 10
BUN: 2 — ABNORMAL LOW
BUN: 9
CO2: 33 — ABNORMAL HIGH
CO2: 34 — ABNORMAL HIGH
CO2: 23
Calcium: 7.6 — ABNORMAL LOW
Calcium: 8 — ABNORMAL LOW
Calcium: 8 — ABNORMAL LOW
Calcium: 8.5
Calcium: 9.1
Chloride: 92 — ABNORMAL LOW
Chloride: 95 — ABNORMAL LOW
Creatinine, Ser: 0.68
Creatinine, Ser: 0.75
Creatinine, Ser: 0.65
Creatinine, Ser: 0.85
GFR calc Af Amer: >60
GFR calc Af Amer: >60
GFR calc Af Amer: >60
GFR calc Af Amer: >60
GFR calc non Af Amer: >60
GFR calc non Af Amer: >60
GFR calc non Af Amer: >60
GFR calc non Af Amer: >60
GFR calc non Af Amer: >60
GFR calc non Af Amer: >60
Glucose, Bld: 87
Potassium: 2.8 — ABNORMAL LOW
Sodium: 132 — ABNORMAL LOW
Sodium: 132 — ABNORMAL LOW
Sodium: 133 — ABNORMAL LOW

## 2011-02-18 LAB — COMPREHENSIVE METABOLIC PANEL
ALT: 20
Alkaline Phosphatase: 85
BUN: 12
CO2: 36 — ABNORMAL HIGH
Chloride: 90 — ABNORMAL LOW
Glucose, Bld: 89
Potassium: 2.4 — CL
Sodium: 133 — ABNORMAL LOW
Total Bilirubin: 0.6
Total Protein: 5.4 — ABNORMAL LOW

## 2011-02-18 LAB — I-STAT 8, (EC8 V) (CONVERTED LAB)
BUN: 14
Bicarbonate: 32.9 — ABNORMAL HIGH
Chloride: 95 — ABNORMAL LOW
Glucose, Bld: 113 — ABNORMAL HIGH
Operator id: 261381
pCO2, Ven: 37.7 — ABNORMAL LOW
pH, Ven: 7.548 — ABNORMAL HIGH

## 2011-02-18 LAB — DIFFERENTIAL
Eosinophils Absolute: 0
Eosinophils Relative: 0
Lymphocytes Relative: 9 — ABNORMAL LOW
Lymphocytes Relative: 7 — ABNORMAL LOW
Lymphs Abs: 1.2
Lymphs Abs: 0.7
Monocytes Absolute: 1.1 — ABNORMAL HIGH
Monocytes Absolute: 0.6
Monocytes Relative: 6
Neutro Abs: 8.2 — ABNORMAL HIGH

## 2011-02-18 LAB — POCT CARDIAC MARKERS
CKMB, poc: 2.2
Myoglobin, poc: 103
Operator id: 261381
Troponin i, poc: <0.05

## 2011-02-18 LAB — POCT I-STAT CREATININE
Creatinine, Ser: 0.9
Operator id: 261381

## 2011-02-18 LAB — C-REACTIVE PROTEIN: CRP: 1.6 — ABNORMAL HIGH (ref <0.6)

## 2011-02-18 LAB — UIFE/LIGHT CHAINS/TP QN, 24-HR UR
Free Lambda Excretion/Day: 7.26
Free Lambda Lt Chains,Ur: 0.22 (ref 0.08–1.01)
Volume, Urine: 3300

## 2011-02-18 LAB — VITAMIN D 25 HYDROXY (VIT D DEFICIENCY, FRACTURES): Vit D, 25-Hydroxy: 29 — ABNORMAL LOW (ref 30–89)

## 2011-02-18 LAB — CARDIAC PANEL(CRET KIN+CKTOT+MB+TROPI)
CK, MB: 3.4
Relative Index: INVALID
Total CK: 72
Troponin I: 0.18 — ABNORMAL HIGH

## 2011-02-18 LAB — D-DIMER, QUANTITATIVE: D-Dimer, Quant: 1.38 — ABNORMAL HIGH

## 2011-02-18 LAB — CK TOTAL AND CKMB (NOT AT ARMC): Total CK: 69

## 2011-02-18 LAB — TSH: TSH: 6.961 — ABNORMAL HIGH

## 2011-02-18 LAB — URINE MICROSCOPIC-ADD ON

## 2011-02-18 LAB — CLOTEST (H. PYLORI), BIOPSY: Helicobacter screen: NEGATIVE — AB

## 2011-02-18 LAB — HEMOGLOBIN AND HEMATOCRIT, BLOOD: Hemoglobin: 10 — ABNORMAL LOW

## 2011-02-23 ENCOUNTER — Ambulatory Visit (INDEPENDENT_AMBULATORY_CARE_PROVIDER_SITE_OTHER): Payer: Medicare Other | Admitting: Cardiology

## 2011-02-23 ENCOUNTER — Encounter: Payer: Self-pay | Admitting: Cardiology

## 2011-02-23 VITALS — BP 120/70 | Wt 118.0 lb

## 2011-02-23 DIAGNOSIS — I509 Heart failure, unspecified: Secondary | ICD-10-CM

## 2011-02-23 DIAGNOSIS — I359 Nonrheumatic aortic valve disorder, unspecified: Secondary | ICD-10-CM

## 2011-02-23 DIAGNOSIS — I10 Essential (primary) hypertension: Secondary | ICD-10-CM

## 2011-02-23 LAB — BASIC METABOLIC PANEL
BUN: 19 mg/dL (ref 6–23)
Calcium: 9.2 mg/dL (ref 8.4–10.5)
Creatinine, Ser: 0.9 mg/dL (ref 0.4–1.2)
GFR: 59.07 mL/min — ABNORMAL LOW (ref >60.00)

## 2011-02-23 NOTE — Patient Instructions (Signed)
Your physician wants you to follow-up in: 6 months  You will receive a reminder letter in the mail two months in advance. If you don't receive a letter, please call our office to schedule the follow-up appointment.  Your physician recommends that you return for lab work in: today    

## 2011-02-23 NOTE — Progress Notes (Signed)
HPI:77 year old female I initially saw in July of 2012 for evaluation of congestive heart failure. Echocardiogram in June 2012 showed an ejection fraction of 40%, severe aortic stenosis with a mean gradient of 43 mmHg, mild AI, moderate mitral regurgitation, moderate left atrial enlargement, mild right atrial and right ventricular enlargement. In May the patient fell and was evaluated in [place]. When I saw her previously I explained that she was not a candidate for aortic valve replacement and she and her daughter were also in agreement with that. Since that time, she has mild dyspnea on exertion but no orthopnea or PND. Mild pedal edema on present dose of Lasix. No chest pain or syncope.   Current Outpatient Prescriptions  Medication Sig Dispense Refill  . alendronate (FOSAMAX) 70 MG tablet TAKE 1 TABLET BY MOUTH EVERY WEEK  4 tablet  3  . Aspirin (ADULT ASPIRIN LOW STRENGTH) 81 MG EC tablet Take 81 mg by mouth daily.        Marland Kitchen Bioflavonoid Products (ESTER C PO) Take 1,000 mg by mouth daily.        . calcitRIOL (ROCALTROL) 0.25 MCG capsule take 1 capsule by mouth once daily  30 capsule  11  . calcium carbonate (OS-CAL) 600 MG TABS Take 600 mg by mouth daily.        . cyanocobalamin (,VITAMIN B-12,) 1000 MCG/ML injection Inject 1,000 mcg into the muscle every 30 (thirty) days.        . fexofenadine (ALLEGRA) 180 MG tablet Take 180 mg by mouth daily.        . furosemide (LASIX) 20 MG tablet Take 1 tablet (20 mg total) by mouth daily.      Marland Kitchen KLOR-CON M20 20 MEQ tablet TAKE 1 TABLET BY MOUTH ONCE DAILY  90 tablet  3  . levothyroxine (SYNTHROID, LEVOTHROID) 50 MCG tablet Take 50 mcg by mouth daily.        . Multiple Vitamins-Minerals (CENTRUM PO) Take by mouth daily.        Marland Kitchen omeprazole (PRILOSEC) 20 MG capsule TAKE 1 CAPSULE BY MOUTH ONCE DAILY  90 capsule  3  . traMADol-acetaminophen (ULTRACET) 37.5-325 MG per tablet 1-2 by mouth four times daily as needed       . triamcinolone (KENALOG) 0.1 %  cream Apply topically 2 (two) times daily.           Past Medical History  Diagnosis Date  . HYPOTHYROIDISM 10/11/2006  . VITAMIN B12 DEFICIENCY 10/11/2006  . HYPONATREMIA 07/23/2007  . ANEMIA-NOS 09/14/2007  . HYPERTENSION 10/11/2006  . AORTIC STENOSIS 02/23/2007  . LEFT BUNDLE BRANCH BLOCK 10/11/2006  . CONGESTIVE HEART FAILURE 07/23/2007  . COPD 07/23/2007  . OSTEOPOROSIS 02/23/2007  . OSTEOPENIA 10/11/2006  . SAH (subarachnoid hemorrhage) 09/29/2010  . SDH (subdural hematoma) 09/29/2010  . GERD (gastroesophageal reflux disease) 09/29/2010  . DJD (degenerative joint disease) 09/29/2010    Past Surgical History  Procedure Date  . Tonsillectomy   . Abdominal hysterectomy   . Cataract surgery   . Appendectomy     History   Social History  . Marital Status: Widowed    Spouse Name: N/A    Number of Children: 1  . Years of Education: N/A   Occupational History  .     Social History Main Topics  . Smoking status: Never Smoker   . Smokeless tobacco: Not on file  . Alcohol Use: No  . Drug Use: No  . Sexually Active: Not on file   Other Topics  Concern  . Not on file   Social History Narrative   Lives with daughter.    ROS: no fevers or chills, productive cough, hemoptysis, dysphasia, odynophagia, melena, hematochezia, dysuria, hematuria, rash, seizure activity, orthopnea, PND, claudication. Remaining systems are negative.  Physical Exam: Well-developed frail in no acute distress.  Skin is warm and dry.  HEENT is normal.  Neck is supple. No thyromegaly.  Chest is clear to auscultation with normal expansion.  Cardiovascular exam is regular rate and rhythm. 3/6 systolic murmur left sternal border. S2 is not audible. Abdominal exam nontender or distended. No masses palpated. Extremities show trace edema. neuro grossly intact  ECG sinus rhythm at a rate of 71. Left ventricular hypertrophy with QRS widening and repolarization abnormality. Left axis deviation.

## 2011-02-23 NOTE — Assessment & Plan Note (Signed)
Patient with severe aortic stenosis. Not a candidate for aortic valve replacement. Continue conservative measures given age. Volume status reasonable on present dose of Lasix. Will continue. Check potassium and renal function.

## 2011-02-23 NOTE — Assessment & Plan Note (Signed)
Volume status reasonable. Continue present dose of Lasix.

## 2011-02-23 NOTE — Assessment & Plan Note (Signed)
Blood pressure controlled at present. 

## 2011-02-24 LAB — URINALYSIS, ROUTINE W REFLEX MICROSCOPIC
Bilirubin Urine: NEGATIVE
Hgb urine dipstick: NEGATIVE
Ketones, ur: NEGATIVE
Protein, ur: NEGATIVE
Urobilinogen, UA: 0.2

## 2011-02-24 LAB — I-STAT 8, (EC8 V) (CONVERTED LAB)
BUN: 11
Bicarbonate: 27.4 — ABNORMAL HIGH
Chloride: 96
HCT: 38
Hemoglobin: 12.9
Operator id: 285491
Potassium: 4.2
Sodium: 128 — ABNORMAL LOW

## 2011-02-24 LAB — POCT I-STAT CREATININE: Creatinine, Ser: 0.8

## 2011-02-24 LAB — CK TOTAL AND CKMB (NOT AT ARMC)
CK, MB: 1.3
CK, MB: 1.5
Total CK: 26

## 2011-02-24 LAB — BASIC METABOLIC PANEL
GFR calc non Af Amer: >60
Glucose, Bld: 80
Potassium: 4.2
Sodium: 131 — ABNORMAL LOW

## 2011-02-24 LAB — CBC
HCT: 30.6 — ABNORMAL LOW
HCT: 33.6 — ABNORMAL LOW
Hemoglobin: 10.2 — ABNORMAL LOW
MCV: 99.7
Platelets: 318
RDW: 16 — ABNORMAL HIGH
WBC: 6.3
WBC: 6.9

## 2011-02-24 LAB — POCT CARDIAC MARKERS
Myoglobin, poc: 54.9
Operator id: 279831

## 2011-02-24 LAB — TROPONIN I: Troponin I: 0.02

## 2011-02-24 LAB — SEDIMENTATION RATE: Sed Rate: 15

## 2011-02-24 LAB — DIFFERENTIAL
Eosinophils Absolute: 0.1
Eosinophils Relative: 1
Lymphs Abs: 1.1

## 2011-02-25 ENCOUNTER — Ambulatory Visit (INDEPENDENT_AMBULATORY_CARE_PROVIDER_SITE_OTHER): Payer: Medicare Other

## 2011-02-25 DIAGNOSIS — E538 Deficiency of other specified B group vitamins: Secondary | ICD-10-CM

## 2011-02-25 MED ORDER — CYANOCOBALAMIN 1000 MCG/ML IJ SOLN
1000.0000 ug | Freq: Once | INTRAMUSCULAR | Status: AC
  Administered 2011-02-25: 1000 ug via INTRAMUSCULAR

## 2011-03-04 ENCOUNTER — Ambulatory Visit: Payer: Medicare Other

## 2011-03-23 ENCOUNTER — Other Ambulatory Visit: Payer: Self-pay | Admitting: Internal Medicine

## 2011-04-01 ENCOUNTER — Ambulatory Visit (INDEPENDENT_AMBULATORY_CARE_PROVIDER_SITE_OTHER)
Admission: RE | Admit: 2011-04-01 | Discharge: 2011-04-01 | Disposition: A | Discharge status: Home / Self Care | Payer: Medicare Other | Source: Ambulatory Visit | Attending: Internal Medicine | Admitting: Internal Medicine

## 2011-04-01 ENCOUNTER — Encounter (HOSPITAL_BASED_OUTPATIENT_CLINIC_OR_DEPARTMENT_OTHER): Payer: Medicare Other | Attending: General Surgery

## 2011-04-01 ENCOUNTER — Encounter: Payer: Self-pay | Admitting: Internal Medicine

## 2011-04-01 ENCOUNTER — Ambulatory Visit (INDEPENDENT_AMBULATORY_CARE_PROVIDER_SITE_OTHER): Payer: Medicare Other

## 2011-04-01 ENCOUNTER — Ambulatory Visit (INDEPENDENT_AMBULATORY_CARE_PROVIDER_SITE_OTHER): Payer: Medicare Other | Admitting: Internal Medicine

## 2011-04-01 VITALS — BP 112/80 | HR 72 | Temp 98.1°F | Wt 110.0 lb

## 2011-04-01 DIAGNOSIS — I509 Heart failure, unspecified: Secondary | ICD-10-CM

## 2011-04-01 DIAGNOSIS — I872 Venous insufficiency (chronic) (peripheral): Secondary | ICD-10-CM | POA: Insufficient documentation

## 2011-04-01 DIAGNOSIS — I10 Essential (primary) hypertension: Secondary | ICD-10-CM

## 2011-04-01 DIAGNOSIS — J449 Chronic obstructive pulmonary disease, unspecified: Secondary | ICD-10-CM

## 2011-04-01 DIAGNOSIS — E538 Deficiency of other specified B group vitamins: Secondary | ICD-10-CM

## 2011-04-01 DIAGNOSIS — R062 Wheezing: Secondary | ICD-10-CM

## 2011-04-01 DIAGNOSIS — Z79899 Other long term (current) drug therapy: Secondary | ICD-10-CM | POA: Insufficient documentation

## 2011-04-01 DIAGNOSIS — Z8614 Personal history of Methicillin resistant Staphylococcus aureus infection: Secondary | ICD-10-CM | POA: Insufficient documentation

## 2011-04-01 DIAGNOSIS — L97309 Non-pressure chronic ulcer of unspecified ankle with unspecified severity: Secondary | ICD-10-CM | POA: Insufficient documentation

## 2011-04-01 MED ORDER — FUROSEMIDE 40 MG PO TABS: 40.0000 mg | ORAL_TABLET | Freq: Every day | ORAL | Status: DC

## 2011-04-01 MED ORDER — CYANOCOBALAMIN 1000 MCG/ML IJ SOLN
1000.0000 ug | Freq: Once | INTRAMUSCULAR | Status: AC
  Administered 2011-04-01: 1000 ug via INTRAMUSCULAR

## 2011-04-01 NOTE — Assessment & Plan Note (Addendum)
As above, also for cxr but I think less likely pna

## 2011-04-01 NOTE — Patient Instructions (Signed)
Increase the lasix to 40 mg in the am Please go to XRAY in the Basement for the x-ray test Please call the phone number [phone] (the [employer]Tree System) for results of testing in 2-3 days;  When calling, simply dial the number, and when prompted enter the MRN number above (the Medical Record Number) and the # key, then the message should start. Continue all other medications as before

## 2011-04-03 ENCOUNTER — Encounter: Payer: Self-pay | Admitting: Internal Medicine

## 2011-04-03 DIAGNOSIS — R062 Wheezing: Secondary | ICD-10-CM | POA: Insufficient documentation

## 2011-04-03 NOTE — Assessment & Plan Note (Signed)
stable overall by hx and exam, most recent data reviewed with pt, and pt to continue medical treatment as before  BP Readings from Last 3 Encounters:  04/01/11 112/80  02/23/11 120/70  11/18/10 116/68

## 2011-04-03 NOTE — Assessment & Plan Note (Signed)
O/s stable overall by hx and exam, most recent data reviewed with pt, and pt to continue medical treatment as before  SpO2 Readings from Last 3 Encounters:  04/01/11 94%  11/05/10 96%  10/22/10 96%

## 2011-04-03 NOTE — Assessment & Plan Note (Addendum)
No overt wheeze on exam, but with rales bibas representing ? Mild volume overload and what I supect is prob increased WOB on exertion felt to be wheezing by family and nurse;  Will tx with increase of lasix 20 mg to 40 mg per day (pt not actually taking 60 as noted on the EMR), check CXR, and f/u in 10 days

## 2011-04-03 NOTE — Progress Notes (Signed)
Subjective:    Patient ID: Theresa Hill, female    DOB: 1915-08-01, 77 y.o.   MRN: [id number]  HPI  Here with daughter for b12 shot, noted by staff nurse to have some wheezing/sob/doe, daughter states onset in the past wk, most noticeable with increased sob in the humidity of the shower, as well as increased cough to bending forward, had had lasix decreased to 20 qd per cardiology previously, and duaghter has been sick recetnly with URI symptoms (lives with pt) but  Pt denies fever, wt loss, night sweats, loss of appetite, or other constitutional symptoms and Pt denies chest pain,  orthopnea, PND, increased LE swelling, palpitations, dizziness or syncope.  Pt denies new neurological symptoms such as new headache, or facial or extremity weakness or numbness   Pt denies polydipsia, polyuria.  No recent falls Past Medical History  Diagnosis Date  . HYPOTHYROIDISM 10/11/2006  . VITAMIN B12 DEFICIENCY 10/11/2006  . HYPONATREMIA 07/23/2007  . ANEMIA-NOS 09/14/2007  . HYPERTENSION 10/11/2006  . AORTIC STENOSIS 02/23/2007  . LEFT BUNDLE BRANCH BLOCK 10/11/2006  . CONGESTIVE HEART FAILURE 07/23/2007  . COPD 07/23/2007  . OSTEOPOROSIS 02/23/2007  . OSTEOPENIA 10/11/2006  . SAH (subarachnoid hemorrhage) 09/29/2010  . SDH (subdural hematoma) 09/29/2010  . GERD (gastroesophageal reflux disease) 09/29/2010  . DJD (degenerative joint disease) 09/29/2010   Past Surgical History  Procedure Date  . Tonsillectomy   . Abdominal hysterectomy   . Cataract surgery   . Appendectomy     reports that she has never smoked. She does not have any smokeless tobacco history on file. She reports that she does not drink alcohol or use illicit drugs. family history includes Cancer in her other; Diabetes in her other; and Thyroid disease in her maternal grandmother and mother. Allergies  Allergen Reactions  . Cephalexin     REACTION: vomit  . Doxycycline Hyclate     REACTION: vomit  . Sulfamethoxazole W/Trimethoprim    Current  Outpatient Prescriptions on File Prior to Visit  Medication Sig Dispense Refill  . alendronate (FOSAMAX) 70 MG tablet TAKE 1 TABLET BY MOUTH EVERY WEEK  4 tablet  3  . Aspirin (ADULT ASPIRIN LOW STRENGTH) 81 MG EC tablet Take 81 mg by mouth daily.        Marland Kitchen Bioflavonoid Products (ESTER C PO) Take 1,000 mg by mouth daily.        . calcitRIOL (ROCALTROL) 0.25 MCG capsule take 1 capsule by mouth once daily  30 capsule  11  . calcium carbonate (OS-CAL) 600 MG TABS Take 600 mg by mouth daily.        . cyanocobalamin (,VITAMIN B-12,) 1000 MCG/ML injection Inject 1,000 mcg into the muscle every 30 (thirty) days.        . fexofenadine (ALLEGRA) 180 MG tablet Take 180 mg by mouth daily.        Marland Kitchen KLOR-CON M20 20 MEQ tablet TAKE 1 TABLET BY MOUTH ONCE DAILY  90 tablet  3  . levothyroxine (SYNTHROID, LEVOTHROID) 50 MCG tablet Take 50 mcg by mouth daily.        . Multiple Vitamins-Minerals (CENTRUM PO) Take by mouth daily.        Marland Kitchen omeprazole (PRILOSEC) 20 MG capsule TAKE 1 CAPSULE BY MOUTH ONCE DAILY  90 capsule  3  . traMADol-acetaminophen (ULTRACET) 37.5-325 MG per tablet 1-2 by mouth four times daily as needed       . triamcinolone (KENALOG) 0.1 % cream Apply topically 2 (two) times daily.  Review of Systems Review of Systems  Constitutional: Negative for diaphoresis and unexpected weight change.  HENT: Negative for drooling and tinnitus.   Eyes: Negative for photophobia and visual disturbance.  Respiratory: Negative for choking and stridor.   Gastrointestinal: Negative for vomiting and blood in stool.  Genitourinary: Negative for hematuria and decreased urine volume.      Objective:   Physical Exam BP 112/80  Pulse 72  Temp(Src) 98.1 F (36.7 C) (Oral)  Wt 110 lb (49.896 kg)  SpO2 94% Physical Exam  VS noted Constitutional: Pt appears well-developed and well-nourished.  HENT: Head: Normocephalic.  Right Ear: External ear normal.  Left Ear: External ear normal.  Eyes: Conjunctivae  and EOM are normal. Pupils are equal, round, and reactive to light.  Neck: Normal range of motion. Neck supple.  Cardiovascular: Normal rate and regular rhythm.   Pulmonary/Chest: Effort normal and breath sounds decreased with bibas crackles - ? dry , no overt wheeze noted Abd:  Soft, NT, non-distended, + BS Neurological: Pt is alert. No cranial nerve deficit.  Skin: Skin is warm. No erythema.  Psychiatric: Pt behavior is normal. Thought content normal.     Assessment & Plan:

## 2011-04-14 ENCOUNTER — Ambulatory Visit (INDEPENDENT_AMBULATORY_CARE_PROVIDER_SITE_OTHER): Payer: Medicare Other | Admitting: Internal Medicine

## 2011-04-14 ENCOUNTER — Other Ambulatory Visit (INDEPENDENT_AMBULATORY_CARE_PROVIDER_SITE_OTHER): Payer: Medicare Other

## 2011-04-14 ENCOUNTER — Encounter: Payer: Self-pay | Admitting: Internal Medicine

## 2011-04-14 VITALS — BP 102/62 | HR 74 | Temp 99.0°F | Wt 117.5 lb

## 2011-04-14 DIAGNOSIS — E039 Hypothyroidism, unspecified: Secondary | ICD-10-CM

## 2011-04-14 DIAGNOSIS — J449 Chronic obstructive pulmonary disease, unspecified: Secondary | ICD-10-CM

## 2011-04-14 DIAGNOSIS — I10 Essential (primary) hypertension: Secondary | ICD-10-CM

## 2011-04-14 DIAGNOSIS — Z79899 Other long term (current) drug therapy: Secondary | ICD-10-CM

## 2011-04-14 DIAGNOSIS — Z0000 Encounter for general adult medical examination without abnormal findings: Secondary | ICD-10-CM

## 2011-04-14 DIAGNOSIS — I509 Heart failure, unspecified: Secondary | ICD-10-CM

## 2011-04-14 DIAGNOSIS — J4489 Other specified chronic obstructive pulmonary disease: Secondary | ICD-10-CM

## 2011-04-14 LAB — URINALYSIS, ROUTINE W REFLEX MICROSCOPIC
Hgb urine dipstick: NEGATIVE
Ketones, ur: NEGATIVE
Leukocytes, UA: NEGATIVE
Specific Gravity, Urine: 1.01 (ref 1.000–1.030)
Urobilinogen, UA: 0.2 (ref 0.0–1.0)

## 2011-04-14 LAB — HEPATIC FUNCTION PANEL
AST: 22 U/L (ref 0–37)
Albumin: 4.2 g/dL (ref 3.5–5.2)
Alkaline Phosphatase: 68 U/L (ref 39–117)
Total Protein: 7.2 g/dL (ref 6.0–8.3)

## 2011-04-14 LAB — CBC WITH DIFFERENTIAL/PLATELET
Basophils Absolute: 0 10*3/uL (ref 0.0–0.1)
Eosinophils Absolute: 0.1 10*3/uL (ref 0.0–0.7)
HCT: 34 % — ABNORMAL LOW (ref 36.0–46.0)
Lymphocytes Relative: 19.2 % (ref 12.0–46.0)
Lymphs Abs: 1.2 10*3/uL (ref 0.7–4.0)
Monocytes Relative: 14 % — ABNORMAL HIGH (ref 3.0–12.0)
Platelets: 246 10*3/uL (ref 150.0–400.0)
RDW: 19 % — ABNORMAL HIGH (ref 11.5–14.6)

## 2011-04-14 LAB — BASIC METABOLIC PANEL
CO2: 30 mEq/L (ref 19–32)
Chloride: 100 mEq/L (ref 96–112)
Glucose, Bld: 68 mg/dL — ABNORMAL LOW (ref 70–99)
Potassium: 4.6 mEq/L (ref 3.5–5.1)
Sodium: 138 mEq/L (ref 135–145)

## 2011-04-14 LAB — TSH: TSH: 4.31 u[IU]/mL (ref 0.35–5.50)

## 2011-04-14 NOTE — Patient Instructions (Signed)
Continue all other medications as before Please go to LAB in the Basement for the blood and/or urine tests to be done today Please call the phone number [phone] (the [employer] System) for results of testing in 2-3 days;  When calling, simply dial the number, and when prompted enter the MRN number above (the Medical Record Number) and the # key, then the message should start. OK to hold on monthly B12 shots for now Please return in 3 months, or sooner if needed

## 2011-04-16 ENCOUNTER — Encounter: Payer: Self-pay | Admitting: Internal Medicine

## 2011-04-16 NOTE — Assessment & Plan Note (Signed)
stable overall by hx and exam, most recent data reviewed with pt, and pt to continue medical treatment as before  Lab Results  Component Value Date   TSH 4.31 04/14/2011

## 2011-04-16 NOTE — Assessment & Plan Note (Signed)
stable overall by hx and exam, most recent data reviewed with pt, and pt to continue medical treatment as before Lab Results  Component Value Date   WBC 6.1 04/14/2011   HGB 11.5* 04/14/2011   HCT 34.0* 04/14/2011   PLT 246.0 04/14/2011   GLUCOSE 68* 04/14/2011   CHOL 151 04/14/2011   TRIG 27.0 04/14/2011   HDL 94.10 04/14/2011   LDLCALC 52 04/14/2011   ALT 14 04/14/2011   AST 22 04/14/2011   NA 138 04/14/2011   K 4.6 04/14/2011   CL 100 04/14/2011   CREATININE 1.1 04/14/2011   BUN 26* 04/14/2011   CO2 30 04/14/2011   TSH 4.31 04/14/2011   INR 1.18 03/18/2009

## 2011-04-16 NOTE — Assessment & Plan Note (Signed)
stable overall by hx and exam, most recent data reviewed with pt, and pt to continue medical treatment as before BP Readings from Last 3 Encounters:  04/14/11 102/62  04/01/11 112/80  02/23/11 120/70

## 2011-04-16 NOTE — Assessment & Plan Note (Signed)
stable overall by hx and exam, most recent data reviewed with pt, and pt to continue medical treatment as before SpO2 Readings from Last 3 Encounters:  04/14/11 97%  04/01/11 94%  11/05/10 96%

## 2011-04-16 NOTE — Progress Notes (Signed)
Subjective:    Patient ID: Theresa Hill, female    DOB: Jun 25, 1915, 77 y.o.   MRN: [id number]  HPI  Here for f/u;  Overall doing ok;  Pt denies CP, worsening SOB, DOE, wheezing, orthopnea, PND, worsening LE edema, palpitations, dizziness or syncope.  Pt denies neurological change such as new Headache, facial or extremity weakness.  Pt denies polydipsia, polyuria,  Pt states overall good compliance with treatment and medications, good tolerability.  Pt denies worsening depressive symptoms, No fever, wt loss, night sweats, loss of appetite, or other constitutional symptoms.  Denies hyper or hypo thyroid symptoms such as voice, skin or hair change. Now on lasix 40 mg daily Past Medical History  Diagnosis Date  . HYPOTHYROIDISM 10/11/2006  . VITAMIN B12 DEFICIENCY 10/11/2006  . HYPONATREMIA 07/23/2007  . ANEMIA-NOS 09/14/2007  . HYPERTENSION 10/11/2006  . AORTIC STENOSIS 02/23/2007  . LEFT BUNDLE BRANCH BLOCK 10/11/2006  . CONGESTIVE HEART FAILURE 07/23/2007  . COPD 07/23/2007  . OSTEOPOROSIS 02/23/2007  . OSTEOPENIA 10/11/2006  . SAH (subarachnoid hemorrhage) 09/29/2010  . SDH (subdural hematoma) 09/29/2010  . GERD (gastroesophageal reflux disease) 09/29/2010  . DJD (degenerative joint disease) 09/29/2010   Past Surgical History  Procedure Date  . Tonsillectomy   . Abdominal hysterectomy   . Cataract surgery   . Appendectomy     reports that she has never smoked. She does not have any smokeless tobacco history on file. She reports that she does not drink alcohol or use illicit drugs. family history includes Cancer in her other; Diabetes in her other; and Thyroid disease in her maternal grandmother and mother. Allergies  Allergen Reactions  . Cephalexin     REACTION: vomit  . Doxycycline Hyclate     REACTION: vomit  . Sulfamethoxazole W/Trimethoprim    Current Outpatient Prescriptions on File Prior to Visit  Medication Sig Dispense Refill  . alendronate (FOSAMAX) 70 MG tablet TAKE 1 TABLET BY  MOUTH EVERY WEEK  4 tablet  3  . Aspirin (ADULT ASPIRIN LOW STRENGTH) 81 MG EC tablet Take 81 mg by mouth daily.        Marland Kitchen Bioflavonoid Products (ESTER C PO) Take 1,000 mg by mouth daily.        . calcitRIOL (ROCALTROL) 0.25 MCG capsule take 1 capsule by mouth once daily  30 capsule  11  . calcium carbonate (OS-CAL) 600 MG TABS Take 600 mg by mouth daily.        . cyanocobalamin (,VITAMIN B-12,) 1000 MCG/ML injection Inject 1,000 mcg into the muscle every 30 (thirty) days.        . fexofenadine (ALLEGRA) 180 MG tablet Take 180 mg by mouth daily.        . furosemide (LASIX) 40 MG tablet Take 1 tablet (40 mg total) by mouth daily.  30 tablet  11  . KLOR-CON M20 20 MEQ tablet TAKE 1 TABLET BY MOUTH ONCE DAILY  90 tablet  3  . levothyroxine (SYNTHROID, LEVOTHROID) 50 MCG tablet Take 50 mcg by mouth daily.        . Multiple Vitamins-Minerals (CENTRUM PO) Take by mouth daily.        Marland Kitchen omeprazole (PRILOSEC) 20 MG capsule TAKE 1 CAPSULE BY MOUTH ONCE DAILY  90 capsule  3  . traMADol-acetaminophen (ULTRACET) 37.5-325 MG per tablet 1-2 by mouth four times daily as needed       . triamcinolone (KENALOG) 0.1 % cream Apply topically 2 (two) times daily.  Review of Systems Review of Systems  Constitutional: Negative for diaphoresis and unexpected weight change.  HENT: Negative for drooling and tinnitus.   Eyes: Negative for photophobia and visual disturbance.  Respiratory: Negative for choking and stridor.   Gastrointestinal: Negative for vomiting and blood in stool.  Genitourinary: Negative for hematuria and decreased urine volume.     Objective:   Physical Exam BP 102/62  Pulse 74  Temp(Src) 99 F (37.2 C) (Oral)  Wt 117 lb 8 oz (53.298 kg)  SpO2 97% Physical Exam  VS noted Constitutional: Pt appears well-developed and well-nourished.  HENT: Head: Normocephalic.  Right Ear: External ear normal.  Left Ear: External ear normal.  Eyes: Conjunctivae and EOM are normal. Pupils are equal,  round, and reactive to light.  Neck: Normal range of motion. Neck supple.  Cardiovascular: Normal rate and regular rhythm. With gr 2/6 syst [id number]  Pulmonary/Chest: Effort normal and breath sounds normal.  Abd:  Soft, NT, non-distended, + BS Neurological: Pt is alert. No cranial nerve deficit.  Skin: Skin is warm. No erythema.  Psychiatric: Pt behavior is normal. Thought content normal.     Assessment & Plan:

## 2011-04-22 ENCOUNTER — Encounter (HOSPITAL_BASED_OUTPATIENT_CLINIC_OR_DEPARTMENT_OTHER): Payer: Medicare Other | Attending: General Surgery

## 2011-04-22 DIAGNOSIS — Z8614 Personal history of Methicillin resistant Staphylococcus aureus infection: Secondary | ICD-10-CM | POA: Insufficient documentation

## 2011-04-22 DIAGNOSIS — G589 Mononeuropathy, unspecified: Secondary | ICD-10-CM | POA: Insufficient documentation

## 2011-04-22 DIAGNOSIS — I872 Venous insufficiency (chronic) (peripheral): Secondary | ICD-10-CM | POA: Insufficient documentation

## 2011-04-22 DIAGNOSIS — L97309 Non-pressure chronic ulcer of unspecified ankle with unspecified severity: Secondary | ICD-10-CM | POA: Insufficient documentation

## 2011-04-22 DIAGNOSIS — Z79899 Other long term (current) drug therapy: Secondary | ICD-10-CM | POA: Insufficient documentation

## 2011-05-06 ENCOUNTER — Encounter (HOSPITAL_BASED_OUTPATIENT_CLINIC_OR_DEPARTMENT_OTHER): Payer: Medicare Other

## 2011-05-27 ENCOUNTER — Encounter (HOSPITAL_BASED_OUTPATIENT_CLINIC_OR_DEPARTMENT_OTHER): Payer: Medicare Other

## 2011-06-17 ENCOUNTER — Ambulatory Visit (HOSPITAL_COMMUNITY)
Admission: RE | Admit: 2011-06-17 | Discharge: 2011-06-17 | Disposition: A | Discharge status: Home / Self Care | Payer: Medicare Other | Source: Ambulatory Visit | Attending: General Surgery | Admitting: General Surgery

## 2011-06-17 ENCOUNTER — Encounter (HOSPITAL_BASED_OUTPATIENT_CLINIC_OR_DEPARTMENT_OTHER): Payer: Medicare Other | Attending: General Surgery

## 2011-06-17 ENCOUNTER — Other Ambulatory Visit: Payer: Self-pay | Admitting: Internal Medicine

## 2011-06-17 DIAGNOSIS — M7989 Other specified soft tissue disorders: Secondary | ICD-10-CM | POA: Insufficient documentation

## 2011-06-17 DIAGNOSIS — L97309 Non-pressure chronic ulcer of unspecified ankle with unspecified severity: Secondary | ICD-10-CM | POA: Insufficient documentation

## 2011-06-17 DIAGNOSIS — L97909 Non-pressure chronic ulcer of unspecified part of unspecified lower leg with unspecified severity: Secondary | ICD-10-CM

## 2011-06-17 DIAGNOSIS — R52 Pain, unspecified: Secondary | ICD-10-CM

## 2011-06-17 DIAGNOSIS — M79609 Pain in unspecified limb: Secondary | ICD-10-CM | POA: Insufficient documentation

## 2011-06-17 DIAGNOSIS — R609 Edema, unspecified: Secondary | ICD-10-CM

## 2011-06-17 DIAGNOSIS — I872 Venous insufficiency (chronic) (peripheral): Secondary | ICD-10-CM | POA: Insufficient documentation

## 2011-06-17 NOTE — Progress Notes (Signed)
PRELIMINARY  PRELIMINARY  Right leg venous duplex performed..  Negative for DVT.  Florestine Avers, RVT Chief Vascular Sonographer

## 2011-06-24 ENCOUNTER — Encounter (HOSPITAL_BASED_OUTPATIENT_CLINIC_OR_DEPARTMENT_OTHER): Payer: Medicare Other

## 2011-07-15 ENCOUNTER — Encounter (HOSPITAL_BASED_OUTPATIENT_CLINIC_OR_DEPARTMENT_OTHER): Payer: Medicare Other | Attending: General Surgery

## 2011-07-15 ENCOUNTER — Other Ambulatory Visit: Payer: Self-pay | Admitting: Internal Medicine

## 2011-07-15 DIAGNOSIS — I872 Venous insufficiency (chronic) (peripheral): Secondary | ICD-10-CM | POA: Insufficient documentation

## 2011-07-15 DIAGNOSIS — L97309 Non-pressure chronic ulcer of unspecified ankle with unspecified severity: Secondary | ICD-10-CM | POA: Insufficient documentation

## 2011-07-22 ENCOUNTER — Other Ambulatory Visit: Payer: Self-pay | Admitting: Internal Medicine

## 2011-08-19 ENCOUNTER — Encounter (HOSPITAL_BASED_OUTPATIENT_CLINIC_OR_DEPARTMENT_OTHER): Payer: Medicare Other

## 2011-09-09 ENCOUNTER — Encounter: Payer: Self-pay | Admitting: Cardiology

## 2011-09-09 ENCOUNTER — Ambulatory Visit (INDEPENDENT_AMBULATORY_CARE_PROVIDER_SITE_OTHER): Payer: Medicare Other | Admitting: Cardiology

## 2011-09-09 ENCOUNTER — Other Ambulatory Visit: Payer: Self-pay | Admitting: *Deleted

## 2011-09-09 VITALS — BP 122/70 | HR 65 | Wt 115.0 lb

## 2011-09-09 DIAGNOSIS — I509 Heart failure, unspecified: Secondary | ICD-10-CM

## 2011-09-09 DIAGNOSIS — I350 Nonrheumatic aortic (valve) stenosis: Secondary | ICD-10-CM

## 2011-09-09 DIAGNOSIS — I359 Nonrheumatic aortic valve disorder, unspecified: Secondary | ICD-10-CM

## 2011-09-09 DIAGNOSIS — E875 Hyperkalemia: Secondary | ICD-10-CM

## 2011-09-09 DIAGNOSIS — I10 Essential (primary) hypertension: Secondary | ICD-10-CM

## 2011-09-09 LAB — BASIC METABOLIC PANEL
BUN: 22 mg/dL (ref 6–23)
CO2: 32 mEq/L (ref 19–32)
Calcium: 10.6 mg/dL — ABNORMAL HIGH (ref 8.4–10.5)
Creatinine, Ser: 1 mg/dL (ref 0.4–1.2)
Glucose, Bld: 82 mg/dL (ref 70–99)

## 2011-09-09 NOTE — Assessment & Plan Note (Signed)
Continue Lasix at present dose. Check potassium and renal function. Additional 20 mg of Lasix daily when necessary for edema or dyspnea.

## 2011-09-09 NOTE — Progress Notes (Signed)
HPI: Pleasant female I initially saw in July of 2012 for evaluation of congestive heart failure. Echocardiogram in June 2012 showed an ejection fraction of 40%, severe aortic stenosis with a mean gradient of 43 mmHg, mild AI, moderate mitral regurgitation, moderate left atrial enlargement, mild right atrial and right ventricular enlargement. When I saw her previously I explained that she was not a candidate for aortic valve replacement and she and her daughter were also in agreement with that. I last saw her in Oct 2012. Since then, she has some dyspnea on exertion but no orthopnea or PND. She has chronic pedal edema with nonhealing ulcers. No chest pain or syncope.   Current Outpatient Prescriptions  Medication Sig Dispense Refill  . alendronate (FOSAMAX) 70 MG tablet TAKE 1 TABLET BY MOUTH EVERY WEEK  4 tablet  3  . Aspirin (ADULT ASPIRIN LOW STRENGTH) 81 MG EC tablet Take 81 mg by mouth daily.        Marland Kitchen Bioflavonoid Products (ESTER C PO) Take 1,000 mg by mouth daily.        . calcitRIOL (ROCALTROL) 0.25 MCG capsule take 1 capsule by mouth once daily  30 capsule  11  . calcium carbonate (OS-CAL) 600 MG TABS Take 600 mg by mouth daily.        . cyanocobalamin (,VITAMIN B-12,) 1000 MCG/ML injection Inject 1,000 mcg into the muscle every 30 (thirty) days.        . fexofenadine (ALLEGRA) 180 MG tablet Take 180 mg by mouth daily.        . furosemide (LASIX) 40 MG tablet Take 1 tablet (40 mg total) by mouth daily.  30 tablet  11  . KLOR-CON M20 20 MEQ tablet TAKE 1 TABLET BY MOUTH ONCE DAILY  90 tablet  3  . levothyroxine (SYNTHROID, LEVOTHROID) 75 MCG tablet take 1 tablet by mouth once daily  90 tablet  3  . Multiple Vitamins-Minerals (CENTRUM PO) Take by mouth daily.        Marland Kitchen omeprazole (PRILOSEC) 20 MG capsule TAKE 1 CAPSULE BY MOUTH ONCE DAILY  90 capsule  3  . traMADol-acetaminophen (ULTRACET) 37.5-325 MG per tablet take 1 to 2 tablets by mouth four times a day if needed  120 tablet  2  .  triamcinolone (KENALOG) 0.1 % cream Apply topically 2 (two) times daily.        Marland Kitchen DISCONTD: levothyroxine (SYNTHROID, LEVOTHROID) 50 MCG tablet Take 50 mcg by mouth daily.           Past Medical History  Diagnosis Date  . HYPOTHYROIDISM 10/11/2006  . VITAMIN B12 DEFICIENCY 10/11/2006  . HYPONATREMIA 07/23/2007  . ANEMIA-NOS 09/14/2007  . HYPERTENSION 10/11/2006  . AORTIC STENOSIS 02/23/2007  . LEFT BUNDLE BRANCH BLOCK 10/11/2006  . CONGESTIVE HEART FAILURE 07/23/2007  . COPD 07/23/2007  . OSTEOPOROSIS 02/23/2007  . OSTEOPENIA 10/11/2006  . SAH (subarachnoid hemorrhage) 09/29/2010  . SDH (subdural hematoma) 09/29/2010  . GERD (gastroesophageal reflux disease) 09/29/2010  . DJD (degenerative joint disease) 09/29/2010    Past Surgical History  Procedure Date  . Tonsillectomy   . Abdominal hysterectomy   . Cataract surgery   . Appendectomy     History   Social History  . Marital Status: Widowed    Spouse Name: N/A    Number of Children: 1  . Years of Education: N/A   Occupational History  .     Social History Main Topics  . Smoking status: Never Smoker   . Smokeless  tobacco: Not on file  . Alcohol Use: No  . Drug Use: No  . Sexually Active: Not on file   Other Topics Concern  . Not on file   Social History Narrative   Lives with daughter.    ROS: Arthralgias and nonhealing ulcers on her lower extremities but no fevers or chills, productive cough, hemoptysis, dysphasia, odynophagia, melena, hematochezia, dysuria, hematuria, rash, seizure activity, orthopnea, PND, claudication. Remaining systems are negative.  Physical Exam: Well-developed frail in no acute distress.  Skin is warm and dry.  HEENT is normal.  Neck is supple.  Chest is clear to auscultation with normal expansion.  Cardiovascular exam is regular rate and rhythm. 3/6 systolic murmur left sternal border. S2 is diminished. Abdominal exam nontender or distended. No masses palpated. Extremities show 1+ edema;  dressings in place. neuro grossly intact  ECG sinus rhythm at a rate of 65. Left ventricular hypertrophy with repolarization abnormality. Right bundle branch block. Cannot rule out prior septal infarct.

## 2011-09-09 NOTE — Assessment & Plan Note (Signed)
Blood pressure controlled. Continue present medications. 

## 2011-09-09 NOTE — Patient Instructions (Signed)
Your physician wants you to follow-up in: 6 MONTHS WITH DR Jens Som You will receive a reminder letter in the mail two months in advance. If you don't receive a letter, please call our office to schedule the follow-up appointment.   TAKE EXTRA 20 MG OF FUROSEMIDE DAILY AS NEEDED FOR SWELLING OR SHORTNESS OF BREATH  Your physician recommends that you HAVE LAB WORK TODAY

## 2011-09-09 NOTE — Assessment & Plan Note (Addendum)
The patient has severe aortic stenosis. Given her age and overall medical condition she is not a candidate for valve replacement. They understand this will progress and will ultimately take her life. Conservative measures only. Continue Lasix at present dose. Take additional 20 mg daily when necessary increased dyspnea or edema. No plans to repeat echocardiogram. Patient requests no CODE BLUE status including no defibrillation, CPR or intubation. I have not added an ACE inhibitor or beta blocker previously as her blood pressure is low normal and she has severe aortic stenosis. Check potassium and renal function.

## 2011-09-16 ENCOUNTER — Other Ambulatory Visit (INDEPENDENT_AMBULATORY_CARE_PROVIDER_SITE_OTHER): Payer: Medicare Other

## 2011-09-16 DIAGNOSIS — E875 Hyperkalemia: Secondary | ICD-10-CM

## 2011-09-16 LAB — BASIC METABOLIC PANEL
BUN: 18 mg/dL (ref 6–23)
Calcium: 9.6 mg/dL (ref 8.4–10.5)
Creatinine, Ser: 1 mg/dL (ref 0.4–1.2)
GFR: 56.9 mL/min — ABNORMAL LOW (ref >60.00)
Potassium: 3.2 mEq/L — ABNORMAL LOW (ref 3.5–5.1)

## 2011-09-23 ENCOUNTER — Telehealth: Payer: Self-pay | Admitting: Cardiology

## 2011-09-23 ENCOUNTER — Other Ambulatory Visit: Payer: Self-pay | Admitting: *Deleted

## 2011-09-23 DIAGNOSIS — E876 Hypokalemia: Secondary | ICD-10-CM

## 2011-09-23 NOTE — Telephone Encounter (Signed)
New Problem:    Patient's daughter called to find out about her mother's recent blood work.

## 2011-09-23 NOTE — Telephone Encounter (Signed)
Spoke with pt dtr, aware of lab results. 

## 2011-09-30 ENCOUNTER — Other Ambulatory Visit: Payer: Self-pay

## 2011-09-30 ENCOUNTER — Encounter (HOSPITAL_BASED_OUTPATIENT_CLINIC_OR_DEPARTMENT_OTHER): Payer: Medicare Other | Attending: General Surgery

## 2011-09-30 ENCOUNTER — Other Ambulatory Visit: Payer: Self-pay | Admitting: Internal Medicine

## 2011-09-30 DIAGNOSIS — I872 Venous insufficiency (chronic) (peripheral): Secondary | ICD-10-CM | POA: Insufficient documentation

## 2011-09-30 DIAGNOSIS — I509 Heart failure, unspecified: Secondary | ICD-10-CM

## 2011-09-30 DIAGNOSIS — L97309 Non-pressure chronic ulcer of unspecified ankle with unspecified severity: Secondary | ICD-10-CM | POA: Insufficient documentation

## 2011-10-03 ENCOUNTER — Emergency Department (HOSPITAL_COMMUNITY): Payer: Medicare Other

## 2011-10-03 ENCOUNTER — Encounter (HOSPITAL_COMMUNITY): Payer: Self-pay | Admitting: Family Medicine

## 2011-10-03 ENCOUNTER — Other Ambulatory Visit: Payer: Self-pay

## 2011-10-03 ENCOUNTER — Inpatient Hospital Stay (HOSPITAL_COMMUNITY)
Admission: EM | Admit: 2011-10-03 | Discharge: 2011-10-07 | DRG: 291 | Disposition: A | Discharge status: Skilled Nursing Facility | Payer: Medicare Other | Attending: Internal Medicine | Admitting: Internal Medicine

## 2011-10-03 DIAGNOSIS — I10 Essential (primary) hypertension: Secondary | ICD-10-CM | POA: Diagnosis present

## 2011-10-03 DIAGNOSIS — I5023 Acute on chronic systolic (congestive) heart failure: Principal | ICD-10-CM | POA: Diagnosis present

## 2011-10-03 DIAGNOSIS — J4489 Other specified chronic obstructive pulmonary disease: Secondary | ICD-10-CM | POA: Diagnosis present

## 2011-10-03 DIAGNOSIS — J449 Chronic obstructive pulmonary disease, unspecified: Secondary | ICD-10-CM | POA: Diagnosis present

## 2011-10-03 DIAGNOSIS — I359 Nonrheumatic aortic valve disorder, unspecified: Secondary | ICD-10-CM | POA: Diagnosis present

## 2011-10-03 DIAGNOSIS — I872 Venous insufficiency (chronic) (peripheral): Secondary | ICD-10-CM | POA: Diagnosis present

## 2011-10-03 DIAGNOSIS — M109 Gout, unspecified: Secondary | ICD-10-CM | POA: Diagnosis present

## 2011-10-03 DIAGNOSIS — L97309 Non-pressure chronic ulcer of unspecified ankle with unspecified severity: Secondary | ICD-10-CM | POA: Diagnosis present

## 2011-10-03 DIAGNOSIS — Z66 Do not resuscitate: Secondary | ICD-10-CM | POA: Diagnosis present

## 2011-10-03 DIAGNOSIS — J189 Pneumonia, unspecified organism: Secondary | ICD-10-CM | POA: Diagnosis present

## 2011-10-03 DIAGNOSIS — E039 Hypothyroidism, unspecified: Secondary | ICD-10-CM | POA: Diagnosis present

## 2011-10-03 DIAGNOSIS — I509 Heart failure, unspecified: Secondary | ICD-10-CM | POA: Diagnosis present

## 2011-10-03 LAB — BLOOD GAS, ARTERIAL
Acid-Base Excess: 3.2 mmol/L — ABNORMAL HIGH (ref 0.0–2.0)
Bicarbonate: 27 mEq/L — ABNORMAL HIGH (ref 20.0–24.0)
O2 Saturation: 99.8 %
TCO2: 24.3 mmol/L (ref 0–100)
pO2, Arterial: 495 mmHg — ABNORMAL HIGH (ref 80.0–100.0)

## 2011-10-03 LAB — BASIC METABOLIC PANEL
CO2: 28 mEq/L (ref 19–32)
Calcium: 10 mg/dL (ref 8.4–10.5)
Creatinine, Ser: 0.99 mg/dL (ref 0.50–1.10)
Glucose, Bld: 106 mg/dL — ABNORMAL HIGH (ref 70–99)

## 2011-10-03 LAB — CBC
MCH: 32.8 pg (ref 26.0–34.0)
MCV: 100.6 fL — ABNORMAL HIGH (ref 78.0–100.0)
Platelets: 299 10*3/uL (ref 150–400)
RDW: 16.8 % — ABNORMAL HIGH (ref 11.5–15.5)

## 2011-10-03 LAB — PRO B NATRIURETIC PEPTIDE: Pro B Natriuretic peptide (BNP): 8362 pg/mL — ABNORMAL HIGH (ref 0–450)

## 2011-10-03 LAB — POCT I-STAT TROPONIN I

## 2011-10-03 MED ORDER — LEVOFLOXACIN IN D5W 750 MG/150ML IV SOLN
750.0000 mg | INTRAVENOUS | Status: DC
  Administered 2011-10-05: 750 mg via INTRAVENOUS
  Filled 2011-10-03: qty 150

## 2011-10-03 MED ORDER — ACETAMINOPHEN 650 MG RE SUPP: 650.0000 mg | Freq: Four times a day (QID) | RECTAL | Status: DC | PRN

## 2011-10-03 MED ORDER — ONDANSETRON HCL 4 MG/2ML IJ SOLN: 4.0000 mg | Freq: Four times a day (QID) | INTRAMUSCULAR | Status: DC | PRN

## 2011-10-03 MED ORDER — ASPIRIN EC 81 MG PO TBEC
81.0000 mg | DELAYED_RELEASE_TABLET | Freq: Every day | ORAL | Status: DC
  Administered 2011-10-04 – 2011-10-07 (×4): 81 mg via ORAL
  Filled 2011-10-03 (×4): qty 1

## 2011-10-03 MED ORDER — PANTOPRAZOLE SODIUM 40 MG PO TBEC
40.0000 mg | DELAYED_RELEASE_TABLET | Freq: Every day | ORAL | Status: DC
  Administered 2011-10-04 – 2011-10-07 (×4): 40 mg via ORAL
  Filled 2011-10-03 (×4): qty 1

## 2011-10-03 MED ORDER — LORATADINE 10 MG PO TABS
10.0000 mg | ORAL_TABLET | Freq: Every day | ORAL | Status: DC
  Administered 2011-10-04 – 2011-10-07 (×4): 10 mg via ORAL
  Filled 2011-10-03 (×4): qty 1

## 2011-10-03 MED ORDER — ALBUTEROL SULFATE (5 MG/ML) 0.5% IN NEBU
2.5000 mg | INHALATION_SOLUTION | Freq: Once | RESPIRATORY_TRACT | Status: AC
  Administered 2011-10-03: 2.5 mg via RESPIRATORY_TRACT
  Filled 2011-10-03: qty 0.5

## 2011-10-03 MED ORDER — TRAMADOL-ACETAMINOPHEN 37.5-325 MG PO TABS
1.0000 | ORAL_TABLET | Freq: Four times a day (QID) | ORAL | Status: DC | PRN
  Administered 2011-10-05 – 2011-10-06 (×2): 1 via ORAL
  Filled 2011-10-03 (×3): qty 1

## 2011-10-03 MED ORDER — LEVOFLOXACIN IN D5W 750 MG/150ML IV SOLN
750.0000 mg | Freq: Once | INTRAVENOUS | Status: AC
  Administered 2011-10-03: 750 mg via INTRAVENOUS
  Filled 2011-10-03: qty 150

## 2011-10-03 MED ORDER — SODIUM CHLORIDE 0.9 % IJ SOLN
3.0000 mL | Freq: Two times a day (BID) | INTRAMUSCULAR | Status: DC
  Administered 2011-10-03 – 2011-10-07 (×8): 3 mL via INTRAVENOUS

## 2011-10-03 MED ORDER — SODIUM CHLORIDE 0.9 % IJ SOLN
3.0000 mL | Freq: Two times a day (BID) | INTRAMUSCULAR | Status: DC
  Administered 2011-10-03 – 2011-10-05 (×4): 3 mL via INTRAVENOUS

## 2011-10-03 MED ORDER — FUROSEMIDE 10 MG/ML IJ SOLN
40.0000 mg | Freq: Every day | INTRAMUSCULAR | Status: DC
  Administered 2011-10-04: 40 mg via INTRAVENOUS
  Filled 2011-10-03: qty 4

## 2011-10-03 MED ORDER — POTASSIUM CHLORIDE CRYS ER 20 MEQ PO TBCR
20.0000 meq | EXTENDED_RELEASE_TABLET | Freq: Every day | ORAL | Status: DC
  Administered 2011-10-04 – 2011-10-07 (×4): 20 meq via ORAL
  Filled 2011-10-03 (×4): qty 1

## 2011-10-03 MED ORDER — ALBUTEROL SULFATE (5 MG/ML) 0.5% IN NEBU: 2.5000 mg | INHALATION_SOLUTION | RESPIRATORY_TRACT | Status: DC | PRN

## 2011-10-03 MED ORDER — ALBUTEROL SULFATE (5 MG/ML) 0.5% IN NEBU
2.5000 mg | INHALATION_SOLUTION | Freq: Four times a day (QID) | RESPIRATORY_TRACT | Status: DC
  Administered 2011-10-04 (×4): 2.5 mg via RESPIRATORY_TRACT
  Filled 2011-10-03 (×5): qty 0.5

## 2011-10-03 MED ORDER — FUROSEMIDE 10 MG/ML IJ SOLN
20.0000 mg | Freq: Once | INTRAMUSCULAR | Status: AC
  Administered 2011-10-03: 20 mg via INTRAVENOUS
  Filled 2011-10-03: qty 4

## 2011-10-03 MED ORDER — ONDANSETRON HCL 4 MG PO TABS: 4.0000 mg | ORAL_TABLET | Freq: Four times a day (QID) | ORAL | Status: DC | PRN

## 2011-10-03 MED ORDER — CYANOCOBALAMIN 1000 MCG/ML IJ SOLN
1000.0000 ug | INTRAMUSCULAR | Status: DC
  Administered 2011-10-04: 1000 ug via INTRAMUSCULAR
  Filled 2011-10-03: qty 1

## 2011-10-03 MED ORDER — FUROSEMIDE 10 MG/ML IJ SOLN
INTRAMUSCULAR | Status: AC
  Administered 2011-10-03: 20:00:00
  Filled 2011-10-03: qty 4

## 2011-10-03 MED ORDER — TRIAMCINOLONE ACETONIDE 0.1 % EX CREA
TOPICAL_CREAM | Freq: Two times a day (BID) | CUTANEOUS | Status: DC
  Filled 2011-10-03: qty 15

## 2011-10-03 MED ORDER — LEVOTHYROXINE SODIUM 75 MCG PO TABS
75.0000 ug | ORAL_TABLET | Freq: Every day | ORAL | Status: DC
  Administered 2011-10-04 – 2011-10-07 (×4): 75 ug via ORAL
  Filled 2011-10-03 (×5): qty 1

## 2011-10-03 MED ORDER — ACETAMINOPHEN 325 MG PO TABS: 650.0000 mg | ORAL_TABLET | Freq: Four times a day (QID) | ORAL | Status: DC | PRN

## 2011-10-03 MED ORDER — CALCITRIOL 0.25 MCG PO CAPS
0.2500 ug | ORAL_CAPSULE | Freq: Every day | ORAL | Status: DC
  Administered 2011-10-04 – 2011-10-07 (×4): 0.25 ug via ORAL
  Filled 2011-10-03 (×4): qty 1

## 2011-10-03 MED ORDER — FUROSEMIDE 10 MG/ML IJ SOLN
40.0000 mg | Freq: Once | INTRAMUSCULAR | Status: AC
  Administered 2011-10-03: 40 mg via INTRAVENOUS
  Filled 2011-10-03: qty 4

## 2011-10-03 NOTE — Progress Notes (Signed)
ANTIBIOTIC CONSULT NOTE - INITIAL  Pharmacy Consult for Levofloxacin Indication: pneumonia  Allergies  Allergen Reactions  . Cephalexin     REACTION: vomit  . Doxycycline Hyclate     REACTION: vomit  . Sulfamethoxazole W-Trimethoprim     Patient Measurements: Height: 4' 11.84" (152 cm) Weight: 114 lb 10.2 oz (52 kg) IBW/kg (Calculated) : 45.14  Adjusted Body Weight:   Vital Signs: Temp: 98.5 F (36.9 C) (05/20 2345) Temp src: Oral (05/20 2345) BP: 129/72 mmHg (05/20 2345) Pulse Rate: 86  (05/20 2345) Intake/Output from previous day:   Intake/Output from this shift:    Labs:  [facility] 10/03/11 2054  WBC 10.4  HGB 10.8*  PLT 299  LABCREA --  CREATININE 0.99   Estimated Creatinine Clearance: 25.3 ml/min (by C-G formula based on Cr of 0.99). No results found for this basename: VANCOTROUGH:2,VANCOPEAK:2,VANCORANDOM:2,GENTTROUGH:2,GENTPEAK:2,GENTRANDOM:2,TOBRATROUGH:2,TOBRAPEAK:2,TOBRARND:2,AMIKACINPEAK:2,AMIKACINTROU:2,AMIKACIN:2, in the last 72 hours   Microbiology: No results found for this or any previous visit (from the past 720 hour(s)).  Medical History: Past Medical History  Diagnosis Date  . HYPOTHYROIDISM 10/11/2006  . VITAMIN B12 DEFICIENCY 10/11/2006  . HYPONATREMIA 07/23/2007  . ANEMIA-NOS 09/14/2007  . HYPERTENSION 10/11/2006  . AORTIC STENOSIS 02/23/2007  . LEFT BUNDLE BRANCH BLOCK 10/11/2006  . CONGESTIVE HEART FAILURE 07/23/2007  . COPD 07/23/2007  . OSTEOPOROSIS 02/23/2007  . OSTEOPENIA 10/11/2006  . SAH (subarachnoid hemorrhage) 09/29/2010  . SDH (subdural hematoma) 09/29/2010  . GERD (gastroesophageal reflux disease) 09/29/2010  . DJD (degenerative joint disease) 09/29/2010    Medications:  Anti-infectives     Start     Dose/Rate Route Frequency Ordered Stop   10/05/11 2200   levofloxacin (LEVAQUIN) IVPB 750 mg        750 mg 100 mL/hr over 90 Minutes Intravenous Every 48 hours 10/03/11 2355     10/03/11 2230   levofloxacin (LEVAQUIN) IVPB 750 mg         750 mg 100 mL/hr over 90 Minutes Intravenous  Once 10/03/11 2216           Assessment: Patient with PNA and poor renal function.  First dose of antibiotics already given in ED.   Goal of Therapy:  Levofloxacin dosed based on patient weight and renal function  Plan:  Levofloxacin 750mg  iv q48hr  Theresa Hill 10/03/2011,11:57 PM

## 2011-10-03 NOTE — ED Notes (Signed)
MD at bedside. 

## 2011-10-03 NOTE — ED Notes (Signed)
Per EMS: Pt from home, lives with daughter. Reports pt struggling to breathe today after daughter got home from work, was like this for approx 2-3 hours. Pt placed on cpap pta. Denies any pain. 18 G IV started to lac. Pt received 1 nitro tab and 25 mg lasix pta.

## 2011-10-03 NOTE — ED Notes (Signed)
Pt O2 sat 100% on Venti mask at 10 liters

## 2011-10-03 NOTE — ED Notes (Signed)
Daughter and son in law leaving. Contact number [phone] cell. [phone] home.

## 2011-10-03 NOTE — ED Notes (Signed)
VOZ:DGUY<QI> Expected date:<BR> Expected time: 7:22 PM<BR> Means of arrival:Ambulance<BR> Comments:<BR> M31 -- CHF/CPAP

## 2011-10-03 NOTE — ED Provider Notes (Signed)
History     CSN: [id number] Arrival date & time 10/03/11  1924 First MD Initiated Contact with Patient 10/03/11 1959    Chief Complaint  Patient presents with  . Shortness of Breath    Pt on cpap   HPI The patient presented to the emergency room with shortness of breath. She had been doing relatively well yesterday with a slight cough. When her daughter arrived home from work today the patient was noted to have significant difficulty with her breathing. This lasted for a few hours and they called 911 and became mor severe. She denies any chest pain. She denies any recent fevers. She does not known history of severe aortic stenosis and congestive heart failure. EMS arrived and treated her with BiPAP. Patient is feeling better after that treatment.  Past Medical History  Diagnosis Date  . HYPOTHYROIDISM 10/11/2006  . VITAMIN B12 DEFICIENCY 10/11/2006  . HYPONATREMIA 07/23/2007  . ANEMIA-NOS 09/14/2007  . HYPERTENSION 10/11/2006  . AORTIC STENOSIS 02/23/2007  . LEFT BUNDLE BRANCH BLOCK 10/11/2006  . CONGESTIVE HEART FAILURE 07/23/2007  . COPD 07/23/2007  . OSTEOPOROSIS 02/23/2007  . OSTEOPENIA 10/11/2006  . SAH (subarachnoid hemorrhage) 09/29/2010  . SDH (subdural hematoma) 09/29/2010  . GERD (gastroesophageal reflux disease) 09/29/2010  . DJD (degenerative joint disease) 09/29/2010    Past Surgical History  Procedure Date  . Tonsillectomy   . Abdominal hysterectomy   . Cataract surgery   . Appendectomy     Family History  Problem Relation Age of Onset  . Thyroid disease Mother   . Thyroid disease Maternal Grandmother   . Cancer Other     breast  . Diabetes Other     History  Substance Use Topics  . Smoking status: Never Smoker   . Smokeless tobacco: Not on file  . Alcohol Use: No    OB History    Grav Para Term Preterm Abortions TAB SAB Ect Mult Living                  Review of Systems  All other systems reviewed and are negative.    Allergies  Cephalexin;  Doxycycline hyclate; and Sulfamethoxazole w-trimethoprim  Home Medications   Current Outpatient Rx  Name Route Sig Dispense Refill  . ALENDRONATE SODIUM 70 MG PO TABS  TAKE 1 TABLET BY MOUTH EVERY WEEK 4 tablet 3  . ASPIRIN 81 MG PO TBDP Oral Take 81 mg by mouth daily.      . ESTER C PO Oral Take 1,000 mg by mouth daily.      Marland Kitchen CALCITRIOL 0.25 MCG PO CAPS  take 1 capsule by mouth once daily 30 capsule 11  . CALCIUM CARBONATE 600 MG PO TABS Oral Take 600 mg by mouth daily.      . CYANOCOBALAMIN 1000 MCG/ML IJ SOLN Intramuscular Inject 1,000 mcg into the muscle every 30 (thirty) days.      Marland Kitchen FEXOFENADINE HCL 180 MG PO TABS Oral Take 180 mg by mouth daily.      . FUROSEMIDE 40 MG PO TABS  1-2 tablets by mouth as needed per Cardiology 40 tablet 5  . KLOR-CON M20 20 MEQ PO TBCR  TAKE 1 TABLET BY MOUTH ONCE DAILY 90 tablet 3  . LEVOTHYROXINE SODIUM 75 MCG PO TABS  take 1 tablet by mouth once daily 90 tablet 3  . CENTRUM PO Oral Take by mouth daily.      Marland Kitchen OMEPRAZOLE 20 MG PO CPDR  take 1 capsule by mouth  once daily 90 capsule 3  . TRAMADOL-ACETAMINOPHEN 37.5-325 MG PO TABS  take 1 to 2 tablets by mouth four times a day if needed 120 tablet 2  . TRIAMCINOLONE ACETONIDE 0.1 % EX CREA Topical Apply topically 2 (two) times daily.        BP 118/66  Pulse 73  Resp 24  SpO2 100%  Physical Exam  Nursing note and vitals reviewed. Constitutional: She appears well-developed and well-nourished. No distress.       Comfortable with her breathing while using bipap  HENT:  Head: Normocephalic and atraumatic.  Right Ear: External ear normal.  Left Ear: External ear normal.  Eyes: Conjunctivae are normal. Right eye exhibits no discharge. Left eye exhibits no discharge. No scleral icterus.  Neck: Neck supple. No tracheal deviation present.  Cardiovascular: Normal rate, regular rhythm and intact distal pulses.   Pulmonary/Chest: Effort normal. No stridor. No respiratory distress. She has no wheezes. She  has rales.  Abdominal: Soft. Bowel sounds are normal. She exhibits no distension. There is no tenderness. There is no rebound and no guarding.  Musculoskeletal: She exhibits edema. She exhibits no tenderness.       Lower extrem in wound care dressings  Neurological: She is alert. She has normal strength. No sensory deficit. Cranial nerve deficit:  no gross defecits noted. She exhibits normal muscle tone. She displays no seizure activity. Coordination normal.  Skin: Skin is warm and dry. No rash noted.  Psychiatric: She has a normal mood and affect.    ED Course  Procedures (including critical care time)  Labs Reviewed  PRO B NATRIURETIC PEPTIDE - Abnormal; Notable for the following:    Pro B Natriuretic peptide (BNP) 8362.0 (*)    All other components within normal limits  BASIC METABOLIC PANEL - Abnormal; Notable for the following:    Glucose, Bld 106 (*)    GFR calc non Af Amer 48 (*)    GFR calc Af Amer 55 (*)    All other components within normal limits  CBC - Abnormal; Notable for the following:    RBC 3.29 (*)    Hemoglobin 10.8 (*)    HCT 33.1 (*)    MCV 100.6 (*)    RDW 16.8 (*)    All other components within normal limits  BLOOD GAS, ARTERIAL - Abnormal; Notable for the following:    pH, Arterial 7.443 (*)    pO2, Arterial 495.0 (*)    Bicarbonate 27.0 (*)    Acid-Base Excess 3.2 (*)    All other components within normal limits  POCT I-STAT TROPONIN I   Dg Chest Port 1 View  10/03/2011  *RADIOLOGY REPORT*  Clinical Data: Shortness of breath.  PORTABLE CHEST - 1 VIEW  Comparison: 04/01/2011  Findings: Kyphoscoliosis is present.  Heart size appears stable. Left lung is probably clear.  There is minimal density at the right lung base, consistent with atelectasis or developing infiltrate. There may be mild interstitial edema when compared with multiple prior studies.  IMPRESSION:  1.  Suspect right lower lobe atelectasis or developing infiltrate. 2.  Probable interstitial  pulmonary edema.  Original Report Authenticated By: Patterson Hammersmith, M.D.     1. Congestive heart failure   2. Community acquired pneumonia       MDM  Patient's chest x-ray suggests the possibility of pulmonary edema as well as community acquired pneumonia. Patient's BNP is significantly elevated. I suspect that she does have a component of congestive heart failure. I  reviewed her old records. Patient has known CHF and severe aortic stenosis.  She's not a candidate for aortic valve surgery and per the outpatient records patient is DO NOT RESUSCITATE. Will consult the hospitalist for admission and further treatment. I will give her a small dose of Lasix for gentle diuresis. I ordered antibiotics to cover for 18 acquired pneumonia.        Celene Kras, MD 10/03/11 2228

## 2011-10-03 NOTE — ED Notes (Signed)
RT spoke with Roselyn Bering, MD. Blood work looks good and patient is able to be weaned off Bipap at this time.

## 2011-10-04 DIAGNOSIS — E032 Hypothyroidism due to medicaments and other exogenous substances: Secondary | ICD-10-CM

## 2011-10-04 DIAGNOSIS — D696 Thrombocytopenia, unspecified: Secondary | ICD-10-CM

## 2011-10-04 DIAGNOSIS — I359 Nonrheumatic aortic valve disorder, unspecified: Secondary | ICD-10-CM

## 2011-10-04 DIAGNOSIS — I509 Heart failure, unspecified: Secondary | ICD-10-CM

## 2011-10-04 LAB — COMPREHENSIVE METABOLIC PANEL
ALT: 11 U/L (ref 0–35)
AST: 20 U/L (ref 0–37)
Albumin: 2.8 g/dL — ABNORMAL LOW (ref 3.5–5.2)
Alkaline Phosphatase: 50 U/L (ref 39–117)
Calcium: 9.6 mg/dL (ref 8.4–10.5)
Potassium: 3.4 mEq/L — ABNORMAL LOW (ref 3.5–5.1)
Sodium: 139 mEq/L (ref 135–145)
Total Protein: 6.5 g/dL (ref 6.0–8.3)

## 2011-10-04 LAB — CBC
HCT: 29.3 % — ABNORMAL LOW (ref 36.0–46.0)
Hemoglobin: 9.6 g/dL — ABNORMAL LOW (ref 12.0–15.0)
MCH: 32.7 pg (ref 26.0–34.0)
MCHC: 32.8 g/dL (ref 30.0–36.0)
MCV: 99.7 fL (ref 78.0–100.0)

## 2011-10-04 LAB — CARDIAC PANEL(CRET KIN+CKTOT+MB+TROPI): CK, MB: 3.1 ng/mL (ref 0.3–4.0)

## 2011-10-04 MED ORDER — ENOXAPARIN SODIUM 40 MG/0.4ML ~~LOC~~ SOLN: 40.0000 mg | SUBCUTANEOUS | Status: DC

## 2011-10-04 MED ORDER — FUROSEMIDE 40 MG PO TABS
40.0000 mg | ORAL_TABLET | Freq: Every day | ORAL | Status: DC
  Filled 2011-10-04: qty 1

## 2011-10-04 MED ORDER — IPRATROPIUM BROMIDE 0.02 % IN SOLN: 0.5000 mg | RESPIRATORY_TRACT | Status: DC | PRN

## 2011-10-04 MED ORDER — ENOXAPARIN SODIUM 30 MG/0.3ML ~~LOC~~ SOLN
30.0000 mg | SUBCUTANEOUS | Status: DC
  Administered 2011-10-04 – 2011-10-07 (×4): 30 mg via SUBCUTANEOUS
  Filled 2011-10-04 (×4): qty 0.3

## 2011-10-04 MED ORDER — SODIUM CHLORIDE 0.9 % IV BOLUS (SEPSIS)
250.0000 mL | Freq: Once | INTRAVENOUS | Status: AC
  Administered 2011-10-04: 250 mL via INTRAVENOUS

## 2011-10-04 MED ORDER — ALBUTEROL SULFATE (5 MG/ML) 0.5% IN NEBU: 2.5000 mg | INHALATION_SOLUTION | RESPIRATORY_TRACT | Status: DC | PRN

## 2011-10-04 NOTE — Consult Note (Signed)
WOC consult Note Reason for Consult: Consult requested for BLE wounds.  Pt's family at bedside able to provide information on wound care.  Pt is followed by the outpatient wound care center with silver Hydrofiber dressings and coban.  She was started on antibiotics last week for a wound infection.  Dressings were applied last Friday and have had large amt odor and strike through drainage since that time, according to daughter.  These are usually changed Q week. Will remain with present plan of topical care but will change daily while in hospital to reduce drainage to wound bed. Wound type: Full thickness stasis ulcers Pressure Ulcer POA: No Measurement:Left outer ankle partial thickness 1X.5X.1cm, yellow wound bed, generalized erythremia surrounding wound.  LARGE amt green drainage with odor consistent with pseudomonas. Right inner ankle 4X3X.2cm with yellow wound bed and partially visible tendon. Large amt thick tan drainage with green halo and same odor as left leg. Right outer ankle 4X2X.2cm with yellow wound bed.  Large amt thick tan drainage with green halo and same odor as left leg.  Dressing procedure/placement/frequency: Bedside nurse to change  Aquacel to absorb drainage and provide antimicrobial benefits.  Contact layer of Me[pitel to protect tendon on right inner ankle area.  Ace wrap for light compression.  PA in room to assess wounds, pt already on antibiotics.  Can resume follow-up at outpatient wound center after discharge.  Will not plan to follow further unless re-consulted.  [address] RN, MSN, [employer]  [phone]

## 2011-10-04 NOTE — H&P (Signed)
Theresa Hill is an 78 y.o. female.   PCP - Dr.James John. Cardiologist - Dr.Crenshaw. Chief Complaint: Shortness of breath. HPI: 78 year old female with known history of severe aortic stenosis and systolic heart failure with last EF measured in June 2012 was 40% and as per cardiology not a candidate for valve replacement started developing sudden onset of shortness of breath last evening and was brought to the ER. Chest x-ray revealed pulmonary edema pattern and also possible infiltrates. Patient was initially placed on BiPAP and after Lasix patient was off the BiPAP and presently admitted for decompensated CHF with possible pneumonia. Patient denies any chest pain nausea vomiting or abdominal pain. Patient has been having chronic lower extremity wounds for which she is being followed at wound center and was recently placed on antibiotics.  Past Medical History  Diagnosis Date  . HYPOTHYROIDISM 10/11/2006  . VITAMIN B12 DEFICIENCY 10/11/2006  . HYPONATREMIA 07/23/2007  . ANEMIA-NOS 09/14/2007  . HYPERTENSION 10/11/2006  . AORTIC STENOSIS 02/23/2007  . LEFT BUNDLE BRANCH BLOCK 10/11/2006  . CONGESTIVE HEART FAILURE 07/23/2007  . COPD 07/23/2007  . OSTEOPOROSIS 02/23/2007  . OSTEOPENIA 10/11/2006  . SAH (subarachnoid hemorrhage) 09/29/2010  . SDH (subdural hematoma) 09/29/2010  . GERD (gastroesophageal reflux disease) 09/29/2010  . DJD (degenerative joint disease) 09/29/2010    Past Surgical History  Procedure Date  . Tonsillectomy   . Abdominal hysterectomy   . Cataract surgery   . Appendectomy     Family History  Problem Relation Age of Onset  . Thyroid disease Mother   . Thyroid disease Maternal Grandmother   . Cancer Other     breast  . Diabetes Other    Social History:  reports that she has never smoked. She does not have any smokeless tobacco history on file. She reports that she does not drink alcohol or use illicit drugs.  Allergies:  Allergies  Allergen Reactions  . Cephalexin       REACTION: vomit  . Doxycycline Hyclate     REACTION: vomit  . Sulfamethoxazole W-Trimethoprim      (Not in a hospital admission)  Results for orders placed during the hospital encounter of 10/03/11 (from the past 48 hour(s))  BLOOD GAS, ARTERIAL     Status: Abnormal   Collection Time   10/03/11  8:45 PM      Component Value Range Comment   FIO2 1.00      Delivery systems BILEVEL POSITIVE AIRWAY PRESSURE      Inspiratory PAP 10      Expiratory PAP 5      pH, Arterial 7.443 (*) 7.350 - 7.400     pCO2 arterial 40.1  35.0 - 45.0 (mmHg)    pO2, Arterial 495.0 (*) 80.0 - 100.0 (mmHg)    Bicarbonate 27.0 (*) 20.0 - 24.0 (mEq/L)    TCO2 24.3  0 - 100 (mmol/L)    Acid-Base Excess 3.2 (*) 0.0 - 2.0 (mmol/L)    O2 Saturation 99.8      Patient temperature 98.6      Collection site LEFT RADIAL      Drawn by [id number]      Sample type ARTERIAL      Allens test (pass/fail) PASS  PASS    PRO B NATRIURETIC PEPTIDE     Status: Abnormal   Collection Time   10/03/11  8:54 PM      Component Value Range Comment   Pro B Natriuretic peptide (BNP) 8362.0 (*) 0 - 450 (pg/mL)  BASIC METABOLIC PANEL     Status: Abnormal   Collection Time   10/03/11  8:54 PM      Component Value Range Comment   Sodium 139  135 - 145 (mEq/L)    Potassium 4.4  3.5 - 5.1 (mEq/L)    Chloride 102  96 - 112 (mEq/L)    CO2 28  19 - 32 (mEq/L)    Glucose, Bld 106 (*) 70 - 99 (mg/dL)    BUN 23  6 - 23 (mg/dL)    Creatinine, Ser 4.09  0.50 - 1.10 (mg/dL)    Calcium 81.1  8.4 - 10.5 (mg/dL)    GFR calc non Af Amer 48 (*) >90 (mL/min)    GFR calc Af Amer 55 (*) >90 (mL/min)   CBC     Status: Abnormal   Collection Time   10/03/11  8:54 PM      Component Value Range Comment   WBC 10.4  4.0 - 10.5 (K/uL)    RBC 3.29 (*) 3.87 - 5.11 (MIL/uL)    Hemoglobin 10.8 (*) 12.0 - 15.0 (g/dL)    HCT 91.4 (*) 78.2 - 46.0 (%)    MCV 100.6 (*) 78.0 - 100.0 (fL)    MCH 32.8  26.0 - 34.0 (pg)    MCHC 32.6  30.0 - 36.0 (g/dL)    RDW  95.6 (*) 21.3 - 15.5 (%)    Platelets 299  150 - 400 (K/uL)   POCT I-STAT TROPONIN I     Status: Normal   Collection Time   10/03/11  9:02 PM      Component Value Range Comment   Troponin i, poc 0.03  0.00 - 0.08 (ng/mL)    Comment 3             Dg Chest Port 1 View  10/03/2011  *RADIOLOGY REPORT*  Clinical Data: Shortness of breath.  PORTABLE CHEST - 1 VIEW  Comparison: 04/01/2011  Findings: Kyphoscoliosis is present.  Heart size appears stable. Left lung is probably clear.  There is minimal density at the right lung base, consistent with atelectasis or developing infiltrate. There may be mild interstitial edema when compared with multiple prior studies.  IMPRESSION:  1.  Suspect right lower lobe atelectasis or developing infiltrate. 2.  Probable interstitial pulmonary edema.  Original Report Authenticated By: Patterson Hammersmith, M.D.    Review of Systems  Constitutional: Negative.   HENT: Negative.   Eyes: Negative.   Respiratory: Positive for cough and shortness of breath.   Cardiovascular: Negative.   Gastrointestinal: Negative.   Genitourinary: Negative.   Musculoskeletal: Negative.   Skin: Negative.   Neurological: Negative.   Endo/Heme/Allergies: Negative.   Psychiatric/Behavioral: Negative.     Blood pressure 129/72, pulse 86, temperature 98.5 F (36.9 C), temperature source Oral, resp. rate 22, height 4' 11.84" (1.52 m), weight 52 kg (114 lb 10.2 oz), SpO2 97.00%. Physical Exam  Constitutional: She is oriented to person, place, and time. She appears well-developed and well-nourished. No distress.  HENT:  Head: Normocephalic and atraumatic.  Right Ear: External ear normal.  Left Ear: External ear normal.  Nose: Nose normal.  Mouth/Throat: Oropharynx is clear and moist. No oropharyngeal exudate.  Eyes: Conjunctivae are normal. Pupils are equal, round, and reactive to light. Right eye exhibits no discharge. Left eye exhibits no discharge. No scleral icterus.       Left eye  opacity.  Neck: Normal range of motion. Neck supple.  Cardiovascular: Normal rate and regular  rhythm.   Murmur heard. Respiratory: Effort normal. No respiratory distress. She has wheezes. She has rales.  GI: Soft. Bowel sounds are normal. She exhibits no distension. There is no tenderness.  Musculoskeletal: She exhibits no edema and no tenderness.  Neurological: She is alert and oriented to person, place, and time.       Moves all extremities.  Skin: She is not diaphoretic.       Both lower extremities dressed.     Assessment/Plan #1. Decompensated CHF with known history of severe aortic stenosis and systolic heart failure last EF was 40% in June 2012 echocardiogram - presently patient is saturating well on 2 L nasal cannula. We will continue with Lasix 40 mg IV and close followup of intake output and metabolic panel. Since patient has severe aortic stenosis careful not to over diurese. Patient is also wheezing which could be from CHF we will also add albuterol nebulizers. #2. Possible pneumonia - chest x-ray shows possible infiltrates and patient has been started on Levaquin per pharmacy which we will continue. #3. Chronic lower extremity wounds - consult wound team. #4. Hypothyroidism - check TSH. Continue Synthroid.  CODE STATUS - DO NOT RESUSCITATE as confirmed with patient's granddaughter Ms. Alice Cockrell who can be reached at [phone].  Roneshia Drew N. 10/04/2011, 12:10 AM

## 2011-10-04 NOTE — Progress Notes (Signed)
   CARE MANAGEMENT NOTE 10/04/2011  Patient:  Theresa Hill, Theresa Hill   Account Number:  [id number]  Date Initiated:  10/04/2011  Documentation initiated by:  Jiles Crocker  Subjective/Objective Assessment:   ADMITTED WITH PNEUMONIA, CHF     Action/Plan:   PCP - Dr.James John.  Cardiologist - Dr.Crenshaw.; LIVES AT HOME WITH FAMILY   Anticipated DC Date:  10/11/2011   Anticipated DC Plan:  HOME W HOME HEALTH SERVICES          Status of service:  In process, will continue to follow Medicare Important Message given?  NA - LOS <3 / Initial given by admissions (If response is "NO", the following Medicare IM given date fields will be blank)  Per UR Regulation:  Reviewed for med. necessity/level of care/duration of stay  Comments:  10/04/2011- B Milayah Krell RN, BSN, MHA

## 2011-10-04 NOTE — Progress Notes (Signed)
Subjective: Awake, denies SOB. Reports some "aching in my legs". NAD  Objective: Vital signs Filed Vitals:   10/04/11 0112 10/04/11 0300 10/04/11 0530 10/04/11 0838  BP: 114/64  99/62   Pulse: 76  87   Temp: 98 F (36.7 C)  97.9 F (36.6 C)   TempSrc: Oral  Oral   Resp: 20  20   Height:      Weight:   52.3 kg (115 lb 4.8 oz)   SpO2: 99% 100% 100% 99%   Weight change:  Last BM Date: 10/03/11  Intake/Output from previous day: 05/20 0701 - 05/21 0700 In: -  Out: 2550 [Urine:2550] Total I/O In: 240 [P.O.:240] Out: -    Physical Exam: General: Alert, awake, oriented to self and place  in no acute distress. Frail appearing HEENT: No bruits, no goiter. Heart: Regular rate and rhythm, + murmurs without  rubs, gallops. Lungs:Slight increased work of breathing with conversation. Breath sounds coarse with slight wheeze and faint crackles.  Abdomen: Soft, nontender, nondistended, positive bowel sounds. Extremities: No clubbing cyanosis or edema with positive pedal pulses. Bilateral dressings to LE from below knee to ankle intact. Slightly moist and malodorous. Toes warm to touch.  Neuro: Grossly intact, nonfocal.    Lab Results: Basic Metabolic Panel:  Basename 10/04/11 0515 10/03/11 2054  NA 139 139  K 3.4* 4.4  CL 100 102  CO2 28 28  GLUCOSE 82 106*  BUN 21 23  CREATININE 1.02 0.99  CALCIUM 9.6 10.0  MG -- --  PHOS -- --   Liver Function Tests:  Basename 10/04/11 0515  AST 20  ALT 11  ALKPHOS 50  BILITOT 0.6  PROT 6.5  ALBUMIN 2.8*   No results found for this basename: LIPASE:2,AMYLASE:2 in the last 72 hours No results found for this basename: AMMONIA:2 in the last 72 hours CBC:  Basename 10/04/11 0515 10/03/11 2054  WBC 7.8 10.4  NEUTROABS -- --  HGB 9.6* 10.8*  HCT 29.3* 33.1*  MCV 99.7 100.6*  PLT 253 299   Cardiac Enzymes:  Basename 10/04/11 0001  CKTOTAL 49  CKMB 3.1  CKMBINDEX --  TROPONINI <0.30   BNP:  Basename 10/03/11 2054    PROBNP 8362.0*   D-Dimer: No results found for this basename: DDIMER:2 in the last 72 hours CBG: No results found for this basename: GLUCAP:6 in the last 72 hours Hemoglobin A1C: No results found for this basename: HGBA1C in the last 72 hours Fasting Lipid Panel: No results found for this basename: CHOL,HDL,LDLCALC,TRIG,CHOLHDL,LDLDIRECT in the last 72 hours Thyroid Function Tests: No results found for this basename: TSH,T4TOTAL,FREET4,T3FREE,THYROIDAB in the last 72 hours Anemia Panel: No results found for this basename: VITAMINB12,FOLATE,FERRITIN,TIBC,IRON,RETICCTPCT in the last 72 hours Coagulation: No results found for this basename: LABPROT:2,INR:2 in the last 72 hours Urine Drug Screen: Drugs of Abuse  No results found for this basename: labopia, cocainscrnur, labbenz, amphetmu, thcu, labbarb    Alcohol Level: No results found for this basename: ETH:2 in the last 72 hours Urinalysis: No results found for this basename: COLORURINE:2,APPERANCEUR:2,LABSPEC:2,PHURINE:2,GLUCOSEU:2,HGBUR:2,BILIRUBINUR:2,KETONESUR:2,PROTEINUR:2,UROBILINOGEN:2,NITRITE:2,LEUKOCYTESUR:2 in the last 72 hours Misc. Labs:  No results found for this or any previous visit (from the past 240 hour(s)).  Studies/Results: Dg Chest Port 1 View  10/03/2011  *RADIOLOGY REPORT*  Clinical Data: Shortness of breath.  PORTABLE CHEST - 1 VIEW  Comparison: 04/01/2011  Findings: Kyphoscoliosis is present.  Heart size appears stable. Left lung is probably clear.  There is minimal density at the right lung base, consistent with  atelectasis or developing infiltrate. There may be mild interstitial edema when compared with multiple prior studies.  IMPRESSION:  1.  Suspect right lower lobe atelectasis or developing infiltrate. 2.  Probable interstitial pulmonary edema.  Original Report Authenticated By: Patterson Hammersmith, M.D.    Medications: Scheduled Meds:   . albuterol  2.5 mg Nebulization Q6H  . albuterol  2.5 mg  Nebulization Once  . aspirin EC  81 mg Oral Daily  . calcitRIOL  0.25 mcg Oral Daily  . cyanocobalamin  1,000 mcg Intramuscular Q30 days  . enoxaparin (LOVENOX) injection  30 mg Subcutaneous Q24H  . furosemide      . furosemide  20 mg Intravenous Once  . furosemide  40 mg Intravenous Once  . furosemide  40 mg Intravenous Daily  . levofloxacin (LEVAQUIN) IV  750 mg Intravenous Once  . levofloxacin (LEVAQUIN) IV  750 mg Intravenous Q48H  . levothyroxine  75 mcg Oral QAC breakfast  . loratadine  10 mg Oral Daily  . pantoprazole  40 mg Oral Q1200  . potassium chloride SA  20 mEq Oral Daily  . sodium chloride  3 mL Intravenous Q12H  . sodium chloride  3 mL Intravenous Q12H  . triamcinolone cream   Topical BID  . DISCONTD: enoxaparin (LOVENOX) injection  40 mg Subcutaneous Q24H   Continuous Infusions:  PRN Meds:.acetaminophen, acetaminophen, albuterol, ondansetron (ZOFRAN) IV, ondansetron, traMADol-acetaminophen  Assessment/Plan:  Principal Problem:  *CONGESTIVE HEART FAILURE Active Problems:  HYPOTHYROIDISM  HYPERTENSION  AORTIC STENOSIS  COPD  Community acquired pneumonia  #1. Decompensated CHF with known history of severe aortic stenosis and systolic heart failure last EF was 40% in June 2012 echocardiogram - Sats 99-1005 l on 2 L nasal cannula. Continue with Lasix 40 mg IV . Volume status neg 2.3L.  Since patient has severe aortic stenosis careful not to over diurese. Patient is also wheezing which could be from CHF . Will continue  albuterol nebulizers.  #2. Possible pneumonia - chest x-ray shows possible infiltrates and patient has been started on Levaquin per pharmacy which we will continue. Afebrile, WC WNL. Will monitor.  #3. Chronic lower extremity wounds - consult wound team. Has been on doxy recently and is pt at wound clinic.  Dressing changed per wound RN. Right LE wound on medial and lateral aspect just above ankle with thick, greenish drainage large amounts. Foul odor.  Appreciate wound consult. Will continue Levaquin, day #2 . If no improvement will consider broader coverage.  #4. Hypothyroidism - check TSH. Continue Synthroid.  CODE STATUS - DO NOT RESUSCITATE as confirmed with patient's granddaughter Ms. Alice Cockrell who can be reached at [phone].   LOS: 1 day   [facility] M 10/04/2011, 11:54 AM  I have personally examined the patient and reviewed the entire database. Will follow swallow study recommendations. Patient overall improving with diuresis. Wound care consult ordered. Agree with the above note and plan as outlined by Ms. Black.  Kasch Borquez M.D. [facility] 10/04/2011, 2:58 PM

## 2011-10-04 NOTE — Evaluation (Signed)
Clinical/Bedside Swallow Evaluation Patient Details  Name: Theresa Hill MRN: [id number] Date of Birth: 1915/11/21  Today's Date: 10/04/2011 Time: 1223-1308 SLP Time Calculation (min): 45 min  Past Medical History:  Past Medical History  Diagnosis Date  . HYPOTHYROIDISM 10/11/2006  . VITAMIN B12 DEFICIENCY 10/11/2006  . HYPONATREMIA 07/23/2007  . ANEMIA-NOS 09/14/2007  . HYPERTENSION 10/11/2006  . AORTIC STENOSIS 02/23/2007  . LEFT BUNDLE BRANCH BLOCK 10/11/2006  . CONGESTIVE HEART FAILURE 07/23/2007  . COPD 07/23/2007  . OSTEOPOROSIS 02/23/2007  . OSTEOPENIA 10/11/2006  . SAH (subarachnoid hemorrhage) 09/29/2010  . SDH (subdural hematoma) 09/29/2010  . GERD (gastroesophageal reflux disease) 09/29/2010  . DJD (degenerative joint disease) 09/29/2010   Past Surgical History:  Past Surgical History  Procedure Date  . Tonsillectomy   . Abdominal hysterectomy   . Cataract surgery   . Appendectomy    HPI:  78 yo female adm to [facility] with CHF, ?pna. PMH + for GERD, kyphoscholiosis, DJD, COPD (never smoked), HTN.  Pt underwent an esophagram 05/19/2007 limited evaluation, no high grade obstruction or stricture, no obvious mass or stricture, MBS was recommended but not completed.  Daughter and pt recall esophagram being done, but not results.  Pt's daughter reports "walking pna" a few years ago, but denies further episodes.  CXR indcated right lower lobe atx vs infiltrate.  Pt has taken a reflux medicine for the last few years and she and daughter report medicine to be effective to eliminate her reflux symptoms.  Prior to reflux medication intake, pt would cough and expectorate secretions during meals.  Hoarseness noted, which family states "comes and goes" and has worsened in the last few weeks...  ? source.    Family also states pt has allergies resulting in post nasal drip.     Assessment / Plan / Recommendation Clinical Impression  Pt observed eating lunch, states her ability to swallow function is at  baseline.  Minimal weak cough x1 of 9 thin boluses noted, no clinical s/s of aspiration with meat, potatoes, carrots.  Pt reports baseline cough with and without po.  Pt does state occasional "choking" with food more than liquids-suspect pt's kyphosis and known GERD contribute significantly.  Advised family and pt to aspiration precautions to decrease asp risk including monitoring respiratory status and taking rest breaks as needed.  SLP reviewed esophagram from 2009 with pt and family.    As pt has been eating well and tolerating prior to admission, recommend continue diet with precautions in place.  Do not suspect pt is overtly aspirating, but episodic asp will remain a risk due to kyphosis, advanced age, GERD, etc.  Question source of hoarseness. Family and pt agreeable to continue diet with precautions.       Aspiration Risk  Moderate    Diet Recommendation Regular;Thin liquid   Other  Recommendations     Follow Up Recommendations       Frequency and Duration min 1 x/week  1 week   Pertinent Vitals/Pain Afebrile, 75% intake, 2 liters of oxygen    SLP Swallow Goals Patient will utilize recommended strategies during swallow to increase swallowing safety with: Moderate assistance   Swallow Study Prior Functional Status         General HPI: 78 yo female adm to [facility] with CHF, ?pna. PMH + for GERD, kyphoscholiosis, DJD, COPD (never smoked), HTN.  Pt underwent an esophagram 05/19/2007 limited evaluation, no high grade obstruction or stricture, no obvious mass or stricture, MBS was recommended but not completed.  Daughter and pt recall esophagram being done, but not results.  Pt's daughter reports "walking pna" a few years ago, but denies further episodes.  CXR indcated right lower lobe atx vs infiltrate.  Pt has taken a reflux medicine for the last few years and she and daughter report medicine to be effective to eliminate her reflux symptoms.  Prior to reflux medication intake, pt would cough and  expectorate secretions during meals.  Hoarseness noted, which family states "comes and goes" and has worsened in the last few weeks...  ? source.    Family also states pt has allergies resulting in post nasal drip.   Type of Study: Bedside swallow evaluation Previous Swallow Assessment: Esophagram Diet Prior to this Study: Regular;Thin liquids Temperature Spikes Noted: No Respiratory Status: Supplemental O2 delivered via (comment) (no on oxygen at home) Behavior/Cognition: Alert Oral Cavity - Dentition: Dentures, top;Dentures, bottom Vision: Functional for self-feeding Patient Positioning: Postural control interferes with function (kyphosis) Baseline Vocal Quality: Hoarse Volitional Cough: Weak    Oral/Motor/Sensory Function Overall Oral Motor/Sensory Function: Appears within functional limits for tasks assessed   Ice Chips Ice chips: Not tested   Thin Liquid Presentation: Cup;Self Fed Other Comments: subtle cough x1 of approx 9 swallows noted, pt states she coughs with and without po intake    Nectar Thick Nectar Thick Liquid: Not tested   Honey Thick Honey Thick Liquid: Not tested   Puree Puree: Within functional limits Presentation: Self Fed;Spoon   Solid Solid: Within functional limits Presentation: Self Fed;Spoon    Chales Abrahams 10/04/2011,1:28 PM

## 2011-10-05 ENCOUNTER — Inpatient Hospital Stay (HOSPITAL_COMMUNITY): Payer: Medicare Other

## 2011-10-05 DIAGNOSIS — M25469 Effusion, unspecified knee: Secondary | ICD-10-CM

## 2011-10-05 LAB — BASIC METABOLIC PANEL
BUN: 19 mg/dL (ref 6–23)
CO2: 26 mEq/L (ref 19–32)
Chloride: 99 mEq/L (ref 96–112)
Creatinine, Ser: 0.98 mg/dL (ref 0.50–1.10)
Potassium: 3.9 mEq/L (ref 3.5–5.1)

## 2011-10-05 LAB — GRAM STAIN: Special Requests: NORMAL

## 2011-10-05 LAB — SYNOVIAL CELL COUNT + DIFF, W/ CRYSTALS
Eosinophils-Synovial: 0 % (ref 0–1)
Lymphocytes-Synovial Fld: 0 % (ref 0–20)
Monocyte-Macrophage-Synovial Fluid: 25 % — ABNORMAL LOW (ref 50–90)
Neutrophil, Synovial: 75 % — ABNORMAL HIGH (ref 0–25)
Other Cells-SYN: 0

## 2011-10-05 LAB — CBC
HCT: 28.8 % — ABNORMAL LOW (ref 36.0–46.0)
MCV: 97.3 fL (ref 78.0–100.0)
RBC: 2.96 MIL/uL — ABNORMAL LOW (ref 3.87–5.11)
WBC: 10.7 10*3/uL — ABNORMAL HIGH (ref 4.0–10.5)

## 2011-10-05 MED ORDER — COLCHICINE 0.6 MG PO TABS
0.6000 mg | ORAL_TABLET | Freq: Two times a day (BID) | ORAL | Status: DC
  Administered 2011-10-05 – 2011-10-06 (×2): 0.6 mg via ORAL
  Filled 2011-10-05 (×3): qty 1

## 2011-10-05 MED ORDER — FUROSEMIDE 10 MG/ML IJ SOLN
40.0000 mg | Freq: Two times a day (BID) | INTRAMUSCULAR | Status: DC
  Administered 2011-10-05 (×2): 40 mg via INTRAVENOUS
  Filled 2011-10-05 (×5): qty 4

## 2011-10-05 NOTE — Progress Notes (Signed)
Per request from [place], [place], I called and spoke with [name] in the lab.  She will page him with results of body fluid labs (cell count and gram stain) when they are resulted at [phone].  Ardyth Gal, RN 10/05/2011

## 2011-10-05 NOTE — Progress Notes (Signed)
Patient ID: Theresa Hill, female   DOB: 1915-08-22, 78 y.o.   MRN: [id number] Patient remains afebrile and stable. Joint fluid has gout crystals. Will start colchcine , follow culture, and clinical response. At this time no indication for surgical treatment.

## 2011-10-05 NOTE — Progress Notes (Signed)
PCP: Oliver Barre, MD, MD  Brief HPI: 78 year old female with known history of severe aortic stenosis and systolic heart failure with last EF measured in June 2012 was 40% and as per cardiology not a candidate for valve replacement started developing sudden onset of shortness of breath evening before admission and was brought to the ER. Chest x-ray revealed pulmonary edema pattern and also possible infiltrates. Patient was initially placed on BiPAP and after Lasix patient was off the BiPAP and then admitted for decompensated CHF with possible pneumonia. Patient denied any chest pain nausea vomiting or abdominal pain. Patient has been having chronic lower extremity wounds for which she is being followed at wound center and was recently placed on antibiotics.   Past medical history:  Past Medical History  Diagnosis Date  . HYPOTHYROIDISM 10/11/2006  . VITAMIN B12 DEFICIENCY 10/11/2006  . HYPONATREMIA 07/23/2007  . ANEMIA-NOS 09/14/2007  . HYPERTENSION 10/11/2006  . AORTIC STENOSIS 02/23/2007  . LEFT BUNDLE BRANCH BLOCK 10/11/2006  . CONGESTIVE HEART FAILURE 07/23/2007  . COPD 07/23/2007  . OSTEOPOROSIS 02/23/2007  . OSTEOPENIA 10/11/2006  . SAH (subarachnoid hemorrhage) 09/29/2010  . SDH (subdural hematoma) 09/29/2010  . GERD (gastroesophageal reflux disease) 09/29/2010  . DJD (degenerative joint disease) 09/29/2010    Consultants: None  Procedures: None  Subjective: Patient says she is feeling better. But still some short of breath. Denies any CP. Says she walks around in the house. Denies any falls recently.  Objective: Vital signs in last 24 hours: Temp:  [97.8 F (36.6 C)-98.7 F (37.1 C)] 98.7 F (37.1 C) (05/22 0617) Pulse Rate:  [79-83] 79  (05/22 0617) Resp:  [20] 20  (05/22 0617) BP: (90-110)/(52-68) 104/66 mmHg (05/22 0617) SpO2:  [96 %-99 %] 99 % (05/22 0617) FiO2 (%):  [21 %] 21 % (05/21 1518) Weight:  [51 kg (112 lb 7 oz)] 51 kg (112 lb 7 oz) (05/22 0617) Weight change: -1 kg  (-2 lb 3.3 oz) Last BM Date: 10/03/11  Intake/Output from previous day: 05/21 0701 - 05/22 0700 In: 1250 [P.O.:900; IV Piggyback:250] Out: 2650 [Urine:2650] Intake/Output this shift:    General appearance: alert, cooperative, appears stated age and no distress Head: Normocephalic, without obvious abnormality, atraumatic Neck: no adenopathy, no carotid bruit, no JVD, supple, symmetrical, trachea midline and thyroid not enlarged, symmetric, no tenderness/mass/nodules Back: scoliosis is noted Resp: Decreased air entry at the bases with crackles. No wheezing. Cardio: regular rate and rhythm, S1, S2 normal. Systolic murmur over precordium. No click, rub or gallop GI: soft, non-tender; bowel sounds normal; no masses,  no organomegaly Extremities: tender over right knee with decreased ROM. Bryson Ha is swollen. Tender to palpation over the patella. No warmth or erythema. Pulses: 2+ and symmetric Skin: Skin color, texture, turgor normal. No rashes or lesions Lymph nodes: Cervical, supraclavicular, and axillary nodes normal. Neurologic: Grossly normal  Lab Results:  Basename 10/05/11 0533 10/04/11 0515  WBC 10.7* 7.8  HGB 9.6* 9.6*  HCT 28.8* 29.3*  PLT 249 253   BMET  Basename 10/05/11 0533 10/04/11 0515  NA 134* 139  K 3.9 3.4*  CL 99 100  CO2 26 28  GLUCOSE 104* 82  BUN 19 21  CREATININE 0.98 1.02  CALCIUM 9.0 9.6  ALT -- 11    Studies/Results: Dg Chest Port 1 View  10/03/2011  *RADIOLOGY REPORT*  Clinical Data: Shortness of breath.  PORTABLE CHEST - 1 VIEW  Comparison: 04/01/2011  Findings: Kyphoscoliosis is present.  Heart size appears stable. Left lung  is probably clear.  There is minimal density at the right lung base, consistent with atelectasis or developing infiltrate. There may be mild interstitial edema when compared with multiple prior studies.  IMPRESSION:  1.  Suspect right lower lobe atelectasis or developing infiltrate. 2.  Probable interstitial pulmonary edema.   Original Report Authenticated By: Patterson Hammersmith, M.D.    Medications:  Scheduled:   . aspirin EC  81 mg Oral Daily  . calcitRIOL  0.25 mcg Oral Daily  . cyanocobalamin  1,000 mcg Intramuscular Q30 days  . enoxaparin (LOVENOX) injection  30 mg Subcutaneous Q24H  . furosemide  40 mg Oral Daily  . levofloxacin (LEVAQUIN) IV  750 mg Intravenous Q48H  . levothyroxine  75 mcg Oral QAC breakfast  . loratadine  10 mg Oral Daily  . pantoprazole  40 mg Oral Q1200  . potassium chloride SA  20 mEq Oral Daily  . sodium chloride  250 mL Intravenous Once  . sodium chloride  3 mL Intravenous Q12H  . sodium chloride  3 mL Intravenous Q12H  . triamcinolone cream   Topical BID  . DISCONTD: albuterol  2.5 mg Nebulization Q6H  . DISCONTD: enoxaparin (LOVENOX) injection  40 mg Subcutaneous Q24H  . DISCONTD: furosemide  40 mg Intravenous Daily    Assessment/Plan:  Principal Problem:  *CONGESTIVE HEART FAILURE Active Problems:  HYPOTHYROIDISM  HYPERTENSION  AORTIC STENOSIS  COPD  Community acquired pneumonia    Decompensated CHF with known history of severe aortic stenosis and systolic heart failure. Last EF was 40% in June 2012 echocardiogram. Patient stable and says she is better. Still short of breath. Will repeat CXR. Continue with IV lasix for today and will re-evaluate in AM.  IV . Volume status neg 3.9L since admission. Since patient has severe aortic stenosis careful not to over diurese. Patient is also wheezing which could be from CHF. Will continue albuterol nebulizers.   Possible pneumonia Chest x-ray shows possible infiltrates and patient is on Levaquin per pharmacy which we will continue. Afebrile, WC WNL. Will monitor.   Chronic lower extremity wounds Appreciate wound care consult. Has been on doxy recently and is pt at wound clinic. Dressing changed per wound RN. Right LE wound on medial and lateral aspect just above ankle with thick, greenish drainage large amounts. Foul  odor. Will continue Levaquin, day #3. If no improvement will consider broader coverage.   Right knee pain Could be from arthritis. Patient denies falls. Will get X-ray.  Hypothyroidism Continue Synthroid.   CODE STATUS - DO NOT RESUSCITATE as confirmed with patient's granddaughter Ms. Alice Cockrell who can be reached at [phone].   DVT Prophylaxis On Lovenox  PT Eval  Disposition Possible discharge in 1-2 days. Will follow up CXR and Knee xray.   LOS: 2 days   [facility]  [facility] Pager [phone] 10/05/2011, 8:36 AM

## 2011-10-05 NOTE — Consult Note (Signed)
Reason for ConsultPainful swollen right knee Referring Physician: Dr. Rito Ehrlich in the medical hospitalist  Theresa Hill is an 78 y.o. female.  HPI: 78 year old female with known arthritis of her right knee with long-term history of non-healing wounds of bilateral lower extremities on doxycycline several days prior to admission her consent concerns of infections in the lower extremities. Patient admitted for severe shortness of breath medically treated and improved patient reported to develop a painful swollen right knee. Patient able to bend her knee comfortably on admission but today with weightbearing and physical therapy significant pain. Patient denies any fevers chills or any other complaints other than the shortness of breath that brought her into the hospital  Past Medical History  Diagnosis Date  . HYPOTHYROIDISM 10/11/2006  . VITAMIN B12 DEFICIENCY 10/11/2006  . HYPONATREMIA 07/23/2007  . ANEMIA-NOS 09/14/2007  . HYPERTENSION 10/11/2006  . AORTIC STENOSIS 02/23/2007  . LEFT BUNDLE BRANCH BLOCK 10/11/2006  . CONGESTIVE HEART FAILURE 07/23/2007  . COPD 07/23/2007  . OSTEOPOROSIS 02/23/2007  . OSTEOPENIA 10/11/2006  . SAH (subarachnoid hemorrhage) 09/29/2010  . SDH (subdural hematoma) 09/29/2010  . GERD (gastroesophageal reflux disease) 09/29/2010  . DJD (degenerative joint disease) 09/29/2010    Past Surgical History  Procedure Date  . Tonsillectomy   . Abdominal hysterectomy   . Cataract surgery   . Appendectomy     Family History  Problem Relation Age of Onset  . Thyroid disease Mother   . Thyroid disease Maternal Grandmother   . Cancer Other     breast  . Diabetes Other     Social History:  reports that she has never smoked. She does not have any smokeless tobacco history on file. She reports that she does not drink alcohol or use illicit drugs.  Allergies:  Allergies  Allergen Reactions  . Cephalexin     REACTION: vomit  . Doxycycline Hyclate     REACTION: vomit  .  Sulfamethoxazole W-Trimethoprim     Medications: I have reviewed the patient's current medications.  Results for orders placed during the hospital encounter of 10/03/11 (from the past 48 hour(s))  BLOOD GAS, ARTERIAL     Status: Abnormal   Collection Time   10/03/11  8:45 PM      Component Value Range Comment   FIO2 1.00      Delivery systems BILEVEL POSITIVE AIRWAY PRESSURE      Inspiratory PAP 10      Expiratory PAP 5      pH, Arterial 7.443 (*) 7.350 - 7.400     pCO2 arterial 40.1  35.0 - 45.0 (mmHg)    pO2, Arterial 495.0 (*) 80.0 - 100.0 (mmHg)    Bicarbonate 27.0 (*) 20.0 - 24.0 (mEq/L)    TCO2 24.3  0 - 100 (mmol/L)    Acid-Base Excess 3.2 (*) 0.0 - 2.0 (mmol/L)    O2 Saturation 99.8      Patient temperature 98.6      Collection site LEFT RADIAL      Drawn by [id number]      Sample type ARTERIAL      Allens test (pass/fail) PASS  PASS    PRO B NATRIURETIC PEPTIDE     Status: Abnormal   Collection Time   10/03/11  8:54 PM      Component Value Range Comment   Pro B Natriuretic peptide (BNP) 8362.0 (*) 0 - 450 (pg/mL)   BASIC METABOLIC PANEL     Status: Abnormal   Collection Time  10/03/11  8:54 PM      Component Value Range Comment   Sodium 139  135 - 145 (mEq/L)    Potassium 4.4  3.5 - 5.1 (mEq/L)    Chloride 102  96 - 112 (mEq/L)    CO2 28  19 - 32 (mEq/L)    Glucose, Bld 106 (*) 70 - 99 (mg/dL)    BUN 23  6 - 23 (mg/dL)    Creatinine, Ser 1.61  0.50 - 1.10 (mg/dL)    Calcium 09.6  8.4 - 10.5 (mg/dL)    GFR calc non Af Amer 48 (*) >90 (mL/min)    GFR calc Af Amer 55 (*) >90 (mL/min)   CBC     Status: Abnormal   Collection Time   10/03/11  8:54 PM      Component Value Range Comment   WBC 10.4  4.0 - 10.5 (K/uL)    RBC 3.29 (*) 3.87 - 5.11 (MIL/uL)    Hemoglobin 10.8 (*) 12.0 - 15.0 (g/dL)    HCT 04.5 (*) 40.9 - 46.0 (%)    MCV 100.6 (*) 78.0 - 100.0 (fL)    MCH 32.8  26.0 - 34.0 (pg)    MCHC 32.6  30.0 - 36.0 (g/dL)    RDW 81.1 (*) 91.4 - 15.5 (%)    Platelets  299  150 - 400 (K/uL)   POCT I-STAT TROPONIN I     Status: Normal   Collection Time   10/03/11  9:02 PM      Component Value Range Comment   Troponin i, poc 0.03  0.00 - 0.08 (ng/mL)    Comment 3            CARDIAC PANEL(CRET KIN+CKTOT+MB+TROPI)     Status: Normal   Collection Time   10/04/11 12:01 AM      Component Value Range Comment   Total CK 49  7 - 177 (U/L)    CK, MB 3.1  0.3 - 4.0 (ng/mL)    Troponin I <0.30  <0.30 (ng/mL)    Relative Index RELATIVE INDEX IS INVALID  0.0 - 2.5    COMPREHENSIVE METABOLIC PANEL     Status: Abnormal   Collection Time   10/04/11  5:15 AM      Component Value Range Comment   Sodium 139  135 - 145 (mEq/L)    Potassium 3.4 (*) 3.5 - 5.1 (mEq/L)    Chloride 100  96 - 112 (mEq/L)    CO2 28  19 - 32 (mEq/L)    Glucose, Bld 82  70 - 99 (mg/dL)    BUN 21  6 - 23 (mg/dL)    Creatinine, Ser 7.82  0.50 - 1.10 (mg/dL)    Calcium 9.6  8.4 - 10.5 (mg/dL)    Total Protein 6.5  6.0 - 8.3 (g/dL)    Albumin 2.8 (*) 3.5 - 5.2 (g/dL)    AST 20  0 - 37 (U/L)    ALT 11  0 - 35 (U/L)    Alkaline Phosphatase 50  39 - 117 (U/L)    Total Bilirubin 0.6  0.3 - 1.2 (mg/dL)    GFR calc non Af Amer 46 (*) >90 (mL/min)    GFR calc Af Amer 53 (*) >90 (mL/min)   CBC     Status: Abnormal   Collection Time   10/04/11  5:15 AM      Component Value Range Comment   WBC 7.8  4.0 - 10.5 (K/uL)  RBC 2.94 (*) 3.87 - 5.11 (MIL/uL)    Hemoglobin 9.6 (*) 12.0 - 15.0 (g/dL)    HCT 69.6 (*) 29.5 - 46.0 (%)    MCV 99.7  78.0 - 100.0 (fL)    MCH 32.7  26.0 - 34.0 (pg)    MCHC 32.8  30.0 - 36.0 (g/dL)    RDW 28.4 (*) 13.2 - 15.5 (%)    Platelets 253  150 - 400 (K/uL)   CBC     Status: Abnormal   Collection Time   10/05/11  5:33 AM      Component Value Range Comment   WBC 10.7 (*) 4.0 - 10.5 (K/uL)    RBC 2.96 (*) 3.87 - 5.11 (MIL/uL)    Hemoglobin 9.6 (*) 12.0 - 15.0 (g/dL)    HCT 44.0 (*) 10.2 - 46.0 (%)    MCV 97.3  78.0 - 100.0 (fL)    MCH 32.4  26.0 - 34.0 (pg)    MCHC  33.3  30.0 - 36.0 (g/dL)    RDW 72.5 (*) 36.6 - 15.5 (%)    Platelets 249  150 - 400 (K/uL)   BASIC METABOLIC PANEL     Status: Abnormal   Collection Time   10/05/11  5:33 AM      Component Value Range Comment   Sodium 134 (*) 135 - 145 (mEq/L)    Potassium 3.9  3.5 - 5.1 (mEq/L)    Chloride 99  96 - 112 (mEq/L)    CO2 26  19 - 32 (mEq/L)    Glucose, Bld 104 (*) 70 - 99 (mg/dL)    BUN 19  6 - 23 (mg/dL)    Creatinine, Ser 4.40  0.50 - 1.10 (mg/dL)    Calcium 9.0  8.4 - 10.5 (mg/dL)    GFR calc non Af Amer 48 (*) >90 (mL/min)    GFR calc Af Amer 56 (*) >90 (mL/min)     Dg Knee 1-2 Views Right  10/05/2011  *RADIOLOGY REPORT*  Clinical Data: Right knee pain.  No known injury.  RIGHT KNEE - 1-2 VIEW  Comparison: None.  Findings: The patient has a large joint effusion.  No fracture is identified.  There is tricompartmental degenerative change. Atherosclerosis is noted.  IMPRESSION:  1.  Large joint effusion without evidence of fracture. 2.  Tricompartmental degenerative change.  Original Report Authenticated By: Bernadene Bell. Maricela Curet, M.D.   Dg Chest Port 1 View  10/05/2011  *RADIOLOGY REPORT*  Clinical Data: Shortness of breath, follow up pulmonary edema  PORTABLE CHEST - 1 VIEW  Comparison:  10/03/2011  Findings:   Cardiomediastinal silhouette is stable. Slight improvement in aeration.  Residual mild interstitial prominence without pulmonary edema.  Linear atelectasis or scarring right base.  No focal infiltrate.  Stable degenerative changes and levoscoliosis thoracic spine.  IMPRESSION:  Slight improvement in aeration.  Residual mild interstitial prominence without pulmonary edema.  Linear atelectasis or scarring right base.  No focal infiltrate.  Original Report Authenticated By: Natasha Mead, M.D.   Dg Chest Port 1 View  10/03/2011  *RADIOLOGY REPORT*  Clinical Data: Shortness of breath.  PORTABLE CHEST - 1 VIEW  Comparison: 04/01/2011  Findings: Kyphoscoliosis is present.  Heart size appears  stable. Left lung is probably clear.  There is minimal density at the right lung base, consistent with atelectasis or developing infiltrate. There may be mild interstitial edema when compared with multiple prior studies.  IMPRESSION:  1.  Suspect right lower lobe atelectasis or developing infiltrate.  2.  Probable interstitial pulmonary edema.  Original Report Authenticated By: Patterson Hammersmith, M.D.    Review of Systems  Constitutional: Negative for fever and chills.  HENT: Negative.   Respiratory: Positive for shortness of breath.   Cardiovascular: Negative.   Gastrointestinal: Negative.   Musculoskeletal: Positive for joint pain.       Right knee   Blood pressure 99/64, pulse 84, temperature 97.8 F (36.6 C), temperature source Oral, resp. rate 20, height 4' 11.84" (1.52 m), weight 51 kg (112 lb 7 oz), SpO2 99.00%. Physical Exam patient is an age appropriate thin frail female sitting up in a hospital bed comfortably in no apparent distress responds appropriately to questions. Does not appear systemically ill. Her right knee is without erythema or severe heat she does have a moderate effusion which is tender with palpation and attempts at motion of the knee. She has no gross instability about the knee. Her calf she does have Ace wrap dressings from the mid caps down to her feet is loose soft and nontender  Procedure after discussion with the patient and her family members present recommended aspiration of her rate knee the patient elected proceed. After double chlorhexidine prep sterile injection of lidocaine into the soft tissue and joint 18-gauge needle aspiration removed about 50 cc of a hazy yellowish fluid patient tolerated the aspiration while bandage was applied to the wound patient was able to bend her knee a little bit easier post aspiration but still had soreness. The fluid was sent off in a culture swab medium and immediate red top tube for a stat Gram stain culture cell count with  differential and crystals.  Assessment/Plan: Impression right knee pain with effusion with hazy synovial fluid rule out arthritic flare versus infection. Plan the culture medium and fluid was sent off to the lab for evaluation in a stat fashion. We'll monitor labs. Current patient is currently comfortable and stable is afebrile does not appear to be systemically ill. Patient is currently on Levaquin. Will continue this current treatment. If fluid comes back infectious in nature we'll recommend an arthroscopic washout of her knee. If it comes back arthritic flare would recommend a Cortizone injection. Patient was allowed to eat her meal this evening. Will follow labs and make decision on further treatment in the morning.  Jamelle Rushing 10/05/2011, 5:32 PM

## 2011-10-05 NOTE — Progress Notes (Signed)
Assumed care of pt at this time.  Granddaughter at bedside.  Pt and family members deny needs or concerns at this time.  Reviewed and agree with previous shift assessment.  Will continue to monitor.  Ardyth Gal, RN 10/05/2011

## 2011-10-05 NOTE — Evaluation (Signed)
Physical Therapy Evaluation Patient Details Name: Theresa Hill MRN: [id number] DOB: 1915-08-14 Today's Date: 10/05/2011 Time: 4098-1191 PT Time Calculation (min): 25 min  PT Assessment / Plan / Recommendation Clinical Impression  Pt admitted for CHF and pneumonia and currently presents with R knee pain/edema.  Pt would benefit from acute PT services in order to improve safety and independence with transfers and ambulation.  Daughter reports once knee pain is taken care of she should be able to assist pt at home (daughter reports they may aspirate knee).  Currently recommend ST-SNF unless daughter can provide mod assist.    PT Assessment  Patient needs continued PT services    Follow Up Recommendations  Skilled nursing facility    Barriers to Discharge        lEquipment Recommendations  Defer to next venue    Recommendations for Other Services     Frequency Min 3X/week    Precautions / Restrictions Precautions Precautions: Fall   Pertinent Vitals/Pain 5/10 knee pain with movement.  RN alerted.  LEs elevated.      Mobility  Bed Mobility Bed Mobility: Supine to Sit;Sit to Supine Supine to Sit: 5: Supervision;HOB elevated;With rails Sit to Supine: 3: Mod assist Details for Bed Mobility Assistance: increased time to perform supine to sit, modA for LEs onto bed due to pain Transfers Transfers: Stand to Sit;Sit to Stand Sit to Stand: 3: Mod assist;From elevated surface;With upper extremity assist Stand to Sit: 3: Mod assist;To elevated surface;With upper extremity assist Details for Transfer Assistance: assist 2* pt with R knee pain, verbal cues for hand placement, pt unable to tolerate standing more than 2 minutes, 2 attempts to stand due to pain in R knee Ambulation/Gait Ambulation/Gait Assistance: Not tested (comment)    Exercises     PT Diagnosis: Difficulty walking;Acute pain  PT Problem List: Decreased strength;Decreased activity tolerance;Decreased  mobility;Pain;Decreased knowledge of use of DME PT Treatment Interventions: DME instruction;Gait training;Functional mobility training;Therapeutic activities;Therapeutic exercise;Patient/family education   PT Goals Acute Rehab PT Goals PT Goal Formulation: With patient Time For Goal Achievement: 10/19/11 Potential to Achieve Goals: Good Pt will go Supine/Side to Sit: with supervision PT Goal: Supine/Side to Sit - Progress: Goal set today Pt will go Sit to Supine/Side: with supervision PT Goal: Sit to Supine/Side - Progress: Goal set today Pt will go Sit to Stand: with supervision PT Goal: Sit to Stand - Progress: Goal set today Pt will go Stand to Sit: with supervision PT Goal: Stand to Sit - Progress: Goal set today Pt will Ambulate: 51 - 150 feet;with supervision;with least restrictive assistive device PT Goal: Ambulate - Progress: Goal set today  Visit Information  Last PT Received On: 10/05/11 Assistance Needed: +2    Subjective Data  Subjective: "It hurts when I move it."  (knee pain with mobility)   Prior Functioning  Home Living Lives With: Daughter Type of Home: House Home Access: Stairs to enter Secretary/administrator of Steps: 1 Entrance Stairs-Rails: None Home Layout: One level Home Adaptive Equipment: Straight cane Prior Function Level of Independence: Independent with assistive device(s) Communication Communication: No difficulties    Cognition  Overall Cognitive Status: Appears within functional limits for tasks assessed/performed Arousal/Alertness: Awake/alert Orientation Level: Appears intact for tasks assessed Behavior During Session: [facility] for tasks performed    Extremity/Trunk Assessment Right Lower Extremity Assessment RLE ROM/Strength/Tone: Deficits;Due to pain RLE ROM/Strength/Tone Deficits: able to move against gravity so at least 3/5 throughout, pain with knee flexion/extension, R knee with swelling Left Lower  Extremity Assessment LLE  ROM/Strength/Tone: [facility] for tasks assessed   Balance    End of Session PT - End of Session Activity Tolerance: Patient limited by pain Patient left: in bed;with family/visitor present;with call bell/phone within reach Nurse Communication: Patient requests pain meds   Rosemond Lyttle,KATHrine E 10/05/2011, 4:14 PM Pager: [phone]

## 2011-10-06 DIAGNOSIS — I509 Heart failure, unspecified: Secondary | ICD-10-CM

## 2011-10-06 DIAGNOSIS — D696 Thrombocytopenia, unspecified: Secondary | ICD-10-CM

## 2011-10-06 DIAGNOSIS — I359 Nonrheumatic aortic valve disorder, unspecified: Secondary | ICD-10-CM

## 2011-10-06 DIAGNOSIS — M25469 Effusion, unspecified knee: Secondary | ICD-10-CM

## 2011-10-06 LAB — CBC
HCT: 28.4 % — ABNORMAL LOW (ref 36.0–46.0)
Hemoglobin: 9.5 g/dL — ABNORMAL LOW (ref 12.0–15.0)
MCV: 97.9 fL (ref 78.0–100.0)
RDW: 16.7 % — ABNORMAL HIGH (ref 11.5–15.5)
WBC: 8.8 10*3/uL (ref 4.0–10.5)

## 2011-10-06 LAB — BASIC METABOLIC PANEL
BUN: 21 mg/dL (ref 6–23)
Chloride: 96 mEq/L (ref 96–112)
Creatinine, Ser: 1.04 mg/dL (ref 0.50–1.10)
GFR calc Af Amer: 52 mL/min — ABNORMAL LOW (ref >90)
Glucose, Bld: 82 mg/dL (ref 70–99)
Potassium: 3.4 mEq/L — ABNORMAL LOW (ref 3.5–5.1)

## 2011-10-06 MED ORDER — POTASSIUM CHLORIDE CRYS ER 20 MEQ PO TBCR
20.0000 meq | EXTENDED_RELEASE_TABLET | Freq: Once | ORAL | Status: AC
  Administered 2011-10-06: 20 meq via ORAL
  Filled 2011-10-06: qty 1

## 2011-10-06 MED ORDER — LEVOFLOXACIN 750 MG PO TABS
750.0000 mg | ORAL_TABLET | ORAL | Status: DC
  Filled 2011-10-06: qty 1

## 2011-10-06 MED ORDER — PREDNISONE 20 MG PO TABS
20.0000 mg | ORAL_TABLET | Freq: Two times a day (BID) | ORAL | Status: DC
  Administered 2011-10-06 – 2011-10-07 (×3): 20 mg via ORAL
  Filled 2011-10-06 (×6): qty 1

## 2011-10-06 MED ORDER — COLCHICINE 0.6 MG PO TABS
0.3000 mg | ORAL_TABLET | Freq: Two times a day (BID) | ORAL | Status: DC
  Administered 2011-10-06: 0.3 mg via ORAL
  Filled 2011-10-06 (×2): qty 0.5

## 2011-10-06 MED ORDER — FUROSEMIDE 10 MG/ML IJ SOLN
40.0000 mg | Freq: Every day | INTRAMUSCULAR | Status: DC
  Administered 2011-10-06: 40 mg via INTRAVENOUS
  Filled 2011-10-06 (×2): qty 4

## 2011-10-06 MED ORDER — COLCHICINE 0.6 MG PO TABS
0.3000 mg | ORAL_TABLET | Freq: Two times a day (BID) | ORAL | Status: DC
  Filled 2011-10-06 (×2): qty 0.5

## 2011-10-06 NOTE — Progress Notes (Signed)
Speech Language Pathology Dysphagia Treatment Patient Details Name: Theresa Hill MRN: [id number] DOB: 04-27-1916 Today's Date: 10/06/2011 Time: 1540-1550 SLP Time Calculation (min): 10 min  Assessment / Plan / Recommendation Clinical Impression  Pt observed drinking thin water via straw *3 boluses without clinical s/s of aspiration nor excessive incr work of breathing.  Exhalation after swallow noted, which maximizes her airway protection.  Pt reports good intake today and denies any coughing.  Again reviewed esophagram results from 09 with family where pt had aspiration and laryngeal penetration -  Advised pt to consume water with meals for better lung absorption if she aspirates.  Question if hoarseness could be secondary to chronic aspiration.  Again kyphosis and GERD likely contribute to asp risk.  Further educated pt and family that dysphagia is one risk factor for pna, but not the only one and she is at risk of episodically aspirating due to respiratory condition.  Family and pt verbalized understanding to information.  No further SLP indicated, please re-consult if desire.  Thanks for the consult.     Diet Recommendation  Continue with Current Diet: Regular;Thin liquid    SLP Plan All goals met   Pertinent Vitals/Pain Afebrile   Swallowing Goals  SLP Swallowing Goals Swallow Study Goal #2 - Progress: Met  General Temperature Spikes Noted: No Respiratory Status: Supplemental O2 delivered via (comment) Behavior/Cognition: Alert Oral Cavity - Dentition: Dentures, bottom;Dentures, top Patient Positioning: Postural control interferes with function  Oral Cavity - Oral Hygiene   Advised to oral care importance.   Dysphagia Treatment   Goals met, education completed.  Chales Abrahams 10/06/2011, 4:03 PM

## 2011-10-06 NOTE — Progress Notes (Signed)
ANTIBIOTIC CONSULT NOTE - FOLLOW UP  Pharmacy Consult for Levaquin Indication: PNA, leg wounds  Allergies  Allergen Reactions  . Cephalexin     REACTION: vomit  . Doxycycline Hyclate     REACTION: vomit  . Sulfamethoxazole W-Trimethoprim     Patient Measurements: Height: 4' 11.84" (152 cm) Weight: 112 lb (50.803 kg) IBW/kg (Calculated) : 45.14   Vital Signs: Temp: 98.5 F (36.9 C) (05/23 0556) Temp src: Oral (05/23 0556) BP: 97/60 mmHg (05/23 0556) Pulse Rate: 70  (05/23 0556) Intake/Output from previous day: 05/22 0701 - 05/23 0700 In: 630 [P.O.:480; IV Piggyback:150] Out: 2450 [Urine:2450] Intake/Output from this shift:    Labs:  Basename 10/06/11 0515 10/05/11 0533 10/04/11 0515  WBC 8.8 10.7* 7.8  HGB 9.5* 9.6* 9.6*  PLT 244 249 253  LABCREA -- -- --  CREATININE 1.04 0.98 1.02   Estimated Creatinine Clearance: 24.1 ml/min (by C-G formula based on Cr of 1.04).   Microbiology: Recent Results (from the past 720 hour(s))  BODY FLUID CULTURE     Status: Normal (Preliminary result)   Collection Time   10/05/11  5:57 PM      Component Value Range Status Comment   Specimen Description SYNOVIAL   Final    Special Requests Normal   Final    Gram Stain     Final    Value: ABUNDANT WBC NO ORGANISMS SEEN     Performed by [facility] Gram Stain Report Called to,Read Back By and Verified With: Gram Stain Report Called to,Read Back By and Verified With:  Oneida Alar PA 1906 10/05/11 MURPHYD   Culture PENDING   Incomplete    Report Status PENDING   Incomplete   GRAM STAIN     Status: Normal   Collection Time   10/05/11  5:57 PM      Component Value Range Status Comment   Specimen Description SYNOVIAL   Final    Special Requests Normal   Final    Gram Stain     Final    Value: ABUNDANT WBC SEEN     NO ORGANISMS SEEN     Gram Stain Report Called to,Read Back By and Verified With: BOB KEEN PA/[id number]/MURPHYD   Report Status 10/05/2011 FINAL   Final      Anti-infectives     Start     Dose/Rate Route Frequency Ordered Stop   10/05/11 2200   levofloxacin (LEVAQUIN) IVPB 750 mg        750 mg 100 mL/hr over 90 Minutes Intravenous Every 48 hours 10/03/11 2355     10/03/11 2230   levofloxacin (LEVAQUIN) IVPB 750 mg        750 mg 100 mL/hr over 90 Minutes Intravenous  Once 10/03/11 2216 10/04/11 0003          Assessment:  Day #4 Levaquin 750mg  IV q48h for presumed PNA and LE wounds  CrCl ~24ml/min despite normal Scr due to age  Goal of Therapy:  Eradication of infection  Plan:   Continue current dosage, change to po  Continue to follow renal function  Loralee Pacas, PharmD, BCPS Pager: [phone] 10/06/2011,9:28 AM

## 2011-10-06 NOTE — Progress Notes (Signed)
Clinical Social Work Department CLINICAL SOCIAL WORK PLACEMENT NOTE 10/06/2011  Patient:  Theresa Hill, Theresa Hill  Account Number:  [id number] Admit date:  10/03/2011  Clinical Social Worker:  Orpah Greek  Date/time:  10/06/2011 04:32 PM  Clinical Social Work is seeking post-discharge placement for this patient at the following level of care:   SKILLED NURSING   (*CSW will update this form in Epic as items are completed)   10/06/2011  Patient/family provided with [facility] Department of Clinical Social Work's list of facilities offering this level of care within the geographic area requested by the patient (or if unable, by the patient's family).  10/06/2011  Patient/family informed of their freedom to choose among providers that offer the needed level of care, that participate in Medicare, Medicaid or managed care program needed by the patient, have an available bed and are willing to accept the patient.  10/06/2011  Patient/family informed of [facility]' ownership interest in [facility], as well as of the fact that they are under no obligation to receive care at this facility.  PASARR submitted to EDS on 10/06/2011 PASARR number received from EDS on 10/06/2011  FL2 transmitted to all facilities in geographic area requested by pt/family on  10/06/2011 FL2 transmitted to all facilities within larger geographic area on   Patient informed that his/her managed care company has contracts with or will negotiate with  certain facilities, including the following:     Patient/family informed of bed offers received:  10/06/2011 Patient chooses bed at  Physician recommends and patient chooses bed at    Patient to be transferred to  on   Patient to be transferred to facility by   The following physician request were entered in Epic:   Additional Comments:    Unice Bailey, LCSWA [phone]

## 2011-10-06 NOTE — Progress Notes (Signed)
Clinical Social Work Department BRIEF PSYCHOSOCIAL ASSESSMENT 10/06/2011  Patient:  Theresa Hill, Theresa Hill     Account Number:  [id number]     Admit date:  10/03/2011  Clinical Social Worker:  Orpah Greek  Date/Time:  10/06/2011 04:22 PM  Referred by:  Physician  Date Referred:  10/06/2011 Referred for  SNF Placement   Other Referral:   Interview type:  Patient Other interview type:    PSYCHOSOCIAL DATA Living Status:  FAMILY Admitted from facility:   Level of care:   Primary support name:  Nathen May (dtr) h: [phone] w: [phone] c: [phone] Primary support relationship to patient:  CHILD, ADULT Degree of support available:   good    CURRENT CONCERNS Current Concerns  Post-Acute Placement   Other Concerns:    SOCIAL WORK ASSESSMENT / PLAN CSW spoke with patient's daughter, Fulton Mole at bedside re: discharge planning. PT recommended ST SNF for rehab. Fulton Mole stated that patient had been to [facility] in the past but would like to see what other facilities they could look at.   Assessment/plan status:  Information/Referral to [facility] Other assessment/ plan:   Information/referral to community resources:   CSW completed FL2 and faxed information out to [facility] and will provided what bed offers we have so far.    PATIENT'S/FAMILY'S RESPONSE TO PLAN OF CARE: Patient's daughter was open to SNF placement for rehab, but does not want her to return to [facility] where she had been in the past.        Unice Bailey, [employer] [phone]

## 2011-10-06 NOTE — Progress Notes (Signed)
PCP: Oliver Barre, MD, MD  Brief HPI: 78 year old female with known history of severe aortic stenosis and systolic heart failure with last EF measured in June 2012 was 40% and as per cardiology not a candidate for valve replacement started developing sudden onset of shortness of breath evening before admission and was brought to the ER. Chest x-ray revealed pulmonary edema pattern and also possible infiltrates. Patient was initially placed on BiPAP and after Lasix patient was off the BiPAP and then admitted for decompensated CHF with possible pneumonia. Patient denied any chest pain nausea vomiting or abdominal pain. Patient has been having chronic lower extremity wounds for which she is being followed at wound center and was recently placed on antibiotics.   Past medical history:  Past Medical History  Diagnosis Date  . HYPOTHYROIDISM 10/11/2006  . VITAMIN B12 DEFICIENCY 10/11/2006  . HYPONATREMIA 07/23/2007  . ANEMIA-NOS 09/14/2007  . HYPERTENSION 10/11/2006  . AORTIC STENOSIS 02/23/2007  . LEFT BUNDLE BRANCH BLOCK 10/11/2006  . CONGESTIVE HEART FAILURE 07/23/2007  . COPD 07/23/2007  . OSTEOPOROSIS 02/23/2007  . OSTEOPENIA 10/11/2006  . SAH (subarachnoid hemorrhage) 09/29/2010  . SDH (subdural hematoma) 09/29/2010  . GERD (gastroesophageal reflux disease) 09/29/2010  . DJD (degenerative joint disease) 09/29/2010    Consultants: Dr. Thomasena Edis (Ortho)  Procedures: None  Subjective: Patient says she is feeling some better. Right knee is better. Still some short of breath. Denies any CP. Says she walks around in the house.  Objective: Vital signs in last 24 hours: Temp:  [97.8 F (36.6 C)-98.8 F (37.1 C)] 98.5 F (36.9 C) (05/23 0556) Pulse Rate:  [70-84] 70  (05/23 0556) Resp:  [20] 20  (05/23 0556) BP: (95-99)/(58-64) 97/60 mmHg (05/23 0556) SpO2:  [97 %-99 %] 98 % (05/23 0556) Weight:  [50.803 kg (112 lb)] 50.803 kg (112 lb) (05/23 0700) Weight change: -0.197 kg (-7 oz) Last BM Date:  10/03/11  Intake/Output from previous day: 05/22 0701 - 05/23 0700 In: 630 [P.O.:480; IV Piggyback:150] Out: 2450 [Urine:2450] Intake/Output this shift:    General appearance: alert, cooperative, appears stated age and no distress Head: Normocephalic, without obvious abnormality, atraumatic Back: scoliosis is noted Resp: Decreased air entry at the bases with crackles. No wheezing. Some improvement from yesterday. Cardio: regular rate and rhythm, S1, S2 normal. Systolic murmur over precordium. No click, rub or gallop GI: soft, non-tender; bowel sounds normal; no masses,  no organomegaly Extremities: Knee swelling is less. Tender to palpation over the patella. No warmth or erythema. Improved ROM. Pulses: 2+ and symmetric Skin: Skin color, texture, turgor normal. No rashes or lesions Lymph nodes: Cervical, supraclavicular, and axillary nodes normal. Neurologic: Grossly normal  Lab Results:  Basename 10/06/11 0515 10/05/11 0533  WBC 8.8 10.7*  HGB 9.5* 9.6*  HCT 28.4* 28.8*  PLT 244 249   BMET  Basename 10/06/11 0515 10/05/11 0533 10/04/11 0515  NA 133* 134* --  K 3.4* 3.9 --  CL 96 99 --  CO2 28 26 --  GLUCOSE 82 104* --  BUN 21 19 --  CREATININE 1.04 0.98 --  CALCIUM 8.5 9.0 --  ALT -- -- 11    Studies/Results: Dg Knee 1-2 Views Right  10/05/2011  *RADIOLOGY REPORT*  Clinical Data: Right knee pain.  No known injury.  RIGHT KNEE - 1-2 VIEW  Comparison: None.  Findings: The patient has a large joint effusion.  No fracture is identified.  There is tricompartmental degenerative change. Atherosclerosis is noted.  IMPRESSION:  1.  Large  joint effusion without evidence of fracture. 2.  Tricompartmental degenerative change.  Original Report Authenticated By: Bernadene Bell. Maricela Curet, M.D.   Dg Chest Port 1 View  10/05/2011  *RADIOLOGY REPORT*  Clinical Data: Shortness of breath, follow up pulmonary edema  PORTABLE CHEST - 1 VIEW  Comparison:  10/03/2011  Findings:   Cardiomediastinal  silhouette is stable. Slight improvement in aeration.  Residual mild interstitial prominence without pulmonary edema.  Linear atelectasis or scarring right base.  No focal infiltrate.  Stable degenerative changes and levoscoliosis thoracic spine.  IMPRESSION:  Slight improvement in aeration.  Residual mild interstitial prominence without pulmonary edema.  Linear atelectasis or scarring right base.  No focal infiltrate.  Original Report Authenticated By: Natasha Mead, M.D.    Medications:  Scheduled:    . aspirin EC  81 mg Oral Daily  . calcitRIOL  0.25 mcg Oral Daily  . colchicine  0.6 mg Oral BID  . cyanocobalamin  1,000 mcg Intramuscular Q30 days  . enoxaparin (LOVENOX) injection  30 mg Subcutaneous Q24H  . furosemide  40 mg Intravenous Q12H  . levofloxacin (LEVAQUIN) IV  750 mg Intravenous Q48H  . levothyroxine  75 mcg Oral QAC breakfast  . loratadine  10 mg Oral Daily  . pantoprazole  40 mg Oral Q1200  . potassium chloride SA  20 mEq Oral Daily  . sodium chloride  3 mL Intravenous Q12H  . sodium chloride  3 mL Intravenous Q12H  . triamcinolone cream   Topical BID  . DISCONTD: furosemide  40 mg Oral Daily    Assessment/Plan:  Principal Problem:  *CONGESTIVE HEART FAILURE Active Problems:  HYPOTHYROIDISM  HYPERTENSION  AORTIC STENOSIS  COPD  Community acquired pneumonia    Decompensated Systolic CHF with known history of severe aortic stenosis and systolic heart failure. Last EF was 40% in June 2012 echocardiogram. Patient stable and says she is better. Still short of breath. Will repeat CXR. Decrease IV lasix for today and will re-evaluate in AM. Volume status neg 5.7L since admission. Since patient has severe aortic stenosis careful not to over diurese. Patient is also wheezing which could be from CHF. Will continue albuterol nebulizers.   Possible pneumonia Chest x-ray shows possible infiltrates and patient is on Levaquin per pharmacy which we will continue. Afebrile, WC  WNL. Will monitor.   Chronic lower extremity wounds Appreciate wound care consult. Has been on doxy recently and is pt at wound clinic. Dressing changed per wound RN. Right LE wound on medial and lateral aspect just above ankle with thick, greenish drainage large amounts. Foul odor. Will continue Levaquin, day #3. Can continue with wound care at home. And follow up with PCP.  Right knee pain with effusion Secondary to Acute Gout Appreciate Ortho input. S/P arthrocentesis. Synovial fluid was found to have MSU crystals. On colchicine. Will add steroids.   Hypothyroidism Continue Synthroid.   CODE STATUS - DO NOT RESUSCITATE as confirmed with patient's granddaughter Ms. Alice Cockrell who can be reached at [phone].   DVT Prophylaxis On Lovenox  PT Eval SNF was recommended but daughter would like to take her home. Will discuss with her today.  Disposition Possible discharge in 1-2 days.    LOS: 3 days   [facility]  [facility] Pager [phone] 10/06/2011, 8:11 AM

## 2011-10-06 NOTE — Progress Notes (Signed)
CSW received referral from MD for possible SNF placement. Noted PT recommending SNF at discharge, though per MD - patient's daughter would like to take patient home & MD feels patient should be ok to d/c home with daughter by the time of discharge. CSW completed FL2 and had MD sign incase d/c plan should change.   Unice Bailey, LCSWA [phone]

## 2011-10-06 NOTE — Progress Notes (Signed)
Physical Therapy Treatment Patient Details Name: MADELYNNE LASKER MRN: [id number] DOB: 11-Sep-1915 Today's Date: 10/06/2011 Time: 4098-1191 PT Time Calculation (min): 12 min  PT Assessment / Plan / Recommendation Comments on Treatment Session  Pt reports her knee feels much better today however her ankles are sore (chronic wounds).  Pt able to ambulate 30 feet with min assist (+2 for safety).  Family agreeable to ST-SNF upon D/C as pt still requiring assist and daughter reports she will not be around 24/7.    Follow Up Recommendations  Skilled nursing facility    Barriers to Discharge        Equipment Recommendations  Defer to next venue    Recommendations for Other Services    Frequency     Plan Discharge plan remains appropriate;Frequency remains appropriate    Precautions / Restrictions Precautions Precautions: Fall   Pertinent Vitals/Pain Pt reports soreness mostly in bilateral ankles and a little in R knee.  LEs elevated.    Mobility  Bed Mobility Supine to Sit: 5: Supervision;HOB elevated;With rails Details for Bed Mobility Assistance: increased time and verbal cues Transfers Transfers: Stand to Sit;Sit to Stand Sit to Stand: 4: Min assist;With upper extremity assist;From bed;From elevated surface Stand to Sit: 4: Min assist;With armrests;To chair/3-in-1 Details for Transfer Assistance: verbal cues for hand placement and safe technique, pt reports soreness in ankles mostly the problem today Ambulation/Gait Ambulation/Gait Assistance: 4: Min assist Ambulation Distance (Feet): 30 Feet Assistive device: Rolling walker Ambulation/Gait Assistance Details: +2 for safety, verbal cues for technique, pt reports soreness in ankles more limiting factor today than R knee Gait Pattern: Step-to pattern;Narrow base of support;Trunk flexed Gait velocity: decreased    Exercises     PT Diagnosis:    PT Problem List:   PT Treatment Interventions:     PT Goals Acute Rehab PT  Goals PT Goal: Supine/Side to Sit - Progress: Partly met PT Goal: Sit to Stand - Progress: Progressing toward goal PT Goal: Stand to Sit - Progress: Progressing toward goal PT Goal: Ambulate - Progress: Progressing toward goal  Visit Information  Last PT Received On: 10/06/11 Assistance Needed: +2    Subjective Data  Subjective: "It feels much better today." (R knee)   Cognition  Overall Cognitive Status: Appears within functional limits for tasks assessed/performed Arousal/Alertness: Awake/alert Orientation Level: Appears intact for tasks assessed Behavior During Session: [facility] for tasks performed    Balance     End of Session PT - End of Session Activity Tolerance: Patient limited by fatigue;Patient limited by pain Patient left: in chair;with call bell/phone within reach;with family/visitor present    Sruthi Maurer,KATHrine E 10/06/2011, 4:32 PM Pager: [phone]

## 2011-10-07 ENCOUNTER — Other Ambulatory Visit: Payer: Medicare Other

## 2011-10-07 DIAGNOSIS — E032 Hypothyroidism due to medicaments and other exogenous substances: Secondary | ICD-10-CM

## 2011-10-07 DIAGNOSIS — I359 Nonrheumatic aortic valve disorder, unspecified: Secondary | ICD-10-CM

## 2011-10-07 DIAGNOSIS — I509 Heart failure, unspecified: Secondary | ICD-10-CM

## 2011-10-07 DIAGNOSIS — M109 Gout, unspecified: Secondary | ICD-10-CM

## 2011-10-07 LAB — BASIC METABOLIC PANEL
Calcium: 8.9 mg/dL (ref 8.4–10.5)
Creatinine, Ser: 1.06 mg/dL (ref 0.50–1.10)
GFR calc Af Amer: 51 mL/min — ABNORMAL LOW (ref >90)
GFR calc non Af Amer: 44 mL/min — ABNORMAL LOW (ref >90)
Sodium: 133 mEq/L — ABNORMAL LOW (ref 135–145)

## 2011-10-07 MED ORDER — LEVOFLOXACIN 750 MG PO TABS: 750.0000 mg | ORAL_TABLET | ORAL | Status: AC

## 2011-10-07 MED ORDER — COLCHICINE 0.6 MG PO TABS
0.3000 mg | ORAL_TABLET | Freq: Every day | ORAL | Status: DC
  Administered 2011-10-07: 0.3 mg via ORAL
  Filled 2011-10-07: qty 0.5

## 2011-10-07 MED ORDER — COLCHICINE 0.6 MG PO TABS: 0.3000 mg | ORAL_TABLET | Freq: Every day | ORAL | Status: DC

## 2011-10-07 MED ORDER — ACETAMINOPHEN 325 MG PO TABS: 650.0000 mg | ORAL_TABLET | Freq: Four times a day (QID) | ORAL | Status: AC | PRN

## 2011-10-07 MED ORDER — FUROSEMIDE 40 MG PO TABS
60.0000 mg | ORAL_TABLET | Freq: Every day | ORAL | Status: DC
  Administered 2011-10-07: 60 mg via ORAL
  Filled 2011-10-07: qty 1

## 2011-10-07 MED ORDER — ALBUTEROL SULFATE (5 MG/ML) 0.5% IN NEBU: 2.5000 mg | INHALATION_SOLUTION | RESPIRATORY_TRACT | Status: DC | PRN

## 2011-10-07 MED ORDER — FUROSEMIDE 20 MG PO TABS: 60.0000 mg | ORAL_TABLET | Freq: Every day | ORAL | Status: DC

## 2011-10-07 MED ORDER — TRAMADOL-ACETAMINOPHEN 37.5-325 MG PO TABS: 1.0000 | ORAL_TABLET | Freq: Four times a day (QID) | ORAL | Status: DC | PRN

## 2011-10-07 MED ORDER — PREDNISONE 20 MG PO TABS: ORAL_TABLET | ORAL | Status: DC

## 2011-10-07 NOTE — Discharge Summary (Signed)
Physician Discharge Summary  Patient ID: Theresa Hill MRN: [id number] DOB/AGE: 1915/07/05 78 y.o.  Admit date: 10/03/2011 Discharge date: 10/07/2011  PCP: Oliver Barre, MD, MD  Discharge Diagnoses:  Principal Problem:  *CONGESTIVE HEART FAILURE Active Problems:  HYPOTHYROIDISM  HYPERTENSION  AORTIC STENOSIS  COPD  Community acquired pneumonia   Discharged Condition: fair  Initial History: 78 year old female with known history of severe aortic stenosis and systolic heart failure with last EF measured in June 2012 was 40% and as per cardiology not a candidate for valve replacement started developing sudden onset of shortness of breath evening before admission and was brought to the ER. Chest x-ray revealed pulmonary edema pattern and also possible infiltrates. Patient was initially placed on BiPAP and after Lasix patient was off the BiPAP and then admitted for decompensated CHF with possible pneumonia. Patient denied any chest pain nausea vomiting or abdominal pain. Patient has been having chronic lower extremity wounds for which she is being followed at wound center and was recently placed on antibiotics.   Hospital Course:   Decompensated Systolic CHF with known history of severe aortic stenosis and systolic heart failure.  Last EF was 40% in June 2012 echocardiogram. Patient stable and continues to feel better. Repeat chest x-ray showed improvement in pulmonary edema. Dose of Lasix has been changed to her oral home dose. Patient is diuresed quite well and is negative by about 6.4 L this admission. Because of her borderline blood pressure and severe aortic stenosis patient is not thought to be a candidate for ACE inhibitors.    Possible pneumonia  Chest x-ray shows possible infiltrates and patient was started on Levaquin. She will get 2 more doses at the skilled nursing facility.  Right knee pain with effusion Secondary to Acute Gout  Patient was found to have swelling of her right  knee. It was very tender to palpation and had decreased range of motion. Orthopedics was consulted and Dr. Thomasena Edis evaluated the patient. Arthrocentesis was performed and the synovial fluid was consistent with the gout. Patient was started on colchicine as well as steroids. Her pain is significantly improved. Her range of motion is improved as well. She will need to followup with her PCP for definitive management of gout. She'll be sent home on steroid taper, as well as 2 more weeks of colchicine.   Chronic lower extremity wounds  Patient was seen by wound care team. Dressing changes were done. This will need to be addressed at the nursing facility. Further management per her primary care physician.   Hypothyroidism  Synthroid was continued.    CODE STATUS - she is a DO NOT RESUSCITATE.  Sedation has shown continued improvement. Her breathing is better. She is able to move her right knee better. Overall, patient is improved and is stable for discharge. She was seen by physical therapy and is thought to require skilled nursing facility for short-term rehabilitation. Patient is reluctant arm but is agreeable. Daughter is agreeable as well. So, the plan will be for discharge to skilled nursing facility later today.    PERTINENT LABS Her proBNP was 8300. Hemoglobin 10.8. Hemoglobin on discharge is 9.5. Renal function remained stable. Gram stain from synovial fluid. Does not show any organisms. Cultures are all pending and Ortho will followup the same.   IMAGING STUDIES Dg Knee 1-2 Views Right  29-Oct-2011  *RADIOLOGY REPORT*  Clinical Data: Right knee pain.  No known injury.  RIGHT KNEE - 1-2 VIEW  Comparison: None.  Findings: The  patient has a large joint effusion.  No fracture is identified.  There is tricompartmental degenerative change. Atherosclerosis is noted.  IMPRESSION:  1.  Large joint effusion without evidence of fracture. 2.  Tricompartmental degenerative change.  Original Report  Authenticated By: Bernadene Bell. Maricela Curet, M.D.   Dg Chest Port 1 View  10/05/2011  *RADIOLOGY REPORT*  Clinical Data: Shortness of breath, follow up pulmonary edema  PORTABLE CHEST - 1 VIEW  Comparison:  10/03/2011  Findings:   Cardiomediastinal silhouette is stable. Slight improvement in aeration.  Residual mild interstitial prominence without pulmonary edema.  Linear atelectasis or scarring right base.  No focal infiltrate.  Stable degenerative changes and levoscoliosis thoracic spine.  IMPRESSION:  Slight improvement in aeration.  Residual mild interstitial prominence without pulmonary edema.  Linear atelectasis or scarring right base.  No focal infiltrate.  Original Report Authenticated By: Natasha Mead, M.D.   Dg Chest Port 1 View  10/03/2011  *RADIOLOGY REPORT*  Clinical Data: Shortness of breath.  PORTABLE CHEST - 1 VIEW  Comparison: 04/01/2011  Findings: Kyphoscoliosis is present.  Heart size appears stable. Left lung is probably clear.  There is minimal density at the right lung base, consistent with atelectasis or developing infiltrate. There may be mild interstitial edema when compared with multiple prior studies.  IMPRESSION:  1.  Suspect right lower lobe atelectasis or developing infiltrate. 2.  Probable interstitial pulmonary edema.  Original Report Authenticated By: Patterson Hammersmith, M.D.    Discharge Exam: Blood pressure 111/63, pulse 65, temperature 97.4 F (36.3 C), temperature source Oral, resp. rate 18, height 4' 11.84" (1.52 m), weight 49 kg (108 lb 0.4 oz), SpO2 96.00%. General appearance: alert, cooperative, appears stated age and no distress Head: Normocephalic, without obvious abnormality, atraumatic Back: Kyphoscoliosis is present Resp: Few crackles at the bases, but much improved air entry. No wheezing is present Cardio: regular rate and rhythm, S1, S2 normal, systolic murmur is present over the precordium, click, rub or gallop GI: soft, non-tender; bowel sounds normal; no  masses,  no organomegaly Extremities: Improved swelling of the right knee. Improved range of motion. Skin: Chronic lower extremity wounds covered in dressing. Neurologic: Grossly normal  Disposition: to SNF  Discharge Orders    Future Appointments: Provider: Department: Dept Phone: Center:   11/04/2011 1:30 PM [facility] [phone] [facility]     Future Orders Please Complete By Expires   Diet - low sodium heart healthy      Increase activity slowly        Current Discharge Medication List    START taking these medications   Details  acetaminophen (TYLENOL) 325 MG tablet Take 2 tablets (650 mg total) by mouth every 6 (six) hours as needed (or Fever >/= 101).    albuterol (PROVENTIL) (5 MG/ML) 0.5% nebulizer solution Take 0.5 mLs (2.5 mg total) by nebulization every 4 (four) hours as needed for wheezing. Qty: 20 mL    colchicine 0.6 MG tablet Take 0.5 tablets (0.3 mg total) by mouth daily. For 2 weeks and then stop    levofloxacin (LEVAQUIN) 750 MG tablet Take 1 tablet (750 mg total) by mouth every other day. For 2 more doses and then stop    predniSONE (DELTASONE) 20 MG tablet 1 tablet twice daily for 4 days, then 1 tab once daily for 5 days and then STOP      CONTINUE these medications which have CHANGED   Details  furosemide (LASIX) 20 MG tablet Take 3 tablets (60  mg total) by mouth daily. Qty: 30 tablet    traMADol-acetaminophen (ULTRACET) 37.5-325 MG per tablet Take 1 tablet by mouth every 6 (six) hours as needed for pain. Qty: 30 tablet, Refills: 0      CONTINUE these medications which have NOT CHANGED   Details  alendronate (FOSAMAX) 70 MG tablet TAKE 1 TABLET BY MOUTH EVERY WEEK Qty: 4 tablet, Refills: 3    Aspirin (ADULT ASPIRIN LOW STRENGTH) 81 MG EC tablet Take 81 mg by mouth daily.      Bioflavonoid Products (ESTER C PO) Take 1,000 mg by mouth daily.      calcitRIOL (ROCALTROL) 0.25 MCG capsule take 1 capsule by mouth once  daily Qty: 30 capsule, Refills: 11    calcium carbonate (OS-CAL) 600 MG TABS Take 600 mg by mouth daily.     fexofenadine (ALLEGRA) 180 MG tablet Take 180 mg by mouth daily.      levothyroxine (SYNTHROID, LEVOTHROID) 50 MCG tablet Take 50 mcg by mouth daily.    Multiple Vitamin (MULITIVITAMIN WITH MINERALS) TABS Take 1 tablet by mouth daily.    omeprazole (PRILOSEC) 20 MG capsule take 1 capsule by mouth once daily Qty: 90 capsule, Refills: 3    potassium chloride SA (K-DUR,KLOR-CON) 20 MEQ tablet Take 10 mEq by mouth daily.    triamcinolone (KENALOG) 0.1 % cream Apply topically 2 (two) times daily.         Follow-up Information    Follow up with Oliver Barre, MD. Schedule an appointment as soon as possible for a visit in 4 weeks. (to discuss optins for treatment of gout)    Contact information:   [address] [facility] [address] [place] [address] [place] [address] [phone]       Schedule an appointment as soon as possible for a visit with Olga Millers, MD. (post heart failure follow up)    Contact information:   [address] [place] [place] [address] [phone]         Wound Care: Aquacel to absorb drainage and provide antimicrobial benefits. Contact layer of Mepitel to protect tendon on right inner ankle area. Ace wrap for light compression. Change every 2 days.    Total Discharge Time: 35 mins  [facility]  [facility] Pager [phone]  10/07/2011, 9:39 AM

## 2011-10-07 NOTE — Progress Notes (Signed)
Patient ID: Theresa Hill, female   DOB: [birth year], 78 y.o.   MRN: [id number] Addendum to todays note: Discussed finding with Dr Thomasena Edis. Medical management of gout per medical services, follow up with her medical MD for long term management of gout, Ortho will follow cultures and if positive will decide further treatment.

## 2011-10-07 NOTE — Progress Notes (Signed)
Patient set to discharge to [facility] today. Daughter, Fulton Mole (cell#: [phone]) made aware & will meet patient at facility this afternoon. [facility] called for transport.   Clinical Social Work Department CLINICAL SOCIAL WORK PLACEMENT NOTE 10/07/2011  Patient:  Theresa Hill, Theresa Hill  Account Number:  [id number] Admit date:  10/03/2011  Clinical Social Worker:  Orpah Greek  Date/time:  10/06/2011 04:32 PM  Clinical Social Work is seeking post-discharge placement for this patient at the following level of care:   SKILLED NURSING   (*CSW will update this form in Epic as items are completed)   10/06/2011  Patient/family provided with [facility] Department of Clinical Social Work's list of facilities offering this level of care within the geographic area requested by the patient (or if unable, by the patient's family).  10/06/2011  Patient/family informed of their freedom to choose among providers that offer the needed level of care, that participate in Medicare, Medicaid or managed care program needed by the patient, have an available bed and are willing to accept the patient.  10/06/2011  Patient/family informed of [facility]' ownership interest in [facility], as well as of the fact that they are under no obligation to receive care at this facility.  PASARR submitted to EDS on 10/06/2011 PASARR number received from E[employer] on 10/06/2011  FL2 transmitted to all facilities in geographic area requested by pt/family on  10/06/2011 FL2 transmitted to all facilities within larger geographic area on   Patient informed that his/her managed care company has contracts with or will negotiate with  certain facilities, including the following:     Patient/family informed of bed offers received:  10/06/2011 Patient chooses bed at [facility] Physician recommends and patient chooses bed at    Patient to be transferred to [facility] on   10/07/2011 Patient to be transferred to facility by [facility]  The following physician request were entered in Epic:   Additional Comments:    Unice Bailey, LCSWA [phone]

## 2011-10-07 NOTE — Progress Notes (Signed)
Subjective: Right knee feeling much better. Got out of bed yesterday and felt good.   Objective: Vital signs in last 24 hours: Temp:  [97.4 F (36.3 C)-99.3 F (37.4 C)] 97.4 F (36.3 C) (05/24 0611) Pulse Rate:  [65-74] 65  (05/24 0611) Resp:  [16-20] 18  (05/24 0611) BP: (91-111)/(55-67) 111/63 mmHg (05/24 0611) SpO2:  [96 %-97 %] 96 % (05/24 0611) Weight:  [49 kg (108 lb 0.4 oz)-50.803 kg (112 lb)] 49 kg (108 lb 0.4 oz) (05/24 0611)  Intake/Output from previous day: 05/23 0701 - 05/24 0700 In: 720 [P.O.:720] Out: 1400 [Urine:1400] Intake/Output this shift: Total I/O In: 120 [P.O.:120] Out: 400 [Urine:400]   Basename 10/06/11 0515 10/05/11 0533  HGB 9.5* 9.6*    Basename 10/06/11 0515 10/05/11 0533  WBC 8.8 10.7*  RBC 2.90* 2.96*  HCT 28.4* 28.8*  PLT 244 249    Basename 10/07/11 0422 10/06/11 0515  NA 133* 133*  K 3.8 3.4*  CL 97 96  CO2 26 28  BUN 28* 21  CREATININE 1.06 1.04  GLUCOSE 115* 82  CALCIUM 8.9 8.5   No results found for this basename: LABPT:2,INR:2 in the last 72 hours  Cons alert appropriate, No distress. Laying in bed with knees bent at 90 degrees comfortable. Right knee has trace effusion but non tender , no erythema, or heat in knee, calf soft and non tender.foot NMVI, dressings on both ankles intact.  Assessment/Plan: Right knee pain and effusion - Gout attack, but final cultures pending but no growth at this time and improved with aspiration, and colchicine, and pred. Will decrease dose of colchicine to once a day, and continue with prednisone taper dose.. Will continue to monitor cultures. . If D/C home would continue with colchicine for 2 weeks at QD dose, finish taper. If still having problem with knee in two weeks would have PT make appointment in office with Dr Thomasena Edis [phone], due also has OA in knee. Appreciate consulting on this pleasant patient.    Jamelle Rushing 10/07/2011, 6:35 AM

## 2011-10-09 LAB — BODY FLUID CULTURE
Culture: NO GROWTH
Special Requests: NORMAL

## 2011-10-31 ENCOUNTER — Telehealth: Payer: Self-pay

## 2011-10-31 NOTE — Telephone Encounter (Signed)
[facility] will go out to see the patient tomorrow.  [facility] stated the patient was release from [facility] on the 14th, they ordered a bmet to be done today. Due to the families work schedule they would like to know if [facility] could do the blood work tomorrow or wait until Friday when the patient has appt. With Dr. Jonny Ruiz

## 2011-10-31 NOTE — Telephone Encounter (Signed)
Ok for tomorrow per [facility] - [phone]

## 2011-11-01 NOTE — Telephone Encounter (Signed)
Called left message to call back 

## 2011-11-01 NOTE — Telephone Encounter (Signed)
HHRN advised and will draw tomorrow as it is too late to do it today (needed to be at pt's home by 10 am due to family's schedule).

## 2011-11-04 ENCOUNTER — Encounter (HOSPITAL_BASED_OUTPATIENT_CLINIC_OR_DEPARTMENT_OTHER): Payer: Medicare Other | Attending: General Surgery

## 2011-11-04 ENCOUNTER — Ambulatory Visit: Payer: Medicare Other | Admitting: Internal Medicine

## 2011-11-04 DIAGNOSIS — L97309 Non-pressure chronic ulcer of unspecified ankle with unspecified severity: Secondary | ICD-10-CM | POA: Insufficient documentation

## 2011-11-04 DIAGNOSIS — I872 Venous insufficiency (chronic) (peripheral): Secondary | ICD-10-CM | POA: Insufficient documentation

## 2011-11-07 ENCOUNTER — Ambulatory Visit (INDEPENDENT_AMBULATORY_CARE_PROVIDER_SITE_OTHER): Payer: Medicare Other | Admitting: Internal Medicine

## 2011-11-07 ENCOUNTER — Encounter: Payer: Self-pay | Admitting: Internal Medicine

## 2011-11-07 VITALS — BP 120/72 | HR 78 | Temp 98.5°F | Ht 60.0 in | Wt 116.0 lb

## 2011-11-07 DIAGNOSIS — I509 Heart failure, unspecified: Secondary | ICD-10-CM

## 2011-11-07 DIAGNOSIS — J449 Chronic obstructive pulmonary disease, unspecified: Secondary | ICD-10-CM

## 2011-11-07 DIAGNOSIS — I10 Essential (primary) hypertension: Secondary | ICD-10-CM

## 2011-11-07 DIAGNOSIS — M109 Gout, unspecified: Secondary | ICD-10-CM

## 2011-11-07 NOTE — Patient Instructions (Addendum)
Continue all other medications as before Please keep your appointments with your specialists as you have planned - Dr Jens Som - please call for appt We will call [facility] for the lab results Please return in 4 months, or sooner if needed

## 2011-11-09 ENCOUNTER — Encounter: Payer: Self-pay | Admitting: Internal Medicine

## 2011-11-13 ENCOUNTER — Encounter: Payer: Self-pay | Admitting: Internal Medicine

## 2011-11-13 DIAGNOSIS — M109 Gout, unspecified: Secondary | ICD-10-CM | POA: Insufficient documentation

## 2011-11-13 NOTE — Progress Notes (Signed)
Subjective:    Patient ID: Theresa Hill, female    DOB: [birth year], 78 y.o.   MRN: [id number]  HPI here to f/u;  Pt denies chest pain, increased sob or doe, wheezing, orthopnea, PND, increased LE swelling, palpitations, dizziness or syncope.  Pt denies new neurological symptoms such as new headache, or facial or extremity weakness or numbness   Pt denies polydipsia, polyuria,   Pt states overall good compliance with meds.  Recent right knee gouty arthritis resolved with tx, no further pain or swelling, no recent falls.   Pt denies fever, wt loss, night sweats, loss of appetite, or other constitutional symptoms.  Had BMET drawn per home health but no results on chart today Past Medical History  Diagnosis Date  . HYPOTHYROIDISM 10/11/2006  . VITAMIN B12 DEFICIENCY 10/11/2006  . HYPONATREMIA 07/23/2007  . ANEMIA-NOS 09/14/2007  . HYPERTENSION 10/11/2006  . AORTIC STENOSIS 02/23/2007  . LEFT BUNDLE BRANCH BLOCK 10/11/2006  . CONGESTIVE HEART FAILURE 07/23/2007  . COPD 07/23/2007  . OSTEOPOROSIS 02/23/2007  . OSTEOPENIA 10/11/2006  . SAH (subarachnoid hemorrhage) 09/29/2010  . SDH (subdural hematoma) 09/29/2010  . GERD (gastroesophageal reflux disease) 09/29/2010  . DJD (degenerative joint disease) 09/29/2010   Past Surgical History  Procedure Date  . Tonsillectomy   . Abdominal hysterectomy   . Cataract surgery   . Appendectomy     reports that she has never smoked. She does not have any smokeless tobacco history on file. She reports that she does not drink alcohol or use illicit drugs. family history includes Cancer in her other; Diabetes in her other; and Thyroid disease in her maternal grandmother and mother. Allergies  Allergen Reactions  . Cephalexin     REACTION: vomit  . Doxycycline Hyclate     REACTION: vomit  . Sulfamethoxazole W-Trimethoprim     Current Outpatient Prescriptions on File Prior to Visit  Medication Sig Dispense Refill  . acetaminophen (TYLENOL) 325 MG tablet Take 2  tablets (650 mg total) by mouth every 6 (six) hours as needed (or Fever >/= 101).      Marland Kitchen albuterol (PROVENTIL) (5 MG/ML) 0.5% nebulizer solution Take 0.5 mLs (2.5 mg total) by nebulization every 4 (four) hours as needed for wheezing.  20 mL    . alendronate (FOSAMAX) 70 MG tablet TAKE 1 TABLET BY MOUTH EVERY WEEK  4 tablet  3  . Aspirin (ADULT ASPIRIN LOW STRENGTH) 81 MG EC tablet Take 81 mg by mouth daily.        Marland Kitchen Bioflavonoid Products (ESTER C PO) Take 1,000 mg by mouth daily.        . calcitRIOL (ROCALTROL) 0.25 MCG capsule take 1 capsule by mouth once daily  30 capsule  11  . calcium carbonate (OS-CAL) 600 MG TABS Take 600 mg by mouth daily.       . fexofenadine (ALLEGRA) 180 MG tablet Take 180 mg by mouth daily.        . furosemide (LASIX) 20 MG tablet Take 3 tablets (60 mg total) by mouth daily.  30 tablet    . levothyroxine (SYNTHROID, LEVOTHROID) 50 MCG tablet Take 50 mcg by mouth daily.      . Multiple Vitamin (MULITIVITAMIN WITH MINERALS) TABS Take 1 tablet by mouth daily.      Marland Kitchen omeprazole (PRILOSEC) 20 MG capsule take 1 capsule by mouth once daily  90 capsule  3  . potassium chloride SA (K-DUR,KLOR-CON) 20 MEQ tablet Take 10 mEq by mouth daily.      Marland Kitchen  triamcinolone (KENALOG) 0.1 % cream Apply topically 2 (two) times daily.        . colchicine 0.6 MG tablet Take 0.5 tablets (0.3 mg total) by mouth daily. For 2 weeks and then stop      . predniSONE (DELTASONE) 20 MG tablet 1 tablet twice daily for 4 days, then 1 tab once daily for 5 days and then STOP      . traMADol-acetaminophen (ULTRACET) 37.5-325 MG per tablet Take 1 tablet by mouth every 6 (six) hours as needed for pain.  30 tablet  0   Review of Systems  Constitutional: Negative for diaphoresis and unexpected weight change.  HENT: Negative for drooling and tinnitus.   Eyes: Negative for photophobia and visual disturbance.  Respiratory: Negative for choking and stridor.   Gastrointestinal: Negative for vomiting and blood in  stool.  Genitourinary: Negative for hematuria and decreased urine volume.  Musculoskeletal: Negative for gait problem.  Skin: Negative for color change and wound.  Neurological: Negative for tremors and numbness.     Objective:   Physical Exam BP 120/72  Pulse 78  Temp 98.5 F (36.9 C) (Oral)  Ht 5' (1.524 m)  Wt 116 lb (52.617 kg)  BMI 22.65 kg/m2  SpO2 94% Physical Exam  VS noted Constitutional: Pt appears well-developed and well-nourished.  HENT: Head: Normocephalic.  Right Ear: External ear normal.  Left Ear: External ear normal.  Eyes: Conjunctivae and EOM are normal. Pupils are equal, round, and reactive to light.  Neck: Normal range of motion. Neck supple.  Cardiovascular: Normal rate and regular rhythm.   Pulmonary/Chest: Effort normal and breath sounds decreased but no rales or wheezing.  Abd:  Soft, NT, non-distended, + BS Neurological: Pt is alert. Not confused, motor intact Skin: Skin is warm. No erythema.  Right knee no effusion, NT, FROM Psychiatric: Pt behavior is normal. Thought content normal. Not nervous or depressed affect    Assessment & Plan:

## 2011-11-13 NOTE — Assessment & Plan Note (Signed)
stable overall by hx and exam, most recent data reviewed with pt, and pt to continue medical treatment as before BP Readings from Last 3 Encounters:  11/07/11 120/72  10/07/11 111/63  09/09/11 122/70

## 2011-11-13 NOTE — Assessment & Plan Note (Signed)
Recent right knee  - resolved,  to f/u any worsening symptoms or concerns

## 2011-11-13 NOTE — Assessment & Plan Note (Signed)
stable overall by hx and exam, most recent data reviewed with pt, and pt to continue medical treatment as before SpO2 Readings from Last 3 Encounters:  11/07/11 94%  10/07/11 96%  04/14/11 97%

## 2011-11-13 NOTE — Assessment & Plan Note (Signed)
stable overall by hx and exam, most recent data reviewed with pt, and pt to continue medical treatment as before 

## 2011-11-25 ENCOUNTER — Encounter (HOSPITAL_BASED_OUTPATIENT_CLINIC_OR_DEPARTMENT_OTHER): Payer: Medicare Other | Attending: General Surgery

## 2011-11-25 DIAGNOSIS — I872 Venous insufficiency (chronic) (peripheral): Secondary | ICD-10-CM | POA: Insufficient documentation

## 2011-11-25 DIAGNOSIS — L97309 Non-pressure chronic ulcer of unspecified ankle with unspecified severity: Secondary | ICD-10-CM | POA: Insufficient documentation

## 2011-12-16 ENCOUNTER — Other Ambulatory Visit: Payer: Self-pay | Admitting: Internal Medicine

## 2011-12-23 ENCOUNTER — Encounter (HOSPITAL_BASED_OUTPATIENT_CLINIC_OR_DEPARTMENT_OTHER): Payer: Medicare Other

## 2011-12-26 ENCOUNTER — Other Ambulatory Visit: Payer: Self-pay | Admitting: Internal Medicine

## 2012-01-17 ENCOUNTER — Other Ambulatory Visit: Payer: Self-pay | Admitting: Endocrinology

## 2012-02-10 ENCOUNTER — Ambulatory Visit (INDEPENDENT_AMBULATORY_CARE_PROVIDER_SITE_OTHER): Payer: Medicare Other | Admitting: Cardiology

## 2012-02-10 ENCOUNTER — Encounter: Payer: Self-pay | Admitting: Cardiology

## 2012-02-10 ENCOUNTER — Other Ambulatory Visit: Payer: Self-pay | Admitting: *Deleted

## 2012-02-10 VITALS — BP 112/65 | HR 81 | Wt 113.0 lb

## 2012-02-10 DIAGNOSIS — I359 Nonrheumatic aortic valve disorder, unspecified: Secondary | ICD-10-CM

## 2012-02-10 DIAGNOSIS — E876 Hypokalemia: Secondary | ICD-10-CM

## 2012-02-10 DIAGNOSIS — R0609 Other forms of dyspnea: Secondary | ICD-10-CM

## 2012-02-10 DIAGNOSIS — I509 Heart failure, unspecified: Secondary | ICD-10-CM

## 2012-02-10 LAB — BASIC METABOLIC PANEL
BUN: 32 mg/dL — ABNORMAL HIGH (ref 6–23)
Chloride: 101 mEq/L (ref 96–112)
Creatinine, Ser: 1 mg/dL (ref 0.4–1.2)
Glucose, Bld: 97 mg/dL (ref 70–99)

## 2012-02-10 MED ORDER — POTASSIUM CHLORIDE CRYS ER 20 MEQ PO TBCR: 20.0000 meq | EXTENDED_RELEASE_TABLET | Freq: Every day | ORAL | Status: DC

## 2012-02-10 NOTE — Assessment & Plan Note (Signed)
The patient has severe aortic stenosis. Given her age and overall medical condition she is not a candidate for valve replacement. They understand this will progress and will ultimately take her life. Conservative measures only. Continue Lasix at present dose. Take additional 20 mg daily PRN increased dyspnea or edema. No plans to repeat echocardiogram. Patient is a no CODE BLUE including no defibrillation, CPR or intubation. I have not added an ACE inhibitor or beta blocker previously as her blood pressure is low normal and she has severe aortic stenosis. Check potassium and renal function.

## 2012-02-10 NOTE — Patient Instructions (Addendum)
Your physician wants you to follow-up in: 6 MONTHS WITH DR CRENSHAW You will receive a reminder letter in the mail two months in advance. If you don't receive a letter, please call our office to schedule the follow-up appointment.   Your physician recommends that you HAVE LAB WORK TODAY 

## 2012-02-10 NOTE — Progress Notes (Signed)
HPI: Pleasant female I initially saw in July of 2012 for evaluation of congestive heart failure. Echocardiogram in June 2012 showed an ejection fraction of 40%, severe aortic stenosis with a mean gradient of 43 mmHg, mild AI, moderate mitral regurgitation, moderate left atrial enlargement, mild right atrial and right ventricular enlargement. When I saw her previously I explained that she was not a candidate for aortic valve replacement and she and her daughter were also in agreement with that. She is a no CODE BLUE and we are only treating medically and not pursuing followup echocardiograms. Since last saw her in April of 2013, she has limited mobility.  She does have continued dyspnea on exertion but unchanged. No orthopnea or PND. Chronic unchanged pedal edema. No chest pain or syncope.   Current Outpatient Prescriptions  Medication Sig Dispense Refill  . acetaminophen (TYLENOL) 325 MG tablet Take 2 tablets (650 mg total) by mouth every 6 (six) hours as needed (or Fever >/= 101).      Marland Kitchen alendronate (FOSAMAX) 70 MG tablet TAKE 1 TABLET BY MOUTH EVERY WEEK  4 tablet  3  . Aspirin (ADULT ASPIRIN LOW STRENGTH) 81 MG EC tablet Take 81 mg by mouth daily.        Marland Kitchen Bioflavonoid Products (ESTER C PO) Take 1,000 mg by mouth daily.        . calcitRIOL (ROCALTROL) 0.25 MCG capsule take 1 capsule by mouth once daily  30 capsule  2  . calcium carbonate (OS-CAL) 600 MG TABS Take 600 mg by mouth daily.       . fexofenadine (ALLEGRA) 180 MG tablet Take 180 mg by mouth daily.        . furosemide (LASIX) 20 MG tablet take 3 tablets by mouth daily  90 tablet  0  . levothyroxine (SYNTHROID, LEVOTHROID) 50 MCG tablet Take 50 mcg by mouth daily.      . Multiple Vitamin (MULITIVITAMIN WITH MINERALS) TABS Take 1 tablet by mouth daily.      Marland Kitchen omeprazole (PRILOSEC) 20 MG capsule take 1 capsule by mouth once daily  90 capsule  3  . potassium chloride SA (K-DUR,KLOR-CON) 20 MEQ tablet Take 10 mEq by mouth daily.      .  predniSONE (DELTASONE) 20 MG tablet 1 tablet twice daily for 4 days, then 1 tab once daily for 5 days and then STOP      . traMADol-acetaminophen (ULTRACET) 37.5-325 MG per tablet Take 1 tablet by mouth every 6 (six) hours as needed for pain.  30 tablet  0  . triamcinolone (KENALOG) 0.1 % cream Apply topically 2 (two) times daily.           Past Medical History  Diagnosis Date  . HYPOTHYROIDISM 10/11/2006  . VITAMIN B12 DEFICIENCY 10/11/2006  . HYPONATREMIA 07/23/2007  . ANEMIA-NOS 09/14/2007  . HYPERTENSION 10/11/2006  . AORTIC STENOSIS 02/23/2007  . LEFT BUNDLE BRANCH BLOCK 10/11/2006  . CONGESTIVE HEART FAILURE 07/23/2007  . COPD 07/23/2007  . OSTEOPOROSIS 02/23/2007  . OSTEOPENIA 10/11/2006  . SAH (subarachnoid hemorrhage) 09/29/2010  . SDH (subdural hematoma) 09/29/2010  . GERD (gastroesophageal reflux disease) 09/29/2010  . DJD (degenerative joint disease) 09/29/2010    Past Surgical History  Procedure Date  . Tonsillectomy   . Abdominal hysterectomy   . Cataract surgery   . Appendectomy     History   Social History  . Marital Status: Widowed    Spouse Name: N/A    Number of Children: 1  .  Years of Education: N/A   Occupational History  .     Social History Main Topics  . Smoking status: Never Smoker   . Smokeless tobacco: Not on file  . Alcohol Use: No  . Drug Use: No  . Sexually Active: Not on file   Other Topics Concern  . Not on file   Social History Narrative   Lives with daughter.    ROS: no fevers or chills, productive cough, hemoptysis, dysphasia, odynophagia, melena, hematochezia, dysuria, hematuria, rash, seizure activity, orthopnea, PND,  claudication. Remaining systems are negative.  Physical Exam: Well-developed frail in no acute distress.  Skin is warm and dry.  HEENT is normal.  Neck is supple.  Chest is clear to auscultation with normal expansion.  Cardiovascular exam is regular rate and rhythm. 3/6 systolic murmur left sternal border. S2 is  diminished. Abdominal exam nontender or distended. No masses palpated. Extremities show 1+ edema and chronic skin changes. neuro grossly intact  ECG sinus rhythm with occasional PVCs. Left bundle branch block.

## 2012-02-10 NOTE — Assessment & Plan Note (Signed)
Symptoms appear reasonably stable.continue present dose of diuretic and take an additional 20 mg of Lasix daily as needed. Check potassium and renal function.

## 2012-02-17 ENCOUNTER — Other Ambulatory Visit: Payer: Self-pay | Admitting: *Deleted

## 2012-02-17 ENCOUNTER — Other Ambulatory Visit: Payer: Self-pay | Admitting: Internal Medicine

## 2012-02-17 ENCOUNTER — Other Ambulatory Visit (INDEPENDENT_AMBULATORY_CARE_PROVIDER_SITE_OTHER): Payer: Medicare Other

## 2012-02-17 ENCOUNTER — Encounter (HOSPITAL_BASED_OUTPATIENT_CLINIC_OR_DEPARTMENT_OTHER): Payer: Medicare Other | Attending: General Surgery

## 2012-02-17 DIAGNOSIS — E876 Hypokalemia: Secondary | ICD-10-CM

## 2012-02-17 DIAGNOSIS — I872 Venous insufficiency (chronic) (peripheral): Secondary | ICD-10-CM | POA: Insufficient documentation

## 2012-02-17 DIAGNOSIS — L97909 Non-pressure chronic ulcer of unspecified part of unspecified lower leg with unspecified severity: Secondary | ICD-10-CM | POA: Insufficient documentation

## 2012-02-17 LAB — BASIC METABOLIC PANEL
CO2: 30 mEq/L (ref 19–32)
Calcium: 9.9 mg/dL (ref 8.4–10.5)
Creatinine, Ser: 1.1 mg/dL (ref 0.4–1.2)
GFR: 49.69 mL/min — ABNORMAL LOW (ref >60.00)
Glucose, Bld: 86 mg/dL (ref 70–99)
Sodium: 137 mEq/L (ref 135–145)

## 2012-02-17 MED ORDER — POTASSIUM CHLORIDE CRYS ER 20 MEQ PO TBCR: 20.0000 meq | EXTENDED_RELEASE_TABLET | Freq: Two times a day (BID) | ORAL | Status: DC

## 2012-02-24 ENCOUNTER — Other Ambulatory Visit (INDEPENDENT_AMBULATORY_CARE_PROVIDER_SITE_OTHER): Payer: Medicare Other

## 2012-02-24 DIAGNOSIS — E876 Hypokalemia: Secondary | ICD-10-CM

## 2012-02-24 LAB — BASIC METABOLIC PANEL
BUN: 44 mg/dL — ABNORMAL HIGH (ref 6–23)
CO2: 33 mEq/L — ABNORMAL HIGH (ref 19–32)
Calcium: 10 mg/dL (ref 8.4–10.5)
Chloride: 97 mEq/L (ref 96–112)
Creatinine, Ser: 1 mg/dL (ref 0.4–1.2)
GFR: 56.18 mL/min — ABNORMAL LOW (ref >60.00)
Glucose, Bld: 102 mg/dL — ABNORMAL HIGH (ref 70–99)
Potassium: 4.2 mEq/L (ref 3.5–5.1)
Sodium: 138 mEq/L (ref 135–145)

## 2012-02-27 ENCOUNTER — Encounter (HOSPITAL_BASED_OUTPATIENT_CLINIC_OR_DEPARTMENT_OTHER): Payer: Medicare Other

## 2012-02-27 ENCOUNTER — Telehealth: Payer: Self-pay | Admitting: Cardiology

## 2012-02-27 MED ORDER — POTASSIUM CHLORIDE CRYS ER 20 MEQ PO TBCR: 20.0000 meq | EXTENDED_RELEASE_TABLET | Freq: Two times a day (BID) | ORAL | Status: DC

## 2012-02-27 NOTE — Telephone Encounter (Signed)
Pt had potassium check on Friday and would like results

## 2012-02-27 NOTE — Telephone Encounter (Signed)
Spoke with pt dtr, aware potassium is back to normal. New script sent to pharm.

## 2012-03-20 ENCOUNTER — Other Ambulatory Visit: Payer: Self-pay | Admitting: Internal Medicine

## 2012-03-23 ENCOUNTER — Encounter (HOSPITAL_BASED_OUTPATIENT_CLINIC_OR_DEPARTMENT_OTHER): Payer: Medicare Other | Attending: General Surgery

## 2012-03-23 DIAGNOSIS — L97909 Non-pressure chronic ulcer of unspecified part of unspecified lower leg with unspecified severity: Secondary | ICD-10-CM | POA: Insufficient documentation

## 2012-03-28 ENCOUNTER — Ambulatory Visit: Payer: Medicare Other

## 2012-04-18 ENCOUNTER — Encounter (HOSPITAL_BASED_OUTPATIENT_CLINIC_OR_DEPARTMENT_OTHER): Payer: Medicare Other | Attending: General Surgery

## 2012-04-18 DIAGNOSIS — L97309 Non-pressure chronic ulcer of unspecified ankle with unspecified severity: Secondary | ICD-10-CM | POA: Insufficient documentation

## 2012-04-18 DIAGNOSIS — I87309 Chronic venous hypertension (idiopathic) without complications of unspecified lower extremity: Secondary | ICD-10-CM | POA: Insufficient documentation

## 2012-04-18 DIAGNOSIS — I872 Venous insufficiency (chronic) (peripheral): Secondary | ICD-10-CM | POA: Insufficient documentation

## 2012-04-24 ENCOUNTER — Other Ambulatory Visit: Payer: Self-pay | Admitting: Internal Medicine

## 2012-04-27 ENCOUNTER — Encounter (HOSPITAL_BASED_OUTPATIENT_CLINIC_OR_DEPARTMENT_OTHER): Payer: Medicare Other

## 2012-05-18 ENCOUNTER — Encounter (HOSPITAL_BASED_OUTPATIENT_CLINIC_OR_DEPARTMENT_OTHER): Payer: Medicare Other | Attending: General Surgery

## 2012-05-18 DIAGNOSIS — L97309 Non-pressure chronic ulcer of unspecified ankle with unspecified severity: Secondary | ICD-10-CM | POA: Insufficient documentation

## 2012-05-18 DIAGNOSIS — I872 Venous insufficiency (chronic) (peripheral): Secondary | ICD-10-CM | POA: Insufficient documentation

## 2012-06-22 ENCOUNTER — Encounter (HOSPITAL_BASED_OUTPATIENT_CLINIC_OR_DEPARTMENT_OTHER): Payer: Medicare Other | Attending: General Surgery

## 2012-06-22 DIAGNOSIS — L97909 Non-pressure chronic ulcer of unspecified part of unspecified lower leg with unspecified severity: Secondary | ICD-10-CM | POA: Insufficient documentation

## 2012-06-22 DIAGNOSIS — I872 Venous insufficiency (chronic) (peripheral): Secondary | ICD-10-CM | POA: Insufficient documentation

## 2012-06-22 DIAGNOSIS — L97309 Non-pressure chronic ulcer of unspecified ankle with unspecified severity: Secondary | ICD-10-CM | POA: Insufficient documentation

## 2012-06-26 ENCOUNTER — Other Ambulatory Visit: Payer: Self-pay | Admitting: Internal Medicine

## 2012-06-26 MED ORDER — ALBUTEROL SULFATE HFA 108 (90 BASE) MCG/ACT IN AERS: 2.0000 | INHALATION_SPRAY | Freq: Four times a day (QID) | RESPIRATORY_TRACT | Status: DC | PRN

## 2012-07-20 ENCOUNTER — Encounter (HOSPITAL_BASED_OUTPATIENT_CLINIC_OR_DEPARTMENT_OTHER): Payer: Medicare Other | Attending: General Surgery

## 2012-07-20 DIAGNOSIS — I87309 Chronic venous hypertension (idiopathic) without complications of unspecified lower extremity: Secondary | ICD-10-CM | POA: Insufficient documentation

## 2012-07-20 DIAGNOSIS — L97409 Non-pressure chronic ulcer of unspecified heel and midfoot with unspecified severity: Secondary | ICD-10-CM | POA: Insufficient documentation

## 2012-07-20 DIAGNOSIS — L97809 Non-pressure chronic ulcer of other part of unspecified lower leg with unspecified severity: Secondary | ICD-10-CM | POA: Insufficient documentation

## 2012-07-23 ENCOUNTER — Ambulatory Visit (INDEPENDENT_AMBULATORY_CARE_PROVIDER_SITE_OTHER)
Admission: RE | Admit: 2012-07-23 | Discharge: 2012-07-23 | Disposition: A | Discharge status: Home / Self Care | Payer: Medicare Other | Source: Ambulatory Visit | Attending: Internal Medicine | Admitting: Internal Medicine

## 2012-07-23 ENCOUNTER — Encounter: Payer: Self-pay | Admitting: Internal Medicine

## 2012-07-23 ENCOUNTER — Other Ambulatory Visit (INDEPENDENT_AMBULATORY_CARE_PROVIDER_SITE_OTHER): Payer: Medicare Other

## 2012-07-23 ENCOUNTER — Ambulatory Visit (INDEPENDENT_AMBULATORY_CARE_PROVIDER_SITE_OTHER): Payer: Medicare Other | Admitting: Internal Medicine

## 2012-07-23 VITALS — BP 92/62 | HR 69 | Temp 97.7°F

## 2012-07-23 DIAGNOSIS — J441 Chronic obstructive pulmonary disease with (acute) exacerbation: Secondary | ICD-10-CM

## 2012-07-23 DIAGNOSIS — R5381 Other malaise: Secondary | ICD-10-CM

## 2012-07-23 DIAGNOSIS — R5383 Other fatigue: Secondary | ICD-10-CM

## 2012-07-23 DIAGNOSIS — R062 Wheezing: Secondary | ICD-10-CM

## 2012-07-23 DIAGNOSIS — J209 Acute bronchitis, unspecified: Secondary | ICD-10-CM

## 2012-07-23 LAB — CBC
RBC: 3.32 Mil/uL — ABNORMAL LOW (ref 3.87–5.11)
RDW: 17.7 % — ABNORMAL HIGH (ref 11.5–14.6)
WBC: 7.7 10*3/uL (ref 4.5–10.5)

## 2012-07-23 MED ORDER — METHYLPREDNISOLONE ACETATE 80 MG/ML IJ SUSP
80.0000 mg | Freq: Once | INTRAMUSCULAR | Status: AC
  Administered 2012-07-23: 80 mg via INTRAMUSCULAR

## 2012-07-23 MED ORDER — LEVOFLOXACIN 500 MG PO TABS: 500.0000 mg | ORAL_TABLET | Freq: Every day | ORAL | Status: DC

## 2012-07-23 NOTE — Addendum Note (Signed)
Addended by: Carin Primrose on: 07/23/2012 04:06 PM   Modules accepted: Orders

## 2012-07-23 NOTE — Patient Instructions (Signed)
Bronchospasm A bronchospasm is when the tubes that carry air in and out of your lungs (bronchioles) become smaller. It is hard to breathe when this happens. A bronchospasm can be caused by:  Asthma.  Allergies.  Lung infection. HOME CARE   Do not  smoke. Avoid places that have secondhand smoke.  Dust your house often. Have your air ducts cleaned once or twice a year.  Find out what allergies may cause your bronchospasms.  Use your inhaler properly if you have one. Know when to use it.  Eat healthy foods and drink plenty of water.  Only take medicine as told by your doctor. GET HELP RIGHT AWAY IF:  You feel you cannot breathe or catch your breath.  You cannot stop coughing.  Your treatment is not helping you breathe better. MAKE SURE YOU:   Understand these instructions.  Will watch your condition.  Will get help right away if you are not doing well or get worse. Document Released: 02/27/2009 Document Revised: 07/25/2011 Document Reviewed: 02/27/2009 Exit[employer] Patient Information 2013 [employer].  

## 2012-07-23 NOTE — Progress Notes (Signed)
HPI  Pt presents to the clinic today with c/o productive cough and wheezing for the past 3-4 days. She is coughing up white phlegm. She does feel fatigued. She is sleeping a lot. She denies headache, dizziness, fever, nausea or vomiting. She has not taken anything OTC. She has been using her inhaler which is helping, but she can still hear herself wheezing. Her flu shot is up to date. She has not had sick contacts that she knows of.  Review of Systems      Past Medical History  Diagnosis Date  . HYPOTHYROIDISM 10/11/2006  . VITAMIN B12 DEFICIENCY 10/11/2006  . HYPONATREMIA 07/23/2007  . ANEMIA-NOS 09/14/2007  . HYPERTENSION 10/11/2006  . AORTIC STENOSIS 02/23/2007  . LEFT BUNDLE BRANCH BLOCK 10/11/2006  . CONGESTIVE HEART FAILURE 07/23/2007  . COPD 07/23/2007  . OSTEOPOROSIS 02/23/2007  . OSTEOPENIA 10/11/2006  . SAH (subarachnoid hemorrhage) 09/29/2010  . SDH (subdural hematoma) 09/29/2010  . GERD (gastroesophageal reflux disease) 09/29/2010  . DJD (degenerative joint disease) 09/29/2010    Family History  Problem Relation Age of Onset  . Thyroid disease Mother   . Thyroid disease Maternal Grandmother   . Cancer Other     breast  . Diabetes Other     History   Social History  . Marital Status: Widowed    Spouse Name: N/A    Number of Children: 1  . Years of Education: N/A   Occupational History  .     Social History Main Topics  . Smoking status: Never Smoker   . Smokeless tobacco: Not on file  . Alcohol Use: No  . Drug Use: No  . Sexually Active: Not on file   Other Topics Concern  . Not on file   Social History Narrative   Lives with daughter.    Allergies  Allergen Reactions  . Cephalexin     REACTION: vomit  . Doxycycline Hyclate     REACTION: vomit  . Sulfamethoxazole W-Trimethoprim      Constitutional: Positive fatigue. Denies headache, fatigue or abrupt weight changes.  HEENT:   Denies eye redness, eye pain, pressure behind the eyes, facial pain, nasal  congestion, ear pain, ringing in the ears, wax buildup, runny nose or bloody nose. Respiratory: Positive cough. Denies difficulty breathing or shortness of breath.  Cardiovascular: Denies chest pain, chest tightness, palpitations or swelling in the hands.   No other specific complaints in a complete review of systems (except as listed in HPI above).  Objective:   BP 92/62  Pulse 69  Temp(Src) 97.7 F (36.5 C) (Oral)  SpO2 94% Wt Readings from Last 3 Encounters:  02/10/12 113 lb (51.256 kg)  11/07/11 116 lb (52.617 kg)  10/07/11 108 lb 0.4 oz (49 kg)     General: Appears her stated age, well developed, well nourished in NAD. HEENT: Head: normal shape and size; Eyes: sclera white, no icterus, conjunctiva pink, PERRLA and EOMs intact; Ears: Tm's gray and intact, normal light reflex; Nose: mucosa pink and moist, septum midline; Throat/Mouth: + PND. Teeth present, mucosa erythematous and moist, no exudate noted, no lesions or ulcerations noted.  Neck: Mild cervical lymphadenopathy. Neck supple, trachea midline. No massses, lumps or thyromegaly present.  Cardiovascular: Normal rate and rhythm. S1,S2 noted. No carotid bruits noted. Pulmonary/Chest: Normal effort and fine rhonchi and wheezing noted. No respiratory distress.      Assessment & Plan:   Acute bronchitis, new onset with additional workup required:  Get some rest and drink plenty of  water Do salt water gargles for the sore throat Will obtain chest xray to r/o pneumonia Will obtain cbc, BMET to assess for signs of infection Erx for Levaquin x 7 days 80 mg Depo IM today   RTC as needed or if symptoms persist.

## 2012-07-24 LAB — BASIC METABOLIC PANEL
CO2: 30 mEq/L (ref 19–32)
Calcium: 9.3 mg/dL (ref 8.4–10.5)
Chloride: 105 mEq/L (ref 96–112)
Glucose, Bld: 83 mg/dL (ref 70–99)
Potassium: 4.6 mEq/L (ref 3.5–5.1)
Sodium: 139 mEq/L (ref 135–145)

## 2012-07-26 ENCOUNTER — Telehealth: Payer: Self-pay | Admitting: Internal Medicine

## 2012-07-26 ENCOUNTER — Other Ambulatory Visit: Payer: Self-pay | Admitting: Internal Medicine

## 2012-07-26 MED ORDER — PHENYLEPHRINE-CHLORPHEN-DM 5-2-15 MG/5ML PO SYRP: 5.0000 mL | ORAL_SOLUTION | Freq: Three times a day (TID) | ORAL | Status: DC | PRN

## 2012-07-26 NOTE — Telephone Encounter (Signed)
Theresa Hill from [facility] [phone] states a prescription was received for Remetussin 5-2-15mg - Take 5ml TID prn. States Remetussin is not available.  States they do have Bromfed in stock. Please call pharmacy. Thanks

## 2012-07-26 NOTE — Telephone Encounter (Signed)
[facility] informed.

## 2012-07-26 NOTE — Progress Notes (Signed)
Left message informing pt's family rx for cough syrup sent to [facility].

## 2012-07-26 NOTE — Telephone Encounter (Signed)
Theresa Hill, Ok to switch to [facility]

## 2012-08-17 ENCOUNTER — Encounter (HOSPITAL_BASED_OUTPATIENT_CLINIC_OR_DEPARTMENT_OTHER): Payer: Medicare Other | Attending: General Surgery

## 2012-08-17 DIAGNOSIS — L97909 Non-pressure chronic ulcer of unspecified part of unspecified lower leg with unspecified severity: Secondary | ICD-10-CM | POA: Insufficient documentation

## 2012-08-20 ENCOUNTER — Other Ambulatory Visit: Payer: Self-pay | Admitting: Internal Medicine

## 2012-09-04 ENCOUNTER — Other Ambulatory Visit: Payer: Self-pay | Admitting: Internal Medicine

## 2012-09-28 ENCOUNTER — Encounter (HOSPITAL_BASED_OUTPATIENT_CLINIC_OR_DEPARTMENT_OTHER): Payer: Medicare Other | Attending: General Surgery

## 2012-09-28 DIAGNOSIS — L97909 Non-pressure chronic ulcer of unspecified part of unspecified lower leg with unspecified severity: Secondary | ICD-10-CM | POA: Insufficient documentation

## 2012-10-10 ENCOUNTER — Encounter: Payer: Self-pay | Admitting: Cardiology

## 2012-10-10 ENCOUNTER — Ambulatory Visit (INDEPENDENT_AMBULATORY_CARE_PROVIDER_SITE_OTHER): Payer: Medicare Other | Admitting: Cardiology

## 2012-10-10 VITALS — BP 98/70 | HR 76

## 2012-10-10 DIAGNOSIS — I509 Heart failure, unspecified: Secondary | ICD-10-CM

## 2012-10-10 DIAGNOSIS — R0609 Other forms of dyspnea: Secondary | ICD-10-CM

## 2012-10-10 DIAGNOSIS — I359 Nonrheumatic aortic valve disorder, unspecified: Secondary | ICD-10-CM

## 2012-10-10 NOTE — Assessment & Plan Note (Signed)
The patient has severe aortic stenosis. Given her age and overall medical condition she is not a candidate for valve replacement. Sh.e does not want to consider other procedures such as TAVR. They understand this will progress and will ultimately take her life. Conservative measures only. Continue Lasix at present dose. Take additional 20 mg daily PRN increased dyspnea or edema. No plans to repeat echocardiogram. Patient is a no CODE BLUE including no defibrillation, CPR or intubation. I have not added an ACE inhibitor or beta blocker previously as her blood pressure is low normal and she has severe aortic stenosis. Check potassium and renal function.

## 2012-10-10 NOTE — Patient Instructions (Addendum)
Your physician wants you to follow-up in: 6 MONTHS WITH DR CRENSHAW You will receive a reminder letter in the mail two months in advance. If you don't receive a letter, please call our office to schedule the follow-up appointment.   Your physician recommends that you HAVE LAB WORK TODAY 

## 2012-10-10 NOTE — Progress Notes (Signed)
HPI: Pleasant female for fu of AS. Echocardiogram in June 2012 showed an ejection fraction of 40%, severe aortic stenosis with a mean gradient of 43 mmHg, mild AI, moderate mitral regurgitation, moderate left atrial enlargement, mild right atrial and right ventricular enlargement. When I saw her previously I explained that she was not a candidate for aortic valve replacement and she and her daughter were also in agreement with that. She is a no CODE BLUE and we are only treating medically and not pursuing followup echocardiograms. Since last saw her in Sept of 2013, she notes some dyspnea on exertion but unchanged. No orthopnea or PND. She continues to have chronic mild pedal edema. No chest pain or syncope.  Current Outpatient Prescriptions  Medication Sig Dispense Refill  . albuterol (PROVENTIL HFA;VENTOLIN HFA) 108 (90 BASE) MCG/ACT inhaler Inhale 2 puffs into the lungs every 6 (six) hours as needed for wheezing.  1 Inhaler  11  . alendronate (FOSAMAX) 70 MG tablet take 1 tablet by mouth every week  4 tablet  3  . Aspirin (ADULT ASPIRIN LOW STRENGTH) 81 MG EC tablet Take 81 mg by mouth daily.        Marland Kitchen Bioflavonoid Products (ESTER C PO) Take 1,000 mg by mouth daily.        . calcitRIOL (ROCALTROL) 0.25 MCG capsule take 1 capsule by mouth once daily  90 capsule  3  . calcium carbonate (OS-CAL) 600 MG TABS Take 600 mg by mouth daily.       . fexofenadine (ALLEGRA) 180 MG tablet Take 180 mg by mouth daily.        . furosemide (LASIX) 20 MG tablet take 3 tablets by mouth once daily  90 tablet  11  . levofloxacin (LEVAQUIN) 500 MG tablet Take 1 tablet (500 mg total) by mouth daily.  7 tablet  0  . levothyroxine (SYNTHROID, LEVOTHROID) 50 MCG tablet Take 50 mcg by mouth daily.      Marland Kitchen levothyroxine (SYNTHROID, LEVOTHROID) 75 MCG tablet take 1 tablet by mouth once daily  90 tablet  3  . Multiple Vitamin (MULITIVITAMIN WITH MINERALS) TABS Take 1 tablet by mouth daily.      Marland Kitchen omeprazole (PRILOSEC) 20 MG  capsule take 1 capsule by mouth once daily  90 capsule  3  . Phenylephrine-Chlorphen-DM (REME TUSSIN DM) 09-14-13 MG/5ML SYRP Take 5 mLs by mouth 3 (three) times daily as needed.  1 Bottle  0  . potassium chloride SA (K-DUR,KLOR-CON) 20 MEQ tablet Take 1 tablet (20 mEq total) by mouth 2 (two) times daily.  60 tablet  12  . traMADol-acetaminophen (ULTRACET) 37.5-325 MG per tablet Take 1 tablet by mouth every 6 (six) hours as needed for pain.  30 tablet  0  . triamcinolone (KENALOG) 0.1 % cream Apply topically 2 (two) times daily.         No current facility-administered medications for this visit.     Past Medical History  Diagnosis Date  . HYPOTHYROIDISM 10/11/2006  . VITAMIN B12 DEFICIENCY 10/11/2006  . HYPONATREMIA 07/23/2007  . ANEMIA-NOS 09/14/2007  . HYPERTENSION 10/11/2006  . AORTIC STENOSIS 02/23/2007  . LEFT BUNDLE BRANCH BLOCK 10/11/2006  . CONGESTIVE HEART FAILURE 07/23/2007  . COPD 07/23/2007  . OSTEOPOROSIS 02/23/2007  . OSTEOPENIA 10/11/2006  . SAH (subarachnoid hemorrhage) 09/29/2010  . SDH (subdural hematoma) 09/29/2010  . GERD (gastroesophageal reflux disease) 09/29/2010  . DJD (degenerative joint disease) 09/29/2010    Past Surgical History  Procedure Laterality Date  .  Tonsillectomy    . Abdominal hysterectomy    . Cataract surgery    . Appendectomy      History   Social History  . Marital Status: Widowed    Spouse Name: N/A    Number of Children: 1  . Years of Education: N/A   Occupational History  .     Social History Main Topics  . Smoking status: Never Smoker   . Smokeless tobacco: Not on file  . Alcohol Use: No  . Drug Use: No  . Sexually Active: Not on file   Other Topics Concern  . Not on file   Social History Narrative   Lives with daughter.    ROS: no fevers or chills, productive cough, hemoptysis, dysphasia, odynophagia, melena, hematochezia, dysuria, hematuria, rash, seizure activity, orthopnea, PND, pedal edema, claudication. Remaining systems  are negative.  Physical Exam: Well-developed frail in no acute distress.  Skin is warm and dry.  HEENT is normal.  Neck is supple.  Chest is clear to auscultation with normal expansion.  Cardiovascular exam is regular rate and rhythm. 3/6 systolic murmur left sternal border. S2 is diminished. Abdominal exam nontender or distended. No masses palpated. Extremities show 1 + edema. neuro grossly intact  ECG sinus rhythm, left ventricular hypertrophy with repolarization abnormality, left axis deviation, QRS widening, prior septal infarct.

## 2012-10-10 NOTE — Assessment & Plan Note (Signed)
Continue present dose of diuretic. Take additional 20 mg when necessary increased edema or weight gain of 2-3 pounds. Check potassium and renal function.

## 2012-10-11 LAB — BASIC METABOLIC PANEL
BUN: 27 mg/dL — ABNORMAL HIGH (ref 6–23)
CO2: 29 mEq/L (ref 19–32)
Chloride: 98 mEq/L (ref 96–112)
Creatinine, Ser: 0.9 mg/dL (ref 0.4–1.2)
Potassium: 3.9 mEq/L (ref 3.5–5.1)

## 2012-10-19 ENCOUNTER — Other Ambulatory Visit: Payer: Self-pay | Admitting: Internal Medicine

## 2012-10-26 ENCOUNTER — Encounter (HOSPITAL_BASED_OUTPATIENT_CLINIC_OR_DEPARTMENT_OTHER): Payer: Medicare Other | Attending: General Surgery

## 2012-10-26 DIAGNOSIS — I872 Venous insufficiency (chronic) (peripheral): Secondary | ICD-10-CM | POA: Insufficient documentation

## 2012-10-26 DIAGNOSIS — L97309 Non-pressure chronic ulcer of unspecified ankle with unspecified severity: Secondary | ICD-10-CM | POA: Insufficient documentation

## 2012-11-09 ENCOUNTER — Encounter: Payer: Self-pay | Admitting: Internal Medicine

## 2012-11-09 ENCOUNTER — Other Ambulatory Visit (INDEPENDENT_AMBULATORY_CARE_PROVIDER_SITE_OTHER): Payer: Medicare Other

## 2012-11-09 ENCOUNTER — Ambulatory Visit (INDEPENDENT_AMBULATORY_CARE_PROVIDER_SITE_OTHER): Payer: Medicare Other | Admitting: Internal Medicine

## 2012-11-09 VITALS — BP 108/70 | HR 74 | Temp 98.0°F | Wt 110.0 lb

## 2012-11-09 DIAGNOSIS — L03115 Cellulitis of right lower limb: Secondary | ICD-10-CM | POA: Insufficient documentation

## 2012-11-09 DIAGNOSIS — Z0000 Encounter for general adult medical examination without abnormal findings: Secondary | ICD-10-CM

## 2012-11-09 DIAGNOSIS — L02419 Cutaneous abscess of limb, unspecified: Secondary | ICD-10-CM

## 2012-11-09 DIAGNOSIS — I10 Essential (primary) hypertension: Secondary | ICD-10-CM

## 2012-11-09 DIAGNOSIS — E785 Hyperlipidemia, unspecified: Secondary | ICD-10-CM

## 2012-11-09 DIAGNOSIS — L03119 Cellulitis of unspecified part of limb: Secondary | ICD-10-CM

## 2012-11-09 LAB — LIPID PANEL
HDL: 80.6 mg/dL (ref >39.00)
Triglycerides: 35 mg/dL (ref 0.0–149.0)

## 2012-11-09 LAB — HEPATIC FUNCTION PANEL
Albumin: 3.8 g/dL (ref 3.5–5.2)
Total Protein: 7.5 g/dL (ref 6.0–8.3)

## 2012-11-09 LAB — CBC WITH DIFFERENTIAL/PLATELET
Basophils Absolute: 0 10*3/uL (ref 0.0–0.1)
Basophils Relative: 0.5 % (ref 0.0–3.0)
Hemoglobin: 10.9 g/dL — ABNORMAL LOW (ref 12.0–15.0)
Lymphocytes Relative: 14.1 % (ref 12.0–46.0)
Monocytes Relative: 12.7 % — ABNORMAL HIGH (ref 3.0–12.0)
Neutro Abs: 5.4 10*3/uL (ref 1.4–7.7)
RBC: 3.08 Mil/uL — ABNORMAL LOW (ref 3.87–5.11)

## 2012-11-09 LAB — BASIC METABOLIC PANEL
CO2: 28 mEq/L (ref 19–32)
Calcium: 10.3 mg/dL (ref 8.4–10.5)
Creatinine, Ser: 0.9 mg/dL (ref 0.4–1.2)
Glucose, Bld: 80 mg/dL (ref 70–99)

## 2012-11-09 MED ORDER — AZITHROMYCIN 250 MG PO TABS: ORAL_TABLET | ORAL | Status: DC

## 2012-11-09 NOTE — Patient Instructions (Signed)
Please take all new medication as prescribed - the antibiotic Please continue all other medications as before Please have the pharmacy call with any other refills you may need. Please continue your efforts at being more active, low cholesterol diet, and weight control. You are otherwise up to date with prevention measures today. Please keep your appointments with your specialists as you have planned - wound clinic Please remember to sign up for My Chart if you have not done so, as this will be important to you in the future with finding out test results, communicating by private email, and scheduling acute appointments online when needed. Please return in 6 months, or sooner if needed

## 2012-11-09 NOTE — Assessment & Plan Note (Signed)

## 2012-11-09 NOTE — Assessment & Plan Note (Signed)
Mild to mod, for antibx course,  to f/u any worsening symptoms or concerns 

## 2012-11-09 NOTE — Progress Notes (Signed)
Subjective:    Patient ID: Theresa Hill, female    DOB: 1915-06-15, 79 y.o.   MRN: [id number]  HPI  Here for wellness and f/u;  Overall doing ok;  Pt denies CP, worsening SOB, DOE, wheezing, orthopnea, PND, worsening LE edema, palpitations, dizziness or syncope.  Pt denies neurological change such as new headache, facial or extremity weakness.  Pt denies polydipsia, polyuria, or low sugar symptoms. Pt states overall good compliance with treatment and medications, good tolerability, and has been trying to follow lower cholesterol diet.  Pt denies worsening depressive symptoms, suicidal ideation or panic. No fever, night sweats, wt loss, loss of appetite, or other constitutional symptoms.  Pt states good ability with ADL's, has low fall risk, home safety reviewed and adequate, no other significant changes in hearing or vision, and only occasionally active with exercise. Leg wounds chronic, reasonably closed wounds now, but right with odor now, more painful, better with antibiotic per wound ctr when occurred last yr.  Past Medical History  Diagnosis Date  . HYPOTHYROIDISM 10/11/2006  . VITAMIN B12 DEFICIENCY 10/11/2006  . HYPONATREMIA 07/23/2007  . ANEMIA-NOS 09/14/2007  . HYPERTENSION 10/11/2006  . AORTIC STENOSIS 02/23/2007  . LEFT BUNDLE BRANCH BLOCK 10/11/2006  . CONGESTIVE HEART FAILURE 07/23/2007  . COPD 07/23/2007  . OSTEOPOROSIS 02/23/2007  . OSTEOPENIA 10/11/2006  . SAH (subarachnoid hemorrhage) 09/29/2010  . SDH (subdural hematoma) 09/29/2010  . GERD (gastroesophageal reflux disease) 09/29/2010  . DJD (degenerative joint disease) 09/29/2010   Past Surgical History  Procedure Laterality Date  . Tonsillectomy    . Abdominal hysterectomy    . Cataract surgery    . Appendectomy      reports that she has never smoked. She does not have any smokeless tobacco history on file. She reports that she does not drink alcohol or use illicit drugs. family history includes Cancer in her other; Diabetes in her  other; and Thyroid disease in her maternal grandmother and mother. Allergies  Allergen Reactions  . Cephalexin     REACTION: vomit  . Doxycycline Hyclate     REACTION: vomit  . Sulfamethoxazole W-Trimethoprim    Current Outpatient Prescriptions on File Prior to Visit  Medication Sig Dispense Refill  . albuterol (PROVENTIL HFA;VENTOLIN HFA) 108 (90 BASE) MCG/ACT inhaler Inhale 2 puffs into the lungs every 6 (six) hours as needed for wheezing.  1 Inhaler  11  . alendronate (FOSAMAX) 70 MG tablet take 1 tablet by mouth every week  4 tablet  3  . Aspirin (ADULT ASPIRIN LOW STRENGTH) 81 MG EC tablet Take 81 mg by mouth daily.        Marland Kitchen Bioflavonoid Products (ESTER C PO) Take 1,000 mg by mouth daily.        . calcitRIOL (ROCALTROL) 0.25 MCG capsule take 1 capsule by mouth once daily  90 capsule  3  . calcium carbonate (OS-CAL) 600 MG TABS Take 600 mg by mouth daily.       . fexofenadine (ALLEGRA) 180 MG tablet Take 180 mg by mouth daily.        . furosemide (LASIX) 20 MG tablet take 3 tablets by mouth once daily  90 tablet  11  . levothyroxine (SYNTHROID, LEVOTHROID) 50 MCG tablet Take 50 mcg by mouth daily.      Marland Kitchen levothyroxine (SYNTHROID, LEVOTHROID) 75 MCG tablet take 1 tablet by mouth once daily  90 tablet  3  . Multiple Vitamin (MULITIVITAMIN WITH MINERALS) TABS Take 1 tablet by mouth daily.      Marland Kitchen  omeprazole (PRILOSEC) 20 MG capsule take 1 capsule by mouth once daily  90 capsule  3  . Phenylephrine-Chlorphen-DM (REME TUSSIN DM) 09-14-13 MG/5ML SYRP Take 5 mLs by mouth 3 (three) times daily as needed.  1 Bottle  0  . potassium chloride SA (K-DUR,KLOR-CON) 20 MEQ tablet Take 1 tablet (20 mEq total) by mouth 2 (two) times daily.  60 tablet  12  . traMADol-acetaminophen (ULTRACET) 37.5-325 MG per tablet Take 1 tablet by mouth every 6 (six) hours as needed for pain.  30 tablet  0  . triamcinolone (KENALOG) 0.1 % cream Apply topically 2 (two) times daily.         No current facility-administered  medications on file prior to visit.   Review of Systems Constitutional: Negative for diaphoresis, activity change, appetite change or unexpected weight change.  HENT: Negative for hearing loss, ear pain, facial swelling, mouth sores and neck stiffness.   Eyes: Negative for pain, redness and visual disturbance.  Respiratory: Negative for shortness of breath and wheezing.   Cardiovascular: Negative for chest pain and palpitations.  Gastrointestinal: Negative for diarrhea, blood in stool, abdominal distention or other pain Genitourinary: Negative for hematuria, flank pain or change in urine volume.  Musculoskeletal: Negative for myalgias and joint swelling.  Skin: Negative for color change and wound.  Neurological: Negative for syncope and numbness. other than noted Hematological: Negative for adenopathy.  Psychiatric/Behavioral: Negative for hallucinations, self-injury, decreased concentration and agitation.      Objective:   Physical Exam BP 108/70  Pulse 74  Temp(Src) 98 F (36.7 C) (Oral)  Wt 110 lb (49.896 kg)  BMI 21.48 kg/m2  SpO2 98% VS noted,  Constitutional: Pt is oriented to person, place, and time. Appears frail, thin but positive attitude, only needs minimal assist to up on exam table Head: Normocephalic and atraumatic.  Right Ear: External ear normal.  Left Ear: External ear normal.  Nose: Nose normal.  Mouth/Throat: Oropharynx is clear and moist.  Eyes: Conjunctivae and EOM are normal. Pupils are equal, round, and reactive to light.  Neck: Normal range of motion. Neck supple. No JVD present. No tracheal deviation present.  Cardiovascular: Normal rate, regular rhythm, normal heart sounds and intact distal pulses.   Pulmonary/Chest: Effort normal and breath sounds normal.  Abdominal: Soft. Bowel sounds are normal. There is no tenderness. No HSM  Musculoskeletal: Normal range of motion. Exhibits no edema.  Lymphadenopathy:  Has no cervical adenopathy.  Neurological:  Pt is alert and oriented to person, place, and time. Pt has normal reflexes. No cranial nerve deficit.  Skin: Skin is warm and dry. Did not remove unna boot type bandaging to both legs below knee Psychiatric:  Has  normal mood and affect. Behavior is normal.     Assessment & Plan:

## 2012-11-23 ENCOUNTER — Encounter (HOSPITAL_BASED_OUTPATIENT_CLINIC_OR_DEPARTMENT_OTHER): Payer: Medicare Other | Attending: General Surgery

## 2012-11-23 DIAGNOSIS — L97909 Non-pressure chronic ulcer of unspecified part of unspecified lower leg with unspecified severity: Secondary | ICD-10-CM | POA: Insufficient documentation

## 2012-12-14 ENCOUNTER — Encounter (HOSPITAL_BASED_OUTPATIENT_CLINIC_OR_DEPARTMENT_OTHER): Payer: Medicare Other | Attending: General Surgery

## 2012-12-14 DIAGNOSIS — L97909 Non-pressure chronic ulcer of unspecified part of unspecified lower leg with unspecified severity: Secondary | ICD-10-CM | POA: Insufficient documentation

## 2012-12-14 DIAGNOSIS — L97809 Non-pressure chronic ulcer of other part of unspecified lower leg with unspecified severity: Secondary | ICD-10-CM | POA: Insufficient documentation

## 2013-01-10 ENCOUNTER — Other Ambulatory Visit: Payer: Self-pay | Admitting: Internal Medicine

## 2013-01-16 ENCOUNTER — Other Ambulatory Visit: Payer: Self-pay | Admitting: Internal Medicine

## 2013-01-16 NOTE — Telephone Encounter (Signed)
Done hardcopy to robin  

## 2013-01-16 NOTE — Telephone Encounter (Signed)
Faxed hardcopy to [facility] [address] [place] 

## 2013-01-25 ENCOUNTER — Ambulatory Visit (INDEPENDENT_AMBULATORY_CARE_PROVIDER_SITE_OTHER): Payer: Medicare Other

## 2013-01-25 ENCOUNTER — Encounter (HOSPITAL_BASED_OUTPATIENT_CLINIC_OR_DEPARTMENT_OTHER): Payer: Medicare Other | Attending: General Surgery

## 2013-01-25 DIAGNOSIS — Z23 Encounter for immunization: Secondary | ICD-10-CM

## 2013-01-25 DIAGNOSIS — I87319 Chronic venous hypertension (idiopathic) with ulcer of unspecified lower extremity: Secondary | ICD-10-CM | POA: Insufficient documentation

## 2013-01-25 DIAGNOSIS — L97909 Non-pressure chronic ulcer of unspecified part of unspecified lower leg with unspecified severity: Secondary | ICD-10-CM | POA: Insufficient documentation

## 2013-02-22 ENCOUNTER — Encounter (HOSPITAL_BASED_OUTPATIENT_CLINIC_OR_DEPARTMENT_OTHER): Payer: Medicare Other | Attending: General Surgery

## 2013-02-22 DIAGNOSIS — I87309 Chronic venous hypertension (idiopathic) without complications of unspecified lower extremity: Secondary | ICD-10-CM | POA: Insufficient documentation

## 2013-02-22 DIAGNOSIS — L97809 Non-pressure chronic ulcer of other part of unspecified lower leg with unspecified severity: Secondary | ICD-10-CM | POA: Insufficient documentation

## 2013-02-22 DIAGNOSIS — I872 Venous insufficiency (chronic) (peripheral): Secondary | ICD-10-CM | POA: Insufficient documentation

## 2013-03-01 ENCOUNTER — Other Ambulatory Visit: Payer: Self-pay | Admitting: Cardiology

## 2013-03-01 ENCOUNTER — Other Ambulatory Visit: Payer: Self-pay | Admitting: Internal Medicine

## 2013-03-12 ENCOUNTER — Other Ambulatory Visit: Payer: Self-pay | Admitting: Internal Medicine

## 2013-03-22 ENCOUNTER — Encounter (HOSPITAL_BASED_OUTPATIENT_CLINIC_OR_DEPARTMENT_OTHER): Payer: Medicare Other | Attending: General Surgery

## 2013-03-22 DIAGNOSIS — I87319 Chronic venous hypertension (idiopathic) with ulcer of unspecified lower extremity: Secondary | ICD-10-CM | POA: Insufficient documentation

## 2013-03-22 DIAGNOSIS — L97909 Non-pressure chronic ulcer of unspecified part of unspecified lower leg with unspecified severity: Secondary | ICD-10-CM | POA: Insufficient documentation

## 2013-04-07 ENCOUNTER — Ambulatory Visit (INDEPENDENT_AMBULATORY_CARE_PROVIDER_SITE_OTHER): Payer: Medicare Other | Admitting: Family Medicine

## 2013-04-07 ENCOUNTER — Encounter (HOSPITAL_COMMUNITY): Payer: Self-pay | Admitting: Emergency Medicine

## 2013-04-07 ENCOUNTER — Emergency Department (HOSPITAL_COMMUNITY)
Admission: EM | Admit: 2013-04-07 | Discharge: 2013-04-07 | Disposition: A | Discharge status: Home / Self Care | Payer: Medicare Other | Attending: Emergency Medicine | Admitting: Emergency Medicine

## 2013-04-07 VITALS — BP 128/82 | HR 78 | Temp 97.9°F | Resp 18

## 2013-04-07 DIAGNOSIS — Z8639 Personal history of other endocrine, nutritional and metabolic disease: Secondary | ICD-10-CM | POA: Insufficient documentation

## 2013-04-07 DIAGNOSIS — Z79899 Other long term (current) drug therapy: Secondary | ICD-10-CM | POA: Insufficient documentation

## 2013-04-07 DIAGNOSIS — M1990 Unspecified osteoarthritis, unspecified site: Secondary | ICD-10-CM | POA: Insufficient documentation

## 2013-04-07 DIAGNOSIS — K219 Gastro-esophageal reflux disease without esophagitis: Secondary | ICD-10-CM | POA: Insufficient documentation

## 2013-04-07 DIAGNOSIS — I10 Essential (primary) hypertension: Secondary | ICD-10-CM | POA: Insufficient documentation

## 2013-04-07 DIAGNOSIS — E039 Hypothyroidism, unspecified: Secondary | ICD-10-CM | POA: Insufficient documentation

## 2013-04-07 DIAGNOSIS — J4489 Other specified chronic obstructive pulmonary disease: Secondary | ICD-10-CM | POA: Insufficient documentation

## 2013-04-07 DIAGNOSIS — J449 Chronic obstructive pulmonary disease, unspecified: Secondary | ICD-10-CM | POA: Insufficient documentation

## 2013-04-07 DIAGNOSIS — R040 Epistaxis: Secondary | ICD-10-CM

## 2013-04-07 DIAGNOSIS — D649 Anemia, unspecified: Secondary | ICD-10-CM

## 2013-04-07 DIAGNOSIS — I509 Heart failure, unspecified: Secondary | ICD-10-CM | POA: Insufficient documentation

## 2013-04-07 DIAGNOSIS — M810 Age-related osteoporosis without current pathological fracture: Secondary | ICD-10-CM | POA: Insufficient documentation

## 2013-04-07 DIAGNOSIS — M899 Disorder of bone, unspecified: Secondary | ICD-10-CM | POA: Insufficient documentation

## 2013-04-07 DIAGNOSIS — Z862 Personal history of diseases of the blood and blood-forming organs and certain disorders involving the immune mechanism: Secondary | ICD-10-CM | POA: Insufficient documentation

## 2013-04-07 LAB — POCT CBC
Granulocyte percent: 75 %G (ref 37–80)
HCT, POC: 33.8 % — AB (ref 37.7–47.9)
Hemoglobin: 10.5 g/dL — AB (ref 12.2–16.2)
Lymph, poc: 1.7 (ref 0.6–3.4)
MCH, POC: 32.5 pg — AB (ref 27–31.2)
MCHC: 31.1 g/dL — AB (ref 31.8–35.4)
MCV: 104.7 fL — AB (ref 80–97)
MID (cbc): 0.5 (ref 0–0.9)
MPV: 9.6 fL (ref 0–99.8)
POC Granulocyte: 6.4 (ref 2–6.9)
POC LYMPH PERCENT: 19.7 % (ref 10–50)
POC MID %: 5.3 %M (ref 0–12)
Platelet Count, POC: 280 10*3/uL (ref 142–424)
RBC: 3.23 M/uL — AB (ref 4.04–5.48)
RDW, POC: 17 %
WBC: 8.5 10*3/uL (ref 4.6–10.2)

## 2013-04-07 MED ORDER — OXYMETAZOLINE HCL 0.05 % NA SOLN
1.0000 | Freq: Once | NASAL | Status: AC
  Administered 2013-04-07: 1 via NASAL
  Filled 2013-04-07: qty 15

## 2013-04-07 NOTE — ED Provider Notes (Signed)
CSN: [id number]     Arrival date & time 04/07/13  1833 History   First MD Initiated Contact with Patient 04/07/13 1849     Chief Complaint  Patient presents with  . Epistaxis    HPI  Patient presents with family. Her nose started bleeding early this morning. Seen and had it cauterized at urgent care. No hemostasis. Referred here. History significant nosebleeds. Takes aspirin only. No other anti-coagulation. No pain. No sinus symptoms. No runny nose or allergy symptoms.  Past Medical History  Diagnosis Date  . HYPOTHYROIDISM 10/11/2006  . VITAMIN B12 DEFICIENCY 10/11/2006  . HYPONATREMIA 07/23/2007  . ANEMIA-NOS 09/14/2007  . HYPERTENSION 10/11/2006  . AORTIC STENOSIS 02/23/2007  . LEFT BUNDLE BRANCH BLOCK 10/11/2006  . CONGESTIVE HEART FAILURE 07/23/2007  . COPD 07/23/2007  . OSTEOPOROSIS 02/23/2007  . OSTEOPENIA 10/11/2006  . SAH (subarachnoid hemorrhage) 09/29/2010  . SDH (subdural hematoma) 09/29/2010  . GERD (gastroesophageal reflux disease) 09/29/2010  . DJD (degenerative joint disease) 09/29/2010   Past Surgical History  Procedure Laterality Date  . Tonsillectomy    . Abdominal hysterectomy    . Cataract surgery    . Appendectomy     Family History  Problem Relation Age of Onset  . Thyroid disease Mother   . Thyroid disease Maternal Grandmother   . Cancer Other     breast  . Diabetes Other    History  Substance Use Topics  . Smoking status: Never Smoker   . Smokeless tobacco: Not on file  . Alcohol Use: No   OB History   Grav Para Term Preterm Abortions TAB SAB Ect Mult Living                 Review of Systems  Constitutional: Negative for fever, chills, diaphoresis, appetite change and fatigue.  HENT: Positive for nosebleeds. Negative for mouth sores, sore throat and trouble swallowing.   Eyes: Negative for visual disturbance.  Respiratory: Negative for cough, chest tightness, shortness of breath and wheezing.   Cardiovascular: Negative for chest pain.   Gastrointestinal: Negative for nausea, vomiting, abdominal pain, diarrhea and abdominal distention.  Endocrine: Negative for polydipsia, polyphagia and polyuria.  Genitourinary: Negative for dysuria, frequency and hematuria.  Musculoskeletal: Negative for gait problem.  Skin: Negative for color change, pallor and rash.  Neurological: Negative for dizziness, syncope, light-headedness and headaches.  Hematological: Does not bruise/bleed easily.  Psychiatric/Behavioral: Negative for behavioral problems and confusion.    Allergies  Cephalexin; Doxycycline hyclate; and Sulfamethoxazole-trimethoprim  Home Medications   Current Outpatient Rx  Name  Route  Sig  Dispense  Refill  . albuterol (PROVENTIL HFA;VENTOLIN HFA) 108 (90 BASE) MCG/ACT inhaler   Inhalation   Inhale 2 puffs into the lungs every 6 (six) hours as needed for wheezing.   1 Inhaler   11   . alendronate (FOSAMAX) 70 MG tablet   Oral   Take 70 mg by mouth once a week. Take with a full glass of water on an empty stomach. Takes on sundays         . Aspirin (ADULT ASPIRIN LOW STRENGTH) 81 MG EC tablet   Oral   Take 81 mg by mouth daily.           Marland Kitchen Bioflavonoid Products (ESTER C PO)   Oral   Take 1,000 mg by mouth daily.           . calcitRIOL (ROCALTROL) 0.25 MCG capsule   Oral   Take 0.25 mcg by mouth daily.         Marland Kitchen  calcium carbonate (OS-CAL) 600 MG TABS   Oral   Take 600 mg by mouth daily.          . fexofenadine (ALLEGRA) 180 MG tablet   Oral   Take 180 mg by mouth daily.           . furosemide (LASIX) 20 MG tablet   Oral   Take 60 mg by mouth daily.         . influenza vac split quadrivalent PF (FLUARIX) 0.5 ML injection   Intramuscular   Inject 0.5 mLs into the muscle once.         Marland Kitchen levothyroxine (SYNTHROID, LEVOTHROID) 75 MCG tablet   Oral   Take 75 mcg by mouth daily before breakfast.         . Multiple Vitamin (MULITIVITAMIN WITH MINERALS) TABS   Oral   Take 1 tablet by  mouth daily.         Marland Kitchen omeprazole (PRILOSEC) 20 MG capsule   Oral   Take 20 mg by mouth daily.         . potassium chloride SA (K-DUR,KLOR-CON) 20 MEQ tablet   Oral   Take 20 mEq by mouth 2 (two) times daily.         . traMADol-acetaminophen (ULTRACET) 37.5-325 MG per tablet      take 1 tablet by mouth every 6 hours if needed for pain   120 tablet   1   . triamcinolone (KENALOG) 0.1 % cream   Topical   Apply topically 2 (two) times daily.            BP 103/56  Pulse 70  Temp(Src) 98.2 F (36.8 C) (Oral)  Resp 18  SpO2 98% Physical Exam  Constitutional: She is oriented to person, place, and time. She appears well-developed and well-nourished. No distress.  Elderly female. Awake alert. No acute distress. Bloody gauze taped to her left nose  HENT:  Head: Normocephalic.  Gauze is removed .  I had her blow a large clot from the left naris. Small area of cauterized mucosa noted on the anterior medial septum.  No dominant vessels.  Eyes: Conjunctivae are normal. Pupils are equal, round, and reactive to light. No scleral icterus.  Neck: Normal range of motion. Neck supple. No thyromegaly present.  Cardiovascular: Normal rate and regular rhythm.  Exam reveals no gallop and no friction rub.   No murmur heard. Pulmonary/Chest: Effort normal and breath sounds normal. No respiratory distress. She has no wheezes. She has no rales.  Abdominal: Soft. Bowel sounds are normal. She exhibits no distension. There is no tenderness. There is no rebound.  Musculoskeletal: Normal range of motion.  Neurological: She is alert and oriented to person, place, and time.  Skin: Skin is warm and dry. No rash noted.  Psychiatric: She has a normal mood and affect. Her behavior is normal.    ED Course  Procedures (including critical care time) Labs Review Labs Reviewed - No data to display Imaging Review No results found.  EKG Interpretation   None       MDM   1. Epistaxis    Nose  was packed with 2 side-by-side anterior Merocel sponges. No residual bleeding and recheck in 15 minutes at 1 hour. Transient T. referral for cautery.    Roney Marion, MD 04/07/13 2132

## 2013-04-07 NOTE — ED Notes (Signed)
Family reported Pt nose bleed started at 0930 . The bleeding stopped for a short time but then continue to bleed . Family took PT to [facility] for treatment. Pt was sent to [facility] for further treatment. On arrival to [facility] PT had a small amount of bleeding present .

## 2013-04-07 NOTE — Progress Notes (Signed)
[facility]:  Office Visit  Chief Complaint:  Chief Complaint  Patient presents with  . Epistaxis    1 hour    HPI: Theresa Hill is a 79 y.o. female who is here for  Left nostril epistaxis, she has had this recurrently off and on throughout the years. They usually stop with cold packs and picnhing nose.  No fevers, chills, + Central heat. She is on ASA 81 mg , does have a  h/o AS and also subdural bleed and SAH.  She did not take nay of her meds today.  Again tried cold packs, has been going on for 1 hours.  Past Medical History  Diagnosis Date  . HYPOTHYROIDISM 10/11/2006  . VITAMIN B12 DEFICIENCY 10/11/2006  . HYPONATREMIA 07/23/2007  . ANEMIA-NOS 09/14/2007  . HYPERTENSION 10/11/2006  . AORTIC STENOSIS 02/23/2007  . LEFT BUNDLE BRANCH BLOCK 10/11/2006  . CONGESTIVE HEART FAILURE 07/23/2007  . COPD 07/23/2007  . OSTEOPOROSIS 02/23/2007  . OSTEOPENIA 10/11/2006  . SAH (subarachnoid hemorrhage) 09/29/2010  . SDH (subdural hematoma) 09/29/2010  . GERD (gastroesophageal reflux disease) 09/29/2010  . DJD (degenerative joint disease) 09/29/2010   Past Surgical History  Procedure Laterality Date  . Tonsillectomy    . Abdominal hysterectomy    . Cataract surgery    . Appendectomy     History   Social History  . Marital Status: Widowed    Spouse Name: N/A    Number of Children: 1  . Years of Education: N/A   Occupational History  .     Social History Main Topics  . Smoking status: Never Smoker   . Smokeless tobacco: None  . Alcohol Use: No  . Drug Use: No  . Sexual Activity: None   Other Topics Concern  . None   Social History Narrative   Lives with daughter.   Family History  Problem Relation Age of Onset  . Thyroid disease Mother   . Thyroid disease Maternal Grandmother   . Cancer Other     breast  . Diabetes Other    Allergies  Allergen Reactions  . Cephalexin     REACTION: vomit  . Doxycycline Hyclate     REACTION: vomit  .  Sulfamethoxazole-Trimethoprim    Prior to Admission medications   Medication Sig Start Date End Date Taking? Authorizing Provider  albuterol (PROVENTIL HFA;VENTOLIN HFA) 108 (90 BASE) MCG/ACT inhaler Inhale 2 puffs into the lungs every 6 (six) hours as needed for wheezing. 06/26/12  Yes Corwin Levins, MD  alendronate (FOSAMAX) 70 MG tablet take 1 tablet by mouth every week 01/10/13  Yes Corwin Levins, MD  Aspirin (ADULT ASPIRIN LOW STRENGTH) 81 MG EC tablet Take 81 mg by mouth daily.     Yes Historical Provider, MD  calcitRIOL (ROCALTROL) 0.25 MCG capsule take 1 capsule by mouth once daily 03/01/13  Yes Corwin Levins, MD  calcium carbonate (OS-CAL) 600 MG TABS Take 600 mg by mouth daily.    Yes Historical Provider, MD  fexofenadine (ALLEGRA) 180 MG tablet Take 180 mg by mouth daily.     Yes Historical Provider, MD  furosemide (LASIX) 20 MG tablet take 3 tablets by mouth once daily 03/12/13  Yes Corwin Levins, MD  levothyroxine Erline Levine, LEVOTHROID) 75 MCG tablet take 1 tablet by mouth once daily 08/20/12  Yes Corwin Levins, MD  Multiple Vitamin (MULITIVITAMIN WITH MINERALS) TABS Take 1 tablet by mouth daily.   Yes Historical Provider, MD  omeprazole (PRILOSEC) 20 MG capsule take 1 capsule by mouth once daily 10/19/12  Yes Corwin Levins, MD  potassium chloride SA (K-DUR,KLOR-CON) 20 MEQ tablet take 1 tablet by mouth twice a day 03/01/13  Yes Lewayne Bunting, MD  Bioflavonoid Products (ESTER C PO) Take 1,000 mg by mouth daily.      Historical Provider, MD  levothyroxine (SYNTHROID, LEVOTHROID) 50 MCG tablet Take 50 mcg by mouth daily.    Historical Provider, MD  Phenylephrine-Chlorphen-DM (REME TUSSIN DM) 09-14-13 MG/5ML SYRP Take 5 mLs by mouth 3 (three) times daily as needed. 07/26/12   Nicki Reaper, NP  traMADol-acetaminophen (ULTRACET) 37.5-325 MG per tablet take 1 tablet by mouth every 6 hours if needed for pain 01/16/13   Corwin Levins, MD  triamcinolone (KENALOG) 0.1 % cream Apply topically 2 (two) times  daily.      Historical Provider, MD     ROS: The patient denies fevers, chills, night sweats, unintentional weight loss, chest pain, palpitations, wheezing, dyspnea on exertion, nausea, vomiting, abdominal pain, dysuria, hematuria, melena  All other systems have been reviewed and were otherwise negative with the exception of those mentioned in the HPI and as above.    PHYSICAL EXAM: Filed Vitals:   04/07/13 1432  BP: 128/82  Pulse: 78  Temp: 97.9 F (36.6 C)  Resp: 18   There were no vitals filed for this visit. There is no weight on file to calculate BMI.  General: Alert, no acute distress, frail elderly female HEENT:  Normocephalic, atraumatic, oropharynx patent after clearing of clots. EOMI, + left cataract. + left nasal epistaxis, clots, Cardiovascular:  Regular rate and rhythm, no rubs+ harsh systolic murmur or gallops.  No Carotid bruits, radial pulse intact. No pedal edema.  Respiratory: Clear to auscultation bilaterally.  No wheezes, rales, or rhonchi.  No cyanosis, no use of accessory musculature GI: No organomegaly, abdomen is soft and non-tender, positive bowel sounds.  No masses. Skin: No rashes. Neurologic: Facial musculature symmetric. Psychiatric: Patient is appropriate throughout our interaction. Lymphatic: No cervical lymphadenopathy Musculoskeletal: Wheelchair use   LABS: Results for orders placed in visit on 04/07/13  POCT CBC      Result Value Range   WBC 8.5  4.6 - 10.2 K/uL   Lymph, poc 1.7  0.6 - 3.4   POC LYMPH PERCENT 19.7  10 - 50 %L   MID (cbc) 0.5  0 - 0.9   POC MID % 5.3  0 - 12 %M   POC Granulocyte 6.4  2 - 6.9   Granulocyte percent 75.0  37 - 80 %G   RBC 3.23 (*) 4.04 - 5.48 M/uL   Hemoglobin 10.5 (*) 12.2 - 16.2 g/dL   HCT, POC 16.1 (*) 09.6 - 47.9 %   MCV 104.7 (*) 80 - 97 fL   MCH, POC 32.5 (*) 27 - 31.2 pg   MCHC 31.1 (*) 31.8 - 35.4 g/dL   RDW, POC 04.5     Platelet Count, POC 280  142 - 424 K/uL   MPV 9.6  0 - 99.8 fL      EKG/XRAY:   Primary read interpreted by Dr. Conley Rolls at [facility].   ASSESSMENT/PLAN: Encounter Diagnoses  Name Primary?  . Epistaxis Yes  . Anemia    [name] 79 y/o female with persistent epistaxis Hgb and VSS Attempted to cauterize with silver nitrate, lidocaine with epi packing and also packing with rapid rhino x 5  Able to stop bleeding for about 10 minutes  then restarted She has been here at [facility] for 4 hours and we are unable to get bleeding to stop, I will have to send her to ER for further eval hopefully by ENT.  Daughter will take mom and understands, called Charge Nurse to inform that pt will be coming by private transport  Gross sideeffects, risk and benefits, and alternatives of medications d/w patient. Patient is aware that all medications have potential sideeffects and we are unable to predict every sideeffect or drug-drug interaction that may occur.  Hamilton Capri PHUONG, DO 04/07/2013 6:19 PM

## 2013-04-07 NOTE — ED Notes (Signed)
Family states understanding of discharge instructions 

## 2013-04-07 NOTE — ED Notes (Signed)
MD at bedside. EDP James at bed side to eval PT.

## 2013-04-08 ENCOUNTER — Encounter (HOSPITAL_COMMUNITY): Payer: Self-pay | Admitting: Emergency Medicine

## 2013-04-08 ENCOUNTER — Emergency Department (HOSPITAL_COMMUNITY)
Admission: EM | Admit: 2013-04-08 | Discharge: 2013-04-08 | Disposition: A | Discharge status: Home / Self Care | Payer: Medicare Other | Attending: Emergency Medicine | Admitting: Emergency Medicine

## 2013-04-08 DIAGNOSIS — J449 Chronic obstructive pulmonary disease, unspecified: Secondary | ICD-10-CM | POA: Insufficient documentation

## 2013-04-08 DIAGNOSIS — Z79899 Other long term (current) drug therapy: Secondary | ICD-10-CM | POA: Insufficient documentation

## 2013-04-08 DIAGNOSIS — I509 Heart failure, unspecified: Secondary | ICD-10-CM | POA: Insufficient documentation

## 2013-04-08 DIAGNOSIS — IMO0002 Reserved for concepts with insufficient information to code with codable children: Secondary | ICD-10-CM | POA: Insufficient documentation

## 2013-04-08 DIAGNOSIS — Z8739 Personal history of other diseases of the musculoskeletal system and connective tissue: Secondary | ICD-10-CM | POA: Insufficient documentation

## 2013-04-08 DIAGNOSIS — K219 Gastro-esophageal reflux disease without esophagitis: Secondary | ICD-10-CM | POA: Insufficient documentation

## 2013-04-08 DIAGNOSIS — Z862 Personal history of diseases of the blood and blood-forming organs and certain disorders involving the immune mechanism: Secondary | ICD-10-CM | POA: Insufficient documentation

## 2013-04-08 DIAGNOSIS — I10 Essential (primary) hypertension: Secondary | ICD-10-CM | POA: Insufficient documentation

## 2013-04-08 DIAGNOSIS — J4489 Other specified chronic obstructive pulmonary disease: Secondary | ICD-10-CM | POA: Insufficient documentation

## 2013-04-08 DIAGNOSIS — Z8669 Personal history of other diseases of the nervous system and sense organs: Secondary | ICD-10-CM | POA: Insufficient documentation

## 2013-04-08 DIAGNOSIS — E039 Hypothyroidism, unspecified: Secondary | ICD-10-CM | POA: Insufficient documentation

## 2013-04-08 DIAGNOSIS — Z7982 Long term (current) use of aspirin: Secondary | ICD-10-CM | POA: Insufficient documentation

## 2013-04-08 DIAGNOSIS — R040 Epistaxis: Secondary | ICD-10-CM | POA: Insufficient documentation

## 2013-04-08 NOTE — ED Provider Notes (Signed)
CSN: [id number]     Arrival date & time 04/08/13  0037 History   First MD Initiated Contact with Patient 04/08/13 [phone]     Chief Complaint  Patient presents with  . Epistaxis   (Consider location/radiation/quality/duration/timing/severity/associated sxs/prior Treatment) HPI Comments: Pt comes in with cc of epistaxis. Seen y'day for the same, packed anteriorly, and discharged at 9:30 pm. Pt started spitting out blood around midnight, and swallowing blood, so she returned to the ED. She has no active bleeding during my evaluation and feel better. Not on anticoagulation.  Patient is a 79 y.o. female presenting with nosebleeds. The history is provided by the patient and a relative.  Epistaxis   Past Medical History  Diagnosis Date  . HYPOTHYROIDISM 10/11/2006  . VITAMIN B12 DEFICIENCY 10/11/2006  . HYPONATREMIA 07/23/2007  . ANEMIA-NOS 09/14/2007  . HYPERTENSION 10/11/2006  . AORTIC STENOSIS 02/23/2007  . LEFT BUNDLE BRANCH BLOCK 10/11/2006  . CONGESTIVE HEART FAILURE 07/23/2007  . COPD 07/23/2007  . OSTEOPOROSIS 02/23/2007  . OSTEOPENIA 10/11/2006  . SAH (subarachnoid hemorrhage) 09/29/2010  . SDH (subdural hematoma) 09/29/2010  . GERD (gastroesophageal reflux disease) 09/29/2010  . DJD (degenerative joint disease) 09/29/2010   Past Surgical History  Procedure Laterality Date  . Tonsillectomy    . Abdominal hysterectomy    . Cataract surgery    . Appendectomy     Family History  Problem Relation Age of Onset  . Thyroid disease Mother   . Thyroid disease Maternal Grandmother   . Cancer Other     breast  . Diabetes Other    History  Substance Use Topics  . Smoking status: Never Smoker   . Smokeless tobacco: Not on file  . Alcohol Use: No   OB History   Grav Para Term Preterm Abortions TAB SAB Ect Mult Living                 Review of Systems  Constitutional: Negative for activity change.  HENT: Positive for nosebleeds.   Respiratory: Negative for shortness of breath.    Cardiovascular: Negative for chest pain.    Allergies  Cephalexin; Doxycycline hyclate; and Sulfamethoxazole-trimethoprim  Home Medications   Current Outpatient Rx  Name  Route  Sig  Dispense  Refill  . albuterol (PROVENTIL HFA;VENTOLIN HFA) 108 (90 BASE) MCG/ACT inhaler   Inhalation   Inhale 2 puffs into the lungs every 6 (six) hours as needed for wheezing.   1 Inhaler   11   . alendronate (FOSAMAX) 70 MG tablet   Oral   Take 70 mg by mouth once a week. Take with a full glass of water on an empty stomach. Takes on sundays         . Aspirin (ADULT ASPIRIN LOW STRENGTH) 81 MG EC tablet   Oral   Take 81 mg by mouth daily.          Marland Kitchen Bioflavonoid Products (ESTER C PO)   Oral   Take 1,000 mg by mouth daily.          . calcitRIOL (ROCALTROL) 0.25 MCG capsule   Oral   Take 0.25 mcg by mouth daily.         . calcium carbonate (OS-CAL) 600 MG TABS   Oral   Take 600 mg by mouth daily.          . fexofenadine (ALLEGRA) 180 MG tablet   Oral   Take 180 mg by mouth daily.           Marland Kitchen  furosemide (LASIX) 20 MG tablet   Oral   Take 60 mg by mouth daily.         Marland Kitchen levothyroxine (SYNTHROID, LEVOTHROID) 75 MCG tablet   Oral   Take 75 mcg by mouth daily before breakfast.         . Multiple Vitamin (MULITIVITAMIN WITH MINERALS) TABS   Oral   Take 1 tablet by mouth daily.         Marland Kitchen omeprazole (PRILOSEC) 20 MG capsule   Oral   Take 20 mg by mouth daily.         . potassium chloride SA (K-DUR,KLOR-CON) 20 MEQ tablet   Oral   Take 20 mEq by mouth 2 (two) times daily.         . traMADol-acetaminophen (ULTRACET) 37.5-325 MG per tablet   Oral   Take 1 tablet by mouth every 6 (six) hours as needed for moderate pain.         Marland Kitchen triamcinolone cream (KENALOG) 0.1 %   Topical   Apply 1 application topically 2 (two) times daily.          BP 109/54  Pulse 64  Temp(Src) 98.2 F (36.8 C) (Oral)  Resp 16  Wt 107 lb (48.535 kg)  SpO2 99% Physical Exam   Nursing note and vitals reviewed. Constitutional: She appears well-developed.  HENT:  Head: Normocephalic.  Eyes: Conjunctivae are normal.  Neck: Normal range of motion. Neck supple.  Left nare packed, no posterior pharynx bleeding  Cardiovascular: Normal rate and regular rhythm.   Pulmonary/Chest: Effort normal.  Abdominal: Soft.    ED Course  Procedures (including critical care time) Labs Review Labs Reviewed - No data to display Imaging Review No results found.  EKG Interpretation   None       MDM   1. Epistaxis    Pt comes in with cc of epistaxis. Just packed last night, and started rebleeding. She has no active bleed at this time. I spoke with Dr. Pollyann Kennedy, who was in the ER to evaluate a different patient, and he asked patient's family to call and set up and appointment for today  - office opens at 9am.  We conveyed this message to the family. They live far away from the Hospital, and have decided to rest in the ED until 9 am.  Will order AM meds.  Derwood Kaplan, MD 04/08/13 [phone]

## 2013-04-08 NOTE — ED Notes (Addendum)
D/c at 9:45pm on 11/23 with left nostril packing and epitaxis, while at home began to feel blood trickling down throat, came back to ER. No bleeding at this time.  FAmily is upset that pt was not admitted.

## 2013-04-09 ENCOUNTER — Encounter: Payer: Self-pay | Admitting: Cardiology

## 2013-04-09 ENCOUNTER — Ambulatory Visit (INDEPENDENT_AMBULATORY_CARE_PROVIDER_SITE_OTHER): Payer: Medicare Other | Admitting: Cardiology

## 2013-04-09 VITALS — BP 108/64 | HR 70 | Ht 60.0 in | Wt 105.0 lb

## 2013-04-09 DIAGNOSIS — I359 Nonrheumatic aortic valve disorder, unspecified: Secondary | ICD-10-CM

## 2013-04-09 DIAGNOSIS — I509 Heart failure, unspecified: Secondary | ICD-10-CM

## 2013-04-09 LAB — BASIC METABOLIC PANEL
Calcium: 9.4 mg/dL (ref 8.4–10.5)
Chloride: 101 mEq/L (ref 96–112)
Creatinine, Ser: 1.1 mg/dL (ref 0.4–1.2)
GFR: 51.75 mL/min — ABNORMAL LOW (ref >60.00)
Sodium: 137 mEq/L (ref 135–145)

## 2013-04-09 NOTE — Progress Notes (Signed)
HPI: Pleasant female for fu of AS. Echocardiogram in June 2012 showed an ejection fraction of 40%, severe aortic stenosis with a mean gradient of 43 mmHg, mild AI, moderate mitral regurgitation, moderate left atrial enlargement, mild right atrial and right ventricular enlargement. When I saw her previously I explained that she was not a candidate for aortic valve replacement and she and her daughter were also in agreement with that. Did not want to pursue TAVR. She is a no CODE BLUE and we are treating medically and not pursuing followup echocardiograms. Since last saw her in May 2014, she notes some dyspnea on exertion but unchanged. No orthopnea or PND. She continues to have chronic pedal edema. No chest pain or syncope. Had recent significant nosebleed requiring attention in the emergency room.   Current Outpatient Prescriptions  Medication Sig Dispense Refill  . albuterol (PROVENTIL HFA;VENTOLIN HFA) 108 (90 BASE) MCG/ACT inhaler Inhale 2 puffs into the lungs every 6 (six) hours as needed for wheezing.  1 Inhaler  11  . alendronate (FOSAMAX) 70 MG tablet Take 70 mg by mouth once a week. Take with a full glass of water on an empty stomach. Takes on sundays      . Aspirin (ADULT ASPIRIN LOW STRENGTH) 81 MG EC tablet Take 81 mg by mouth daily.       Marland Kitchen Bioflavonoid Products (ESTER C PO) Take 1,000 mg by mouth daily.       . calcitRIOL (ROCALTROL) 0.25 MCG capsule Take 0.25 mcg by mouth daily.      . calcium carbonate (OS-CAL) 600 MG TABS Take 600 mg by mouth daily.       . fexofenadine (ALLEGRA) 180 MG tablet Take 180 mg by mouth daily.        . furosemide (LASIX) 20 MG tablet Take 60 mg by mouth daily.      Marland Kitchen levothyroxine (SYNTHROID, LEVOTHROID) 75 MCG tablet Take 75 mcg by mouth daily before breakfast.      . Multiple Vitamin (MULITIVITAMIN WITH MINERALS) TABS Take 1 tablet by mouth daily.      Marland Kitchen omeprazole (PRILOSEC) 20 MG capsule Take 20 mg by mouth daily.      . potassium chloride SA  (K-DUR,KLOR-CON) 20 MEQ tablet Take 20 mEq by mouth 2 (two) times daily.      . traMADol-acetaminophen (ULTRACET) 37.5-325 MG per tablet Take 1 tablet by mouth every 6 (six) hours as needed for moderate pain.      Marland Kitchen triamcinolone cream (KENALOG) 0.1 % Apply 1 application topically 2 (two) times daily.       No current facility-administered medications for this visit.     Past Medical History  Diagnosis Date  . HYPOTHYROIDISM 10/11/2006  . VITAMIN B12 DEFICIENCY 10/11/2006  . HYPONATREMIA 07/23/2007  . ANEMIA-NOS 09/14/2007  . HYPERTENSION 10/11/2006  . AORTIC STENOSIS 02/23/2007  . LEFT BUNDLE BRANCH BLOCK 10/11/2006  . CONGESTIVE HEART FAILURE 07/23/2007  . COPD 07/23/2007  . OSTEOPOROSIS 02/23/2007  . OSTEOPENIA 10/11/2006  . SAH (subarachnoid hemorrhage) 09/29/2010  . SDH (subdural hematoma) 09/29/2010  . GERD (gastroesophageal reflux disease) 09/29/2010  . DJD (degenerative joint disease) 09/29/2010    Past Surgical History  Procedure Laterality Date  . Tonsillectomy    . Abdominal hysterectomy    . Cataract surgery    . Appendectomy      History   Social History  . Marital Status: Widowed    Spouse Name: N/A    Number of Children:  1  . Years of Education: N/A   Occupational History  .     Social History Main Topics  . Smoking status: Never Smoker   . Smokeless tobacco: Not on file  . Alcohol Use: No  . Drug Use: No  . Sexual Activity: Not on file   Other Topics Concern  . Not on file   Social History Narrative   Lives with daughter.    ROS: no fevers or chills, productive cough, hemoptysis, dysphasia, odynophagia, melena, hematochezia, dysuria, hematuria, rash, seizure activity, orthopnea, PND, claudication. Remaining systems are negative.  Physical Exam: Well-developed frail in no acute distress.  Skin is warm and dry.  HEENT left nares packed due to recent nosebleed Neck is supple.  Chest is clear to auscultation with normal expansion.  Cardiovascular exam is  regular rate and rhythm. 3/6 systolic murmur left sternal border. S2 is diminished Abdominal exam nontender or distended. No masses palpated. Extremities wrapped and show 1+ edema. neuro grossly intact  ECG sinus rhythm at a rate of 70. Left ventricular hypertrophy with QRS widening.

## 2013-04-09 NOTE — Assessment & Plan Note (Signed)
The patient has severe aortic stenosis. Given her age and overall medical condition she is not a candidate for valve replacement. She does not want to consider other procedures such as TAVR and also would most likely not be a candidate. They understand this will progress and will ultimately take her life. Conservative measures only. Continue Lasix at present dose. Take additional 40 mg daily PRN increased dyspnea or edema. No plans to repeat echocardiogram. Patient is a no CODE BLUE including no defibrillation, CPR or intubation. I have not added an ACE inhibitor or beta blocker previously as her blood pressure is low normal and she has severe aortic stenosis. Check potassium and renal function.

## 2013-04-09 NOTE — Patient Instructions (Signed)
Your physician wants you to follow-up in: 6 MONTHS WITH DR Jens Som You will receive a reminder letter in the mail two months in advance. If you don't receive a letter, please call our office to schedule the follow-up appointment.   Your physician recommends that you HAVE LAB WORK TODAY  TAKE AN EXTRA 40 MG OF FUROSEMIDE AS NEEDED FOR SWELLING OR SHORTNESS OF BREATH

## 2013-04-09 NOTE — Assessment & Plan Note (Signed)
Continue present dose of Lasix. Take an additional 40 mg daily as needed for edema or dyspnea. Check potassium and renal function.

## 2013-04-19 ENCOUNTER — Encounter (HOSPITAL_BASED_OUTPATIENT_CLINIC_OR_DEPARTMENT_OTHER): Payer: Medicare Other | Attending: General Surgery

## 2013-04-19 DIAGNOSIS — I87319 Chronic venous hypertension (idiopathic) with ulcer of unspecified lower extremity: Secondary | ICD-10-CM | POA: Insufficient documentation

## 2013-04-19 DIAGNOSIS — L97909 Non-pressure chronic ulcer of unspecified part of unspecified lower leg with unspecified severity: Secondary | ICD-10-CM | POA: Insufficient documentation

## 2013-04-26 DIAGNOSIS — I87319 Chronic venous hypertension (idiopathic) with ulcer of unspecified lower extremity: Secondary | ICD-10-CM | POA: Diagnosis not present

## 2013-05-07 ENCOUNTER — Other Ambulatory Visit: Payer: Self-pay | Admitting: Cardiology

## 2013-05-13 ENCOUNTER — Other Ambulatory Visit: Payer: Self-pay | Admitting: Internal Medicine

## 2013-05-20 ENCOUNTER — Other Ambulatory Visit: Payer: Self-pay | Admitting: *Deleted

## 2013-05-20 ENCOUNTER — Telehealth: Payer: Self-pay | Admitting: Cardiology

## 2013-05-20 MED ORDER — FUROSEMIDE 20 MG PO TABS: 60.0000 mg | ORAL_TABLET | Freq: Every day | ORAL | Status: DC

## 2013-05-20 MED ORDER — FUROSEMIDE 20 MG PO TABS: ORAL_TABLET | ORAL | Status: DC

## 2013-05-20 NOTE — Telephone Encounter (Signed)
New message    Returned [phone] call

## 2013-05-20 NOTE — Telephone Encounter (Signed)
Spoke with pt dtr, furosemide script resent to the pharm

## 2013-05-24 ENCOUNTER — Encounter (HOSPITAL_BASED_OUTPATIENT_CLINIC_OR_DEPARTMENT_OTHER): Payer: Medicare Other | Attending: General Surgery

## 2013-05-24 DIAGNOSIS — L97909 Non-pressure chronic ulcer of unspecified part of unspecified lower leg with unspecified severity: Principal | ICD-10-CM | POA: Insufficient documentation

## 2013-05-24 DIAGNOSIS — I87319 Chronic venous hypertension (idiopathic) with ulcer of unspecified lower extremity: Secondary | ICD-10-CM | POA: Insufficient documentation

## 2013-06-21 ENCOUNTER — Encounter (HOSPITAL_BASED_OUTPATIENT_CLINIC_OR_DEPARTMENT_OTHER): Payer: Medicare Other | Attending: General Surgery

## 2013-06-21 DIAGNOSIS — L97809 Non-pressure chronic ulcer of other part of unspecified lower leg with unspecified severity: Secondary | ICD-10-CM | POA: Insufficient documentation

## 2013-06-25 ENCOUNTER — Ambulatory Visit: Payer: Medicare Other | Admitting: Internal Medicine

## 2013-06-26 ENCOUNTER — Encounter: Payer: Self-pay | Admitting: Internal Medicine

## 2013-06-26 ENCOUNTER — Ambulatory Visit (INDEPENDENT_AMBULATORY_CARE_PROVIDER_SITE_OTHER): Payer: Medicare Other | Admitting: Internal Medicine

## 2013-06-26 ENCOUNTER — Ambulatory Visit (INDEPENDENT_AMBULATORY_CARE_PROVIDER_SITE_OTHER)
Admission: RE | Admit: 2013-06-26 | Discharge: 2013-06-26 | Disposition: A | Discharge status: Home / Self Care | Payer: Medicare Other | Source: Ambulatory Visit | Attending: Internal Medicine | Admitting: Internal Medicine

## 2013-06-26 VITALS — BP 102/60 | HR 77 | Temp 98.1°F | Wt 103.5 lb

## 2013-06-26 DIAGNOSIS — I509 Heart failure, unspecified: Secondary | ICD-10-CM

## 2013-06-26 DIAGNOSIS — R0600 Dyspnea, unspecified: Secondary | ICD-10-CM | POA: Insufficient documentation

## 2013-06-26 DIAGNOSIS — I10 Essential (primary) hypertension: Secondary | ICD-10-CM

## 2013-06-26 DIAGNOSIS — R062 Wheezing: Secondary | ICD-10-CM

## 2013-06-26 MED ORDER — METHYLPREDNISOLONE ACETATE 80 MG/ML IJ SUSP
80.0000 mg | Freq: Once | INTRAMUSCULAR | Status: AC
  Administered 2013-06-26: 80 mg via INTRAMUSCULAR

## 2013-06-26 MED ORDER — PREDNISONE 10 MG PO TABS: ORAL_TABLET | ORAL | Status: DC

## 2013-06-26 NOTE — Patient Instructions (Signed)
You had the steroid shot today Please take all new medication as prescribed - the symbicort at 2 puffs twice per day until finished, and the very low dose prednisone Please continue all other medications as before, and refills have been done if requested. Please have the pharmacy call with any other refills you may need.  Please go to the XRAY Department in the Basement (go straight as you get off the elevator) for the x-ray testing  You will be contacted by phone if any changes need to be made immediately.  Otherwise, you will receive a letter about your results with an explanation, but please check with MyChart first.

## 2013-06-26 NOTE — Progress Notes (Signed)
Subjective:    Patient ID: Theresa Hill, female    DOB: [birth year], 80 y.o.   MRN: [id number]  HPI  Here with daughter, co/o 2 wks grad worsening general weakness, no fever, Denies urinary symptoms such as dysuria, frequency, urgency, flank pain, hematuria or n/v, fever, chills.  Pt denies chest pain, orthopnea, PND, increased LE swelling, palpitations, dizziness or syncope, but with last few days with onset wheezing and doe to 30 ft. Checks home wt every few days, ave about 103 on home scale (has lost wt overall from about 113 months ago) but has not really gained wt, no worsening LE edema. Has some chest congestion with white sputum.  Albuterol has helped at home, but not always and not enough. Nonsmoker. Last cxr mar 2014 with scarring. Past Medical History  Diagnosis Date  . HYPOTHYROIDISM 10/11/2006  . VITAMIN B12 DEFICIENCY 10/11/2006  . HYPONATREMIA 07/23/2007  . ANEMIA-NOS 09/14/2007  . HYPERTENSION 10/11/2006  . AORTIC STENOSIS 02/23/2007  . LEFT BUNDLE BRANCH BLOCK 10/11/2006  . CONGESTIVE HEART FAILURE 07/23/2007  . COPD 07/23/2007  . OSTEOPOROSIS 02/23/2007  . OSTEOPENIA 10/11/2006  . SAH (subarachnoid hemorrhage) 09/29/2010  . SDH (subdural hematoma) 09/29/2010  . GERD (gastroesophageal reflux disease) 09/29/2010  . DJD (degenerative joint disease) 09/29/2010   Past Surgical History  Procedure Laterality Date  . Tonsillectomy    . Abdominal hysterectomy    . Cataract surgery    . Appendectomy      reports that she has never smoked. She does not have any smokeless tobacco history on file. She reports that she does not drink alcohol or use illicit drugs. family history includes Cancer in her other; Diabetes in her other; Thyroid disease in her maternal grandmother and mother. Allergies  Allergen Reactions  . Cephalexin Nausea And Vomiting  . Doxycycline Hyclate Nausea And Vomiting  . Sulfamethoxazole-Trimethoprim Nausea And Vomiting    Only tablet form. IV is OK    Current  Outpatient Prescriptions on File Prior to Visit  Medication Sig Dispense Refill  . albuterol (PROVENTIL HFA;VENTOLIN HFA) 108 (90 BASE) MCG/ACT inhaler Inhale 2 puffs into the lungs every 6 (six) hours as needed for wheezing.  1 Inhaler  11  . alendronate (FOSAMAX) 70 MG tablet Take 70 mg by mouth once a week. Take with a full glass of water on an empty stomach. Takes on sundays      . alendronate (FOSAMAX) 70 MG tablet take 1 tablet by mouth every week  4 tablet  11  . Bioflavonoid Products (ESTER C PO) Take 1,000 mg by mouth daily.       . calcitRIOL (ROCALTROL) 0.25 MCG capsule Take 0.25 mcg by mouth daily.      . calcium carbonate (OS-CAL) 600 MG TABS Take 600 mg by mouth daily.       . furosemide (LASIX) 20 MG tablet Take three tablets once daily and two tablets as needed  450 tablet  3  . levothyroxine (SYNTHROID, LEVOTHROID) 75 MCG tablet Take 75 mcg by mouth daily before breakfast.      . Multiple Vitamin (MULITIVITAMIN WITH MINERALS) TABS Take 1 tablet by mouth daily.      Marland Kitchen omeprazole (PRILOSEC) 20 MG capsule Take 20 mg by mouth daily.      . potassium chloride SA (K-DUR,KLOR-CON) 20 MEQ tablet Take 20 mEq by mouth 2 (two) times daily.      . potassium chloride SA (K-DUR,KLOR-CON) 20 MEQ tablet take 1 tablet by mouth twice  a day  60 tablet  1  . traMADol-acetaminophen (ULTRACET) 37.5-325 MG per tablet Take 1 tablet by mouth every 6 (six) hours as needed for moderate pain.      Marland Kitchen triamcinolone cream (KENALOG) 0.1 % Apply 1 application topically 2 (two) times daily.       No current facility-administered medications on file prior to visit.   Review of Systems  Constitutional: Negative for unexpected weight change, or unusual diaphoresis  HENT: Negative for tinnitus.   Eyes: Negative for photophobia and visual disturbance.  Respiratory: Negative for choking and stridor.   Gastrointestinal: Negative for vomiting and blood in stool.  Genitourinary: Negative for hematuria and decreased  urine volume.  Musculoskeletal: Negative for acute joint swelling Skin: Negative for color change and wound.  Neurological: Negative for tremors and numbness other than noted  Psychiatric/Behavioral: Negative for decreased concentration or  hyperactivity.       Objective:   Physical Exam BP 102/60  Pulse 77  Temp(Src) 98.1 F (36.7 C) (Oral)  Wt 103 lb 8 oz (46.947 kg)  SpO2 95% (ambulatory sat 95% with slow walk for 50 ft) VS noted, alert, not as bright as usual, somewhat tachypnic Constitutional: Pt appears thin, frail.  HENT: Head: NCAT.  Right Ear: External ear normal.  Left Ear: External ear normal.  Eyes: Conjunctivae and EOM are normal. Pupils are equal, round, and reactive to light.  Neck: Normal range of motion. Neck supple.  Cardiovascular: Normal rate and regular rhythm.   Pulmonary/Chest: Effort normal and breath sounds decreased bilat, very few RLL   Abd:  Soft, NT, non-distended, + BS Neurological: Pt is alert. Not confused  Skin: Skin is warm. No erythema.  Psychiatric: Pt behavior is normal. Thought content normal.     Assessment & Plan:

## 2013-06-26 NOTE — Assessment & Plan Note (Addendum)
Exam most c/w copd exac - for depomedrol IM, very low dose predpack short course to try to avoid psychosis as she had in the past, symbicort 160 sample for 2 wks at 2 puffs bid, and cxr as well - r/o worsening vasc congestion/pulm edema, Note:  Total time for pt hx, exam, review of record with pt in the room, determination of diagnoses and plan for further eval and tx is > 40 min, with over 50% spent in coordination and counseling of patient

## 2013-06-26 NOTE — Progress Notes (Signed)
Pre-visit discussion using our clinic review tool. No additional management support is needed unless otherwise documented below in the visit note.  

## 2013-06-27 ENCOUNTER — Encounter: Payer: Self-pay | Admitting: Internal Medicine

## 2013-06-28 NOTE — Assessment & Plan Note (Signed)
stable overall by history and exam, recent data reviewed with pt, and pt to continue medical treatment as before,  to f/u any worsening symptoms or concerns BP Readings from Last 3 Encounters:  06/26/13 102/60  04/09/13 108/64  04/08/13 109/54

## 2013-06-28 NOTE — Assessment & Plan Note (Signed)
No definitive decompensation, for cont'd same tx for now

## 2013-07-12 ENCOUNTER — Other Ambulatory Visit: Payer: Self-pay | Admitting: Cardiology

## 2013-07-19 ENCOUNTER — Encounter (HOSPITAL_BASED_OUTPATIENT_CLINIC_OR_DEPARTMENT_OTHER): Payer: Medicare Other | Attending: General Surgery

## 2013-07-19 DIAGNOSIS — L97309 Non-pressure chronic ulcer of unspecified ankle with unspecified severity: Secondary | ICD-10-CM | POA: Insufficient documentation

## 2013-07-25 ENCOUNTER — Encounter: Payer: Self-pay | Admitting: Internal Medicine

## 2013-07-25 MED ORDER — PREDNISONE 10 MG PO TABS: ORAL_TABLET | ORAL | Status: DC

## 2013-07-25 NOTE — Telephone Encounter (Signed)
Robin to see above 

## 2013-08-09 ENCOUNTER — Other Ambulatory Visit: Payer: Self-pay | Admitting: Internal Medicine

## 2013-08-16 ENCOUNTER — Encounter (HOSPITAL_BASED_OUTPATIENT_CLINIC_OR_DEPARTMENT_OTHER): Payer: Medicare Other | Attending: General Surgery

## 2013-08-16 DIAGNOSIS — L97809 Non-pressure chronic ulcer of other part of unspecified lower leg with unspecified severity: Secondary | ICD-10-CM | POA: Insufficient documentation

## 2013-08-22 ENCOUNTER — Other Ambulatory Visit: Payer: Self-pay | Admitting: Internal Medicine

## 2013-08-22 NOTE — Telephone Encounter (Signed)
Faxed hardcopy to [facility].

## 2013-08-22 NOTE — Telephone Encounter (Signed)
Done hardcopy to robin  

## 2013-08-31 ENCOUNTER — Other Ambulatory Visit: Payer: Self-pay | Admitting: Cardiology

## 2013-09-12 ENCOUNTER — Other Ambulatory Visit: Payer: Self-pay | Admitting: Internal Medicine

## 2013-09-13 ENCOUNTER — Encounter (HOSPITAL_BASED_OUTPATIENT_CLINIC_OR_DEPARTMENT_OTHER): Payer: Medicare Other | Attending: General Surgery

## 2013-09-13 DIAGNOSIS — I87339 Chronic venous hypertension (idiopathic) with ulcer and inflammation of unspecified lower extremity: Secondary | ICD-10-CM | POA: Insufficient documentation

## 2013-09-13 DIAGNOSIS — L97809 Non-pressure chronic ulcer of other part of unspecified lower leg with unspecified severity: Secondary | ICD-10-CM | POA: Insufficient documentation

## 2013-09-13 DIAGNOSIS — L97909 Non-pressure chronic ulcer of unspecified part of unspecified lower leg with unspecified severity: Principal | ICD-10-CM

## 2013-10-08 ENCOUNTER — Encounter: Payer: Self-pay | Admitting: Internal Medicine

## 2013-10-14 ENCOUNTER — Encounter: Payer: Self-pay | Admitting: Internal Medicine

## 2013-10-14 ENCOUNTER — Encounter: Payer: Self-pay | Admitting: Cardiology

## 2013-10-14 ENCOUNTER — Telehealth: Payer: Self-pay | Admitting: Internal Medicine

## 2013-10-14 ENCOUNTER — Ambulatory Visit (INDEPENDENT_AMBULATORY_CARE_PROVIDER_SITE_OTHER): Payer: Medicare Other | Admitting: Cardiology

## 2013-10-14 ENCOUNTER — Other Ambulatory Visit: Payer: Self-pay | Admitting: Internal Medicine

## 2013-10-14 VITALS — BP 80/60 | HR 67 | Ht 60.0 in | Wt 94.0 lb

## 2013-10-14 DIAGNOSIS — I359 Nonrheumatic aortic valve disorder, unspecified: Secondary | ICD-10-CM

## 2013-10-14 DIAGNOSIS — I509 Heart failure, unspecified: Secondary | ICD-10-CM

## 2013-10-14 DIAGNOSIS — I447 Left bundle-branch block, unspecified: Secondary | ICD-10-CM

## 2013-10-14 LAB — BASIC METABOLIC PANEL
BUN: 37 mg/dL — AB (ref 6–23)
CHLORIDE: 99 meq/L (ref 96–112)
CO2: 30 meq/L (ref 19–32)
CREATININE: 1.3 mg/dL — AB (ref 0.4–1.2)
Calcium: 11.1 mg/dL — ABNORMAL HIGH (ref 8.4–10.5)
GFR: 41.89 mL/min — ABNORMAL LOW (ref >60.00)
GLUCOSE: 66 mg/dL — AB (ref 70–99)
Potassium: 3.9 mEq/L (ref 3.5–5.1)
Sodium: 138 mEq/L (ref 135–145)

## 2013-10-14 NOTE — Telephone Encounter (Signed)
Patient's daughter is calling to see if Dr. Jenny Reichmann wants her to continue to take predniSONE (DELTASONE) 10 MG tablet. Please advise.

## 2013-10-14 NOTE — Assessment & Plan Note (Signed)
Patient denies dyspnea. Continue present dose of Lasix. Check potassium and renal function. Note she has been on continuous steroids since February. I have asked her to contact Dr. Jenny Reichmann concerning this issue. This could exacerbate her CHF. I have instructed them not to discontinue abruptly but this will need to be weaned under the direction of Dr. Jenny Reichmann.

## 2013-10-14 NOTE — Assessment & Plan Note (Signed)
The patient has severe aortic stenosis. Given her age and overall medical condition she is not a candidate for valve replacement. She does not want to consider other procedures such as TAVR and also would most likely not be a candidate. They understand this will progress and will ultimately take her life. She is very frail. Conservative measures only. Continue Lasix at present dose. Take additional 40 mg daily PRN increased dyspnea or edema. No plans to repeat echocardiogram. Patient is a no CODE BLUE including no defibrillation, CPR or intubation. I have not added an ACE inhibitor or beta blocker previously as her blood pressure is low and she has severe aortic stenosis. Check potassium and renal function. 

## 2013-10-14 NOTE — Progress Notes (Signed)
HPI: FU AS. Echocardiogram in June 2012 showed an ejection fraction of 40%, severe aortic stenosis with a mean gradient of 43 mmHg, mild AI, moderate mitral regurgitation, moderate left atrial enlargement, mild right atrial and right ventricular enlargement. When I saw her previously I explained that she was not a candidate for aortic valve replacement and she and her daughter were also in agreement with that. Did not want to pursue TAVR. She is a no CODE BLUE and we are treating medically and not pursuing followup echocardiograms. Patient seen by primary care in February with complaints of wheezing. She was placed on steroids and has been on continuously since then. She has some agitation her daughter feels is related to steroids. She denies dyspnea, chest pain or syncope.  Current Outpatient Prescriptions  Medication Sig Dispense Refill  . alendronate (FOSAMAX) 70 MG tablet Take 70 mg by mouth once a week. Take with a full glass of water on an empty stomach. Takes on sundays      . Bioflavonoid Products (ESTER C PO) Take 1,000 mg by mouth daily.       . calcitRIOL (ROCALTROL) 0.25 MCG capsule Take 0.25 mcg by mouth daily.      . calcium carbonate (OS-CAL) 600 MG TABS Take 600 mg by mouth daily.       . furosemide (LASIX) 20 MG tablet Take three tablets once daily and two tablets as needed  450 tablet  3  . levothyroxine (SYNTHROID, LEVOTHROID) 75 MCG tablet Take 75 mcg by mouth daily before breakfast.      . levothyroxine (SYNTHROID, LEVOTHROID) 75 MCG tablet take 1 tablet by mouth once daily  90 tablet  3  . Multiple Vitamin (MULITIVITAMIN WITH MINERALS) TABS Take 1 tablet by mouth daily.      Marland Kitchen omeprazole (PRILOSEC) 20 MG capsule Take 20 mg by mouth daily.      . potassium chloride SA (K-DUR,KLOR-CON) 20 MEQ tablet take 1 tablet by mouth twice a day  60 tablet  1  . predniSONE (DELTASONE) 10 MG tablet 1 tabs by mouth per day  30 tablet  5  . PROAIR HFA 108 (90 BASE) MCG/ACT inhaler  inhale 2 puffs by mouth every 6 hours if needed for wheezing  8.5 g  11  . traMADol-acetaminophen (ULTRACET) 37.5-325 MG per tablet take 1 tablet by mouth every 6 hours if needed for pain  120 tablet  1  . triamcinolone cream (KENALOG) 0.1 % Apply 1 application topically 2 (two) times daily.       No current facility-administered medications for this visit.     Past Medical History  Diagnosis Date  . HYPOTHYROIDISM 10/11/2006  . VITAMIN B12 DEFICIENCY 10/11/2006  . HYPONATREMIA 07/23/2007  . ANEMIA-NOS 09/14/2007  . HYPERTENSION 10/11/2006  . AORTIC STENOSIS 02/23/2007  . LEFT BUNDLE BRANCH BLOCK 10/11/2006  . CONGESTIVE HEART FAILURE 07/23/2007  . COPD 07/23/2007  . OSTEOPOROSIS 02/23/2007  . OSTEOPENIA 10/11/2006  . SAH (subarachnoid hemorrhage) 09/29/2010  . SDH (subdural hematoma) 09/29/2010  . GERD (gastroesophageal reflux disease) 09/29/2010  . DJD (degenerative joint disease) 09/29/2010    Past Surgical History  Procedure Laterality Date  . Tonsillectomy    . Abdominal hysterectomy    . Cataract surgery    . Appendectomy      History   Social History  . Marital Status: Widowed    Spouse Name: N/A    Number of Children: 1  . Years of Education: N/A  Occupational History  .     Social History Main Topics  . Smoking status: Never Smoker   . Smokeless tobacco: Not on file  . Alcohol Use: No  . Drug Use: No  . Sexual Activity: Not on file   Other Topics Concern  . Not on file   Social History Narrative   Lives with daughter.    ROS: weakness and agitation but no fevers or chills, productive cough, hemoptysis, dysphasia, odynophagia, melena, hematochezia, dysuria, hematuria, rash, seizure activity, orthopnea, PND, claudication. Remaining systems are negative.  Physical Exam: Well-developed frail in no acute distress.  Skin is warm and dry.  HEENT is normal.  Neck is supple.  Chest is clear to auscultation with normal expansion.  Cardiovascular exam is regular rate  and rhythm. 2/6 systolic murmur Abdominal exam nontender or distended. No masses palpated. Extremities dressed neuro grossly intact  ECG Sinus rhythm at a rate of 67. Left ventricular hypertrophy with QRS widening and repolarization abnormality.

## 2013-10-14 NOTE — Patient Instructions (Signed)
Your physician wants you to follow-up in: 6 MONTHS WITH DR CRENSHAW You will receive a reminder letter in the mail two months in advance. If you don't receive a letter, please call our office to schedule the follow-up appointment.   Your physician recommends that you HAVE LAB WORK TODAY 

## 2013-10-15 NOTE — Telephone Encounter (Signed)
OK for now to take HALF per day for at least 2 wks, as stopping previously seemed to be assoc with worsening debility, and failure to thrive, with a suspicion at the time for possible underlying adrenal insufficiency

## 2013-10-15 NOTE — Telephone Encounter (Signed)
Informed the patients daughter of MD instructions on medication.

## 2013-10-15 NOTE — Telephone Encounter (Signed)
Things such as worsening general weakness, not responding as well to others, no motivation to help take care of herself, and often lack of appetite, and often less attention to taking care of her health needs and taking meds

## 2013-10-15 NOTE — Telephone Encounter (Signed)
This should be enough time to assess if she is worsening as before with failure to thrive, but if doing ok, could consider stopping

## 2013-10-15 NOTE — Telephone Encounter (Signed)
The daughter did call back and would like to know what to look for regarding failure to thrive ?

## 2013-10-15 NOTE — Telephone Encounter (Signed)
Informed the daughter of MD instructions.  She wanted to know at the end of the 2 week period what do they do then?

## 2013-10-16 NOTE — Telephone Encounter (Signed)
Called the patients daughter informed of MD instructions.

## 2013-10-28 ENCOUNTER — Encounter: Payer: Self-pay | Admitting: Internal Medicine

## 2013-10-29 NOTE — Telephone Encounter (Signed)
Theresa Hill to call daughter of pt - ok for OV next available

## 2013-10-30 NOTE — Telephone Encounter (Signed)
I spoke with Ms Shaylynne daughter.  She is feeling better.  She set up her physical for July 1.

## 2013-11-05 ENCOUNTER — Other Ambulatory Visit: Payer: Self-pay | Admitting: Cardiology

## 2013-11-08 ENCOUNTER — Encounter (HOSPITAL_BASED_OUTPATIENT_CLINIC_OR_DEPARTMENT_OTHER): Payer: Medicare Other | Attending: General Surgery

## 2013-11-08 DIAGNOSIS — L97809 Non-pressure chronic ulcer of other part of unspecified lower leg with unspecified severity: Secondary | ICD-10-CM | POA: Insufficient documentation

## 2013-11-19 ENCOUNTER — Encounter: Payer: Medicare Other | Admitting: Internal Medicine

## 2013-11-22 ENCOUNTER — Ambulatory Visit (INDEPENDENT_AMBULATORY_CARE_PROVIDER_SITE_OTHER)
Admission: RE | Admit: 2013-11-22 | Discharge: 2013-11-22 | Disposition: A | Discharge status: Home / Self Care | Payer: Medicare Other | Source: Ambulatory Visit | Attending: Internal Medicine | Admitting: Internal Medicine

## 2013-11-22 ENCOUNTER — Ambulatory Visit (INDEPENDENT_AMBULATORY_CARE_PROVIDER_SITE_OTHER): Payer: Medicare Other | Admitting: Internal Medicine

## 2013-11-22 ENCOUNTER — Other Ambulatory Visit (INDEPENDENT_AMBULATORY_CARE_PROVIDER_SITE_OTHER): Payer: Medicare Other

## 2013-11-22 ENCOUNTER — Encounter (HOSPITAL_BASED_OUTPATIENT_CLINIC_OR_DEPARTMENT_OTHER): Payer: Medicare Other | Attending: General Surgery

## 2013-11-22 ENCOUNTER — Encounter: Payer: Self-pay | Admitting: Internal Medicine

## 2013-11-22 VITALS — BP 110/68 | HR 85 | Temp 98.1°F | Wt 101.0 lb

## 2013-11-22 DIAGNOSIS — I10 Essential (primary) hypertension: Secondary | ICD-10-CM

## 2013-11-22 DIAGNOSIS — L97809 Non-pressure chronic ulcer of other part of unspecified lower leg with unspecified severity: Secondary | ICD-10-CM | POA: Diagnosis present

## 2013-11-22 DIAGNOSIS — R062 Wheezing: Secondary | ICD-10-CM

## 2013-11-22 DIAGNOSIS — J449 Chronic obstructive pulmonary disease, unspecified: Secondary | ICD-10-CM

## 2013-11-22 DIAGNOSIS — I509 Heart failure, unspecified: Secondary | ICD-10-CM

## 2013-11-22 LAB — HEPATIC FUNCTION PANEL
ALT: 20 U/L (ref 0–35)
AST: 29 U/L (ref 0–37)
Albumin: 3.7 g/dL (ref 3.5–5.2)
Alkaline Phosphatase: 64 U/L (ref 39–117)
Bilirubin, Direct: 0.2 mg/dL (ref 0.0–0.3)
Total Bilirubin: 0.8 mg/dL (ref 0.2–1.2)
Total Protein: 6.7 g/dL (ref 6.0–8.3)

## 2013-11-22 LAB — CBC WITH DIFFERENTIAL/PLATELET
BASOS PCT: 0.2 % (ref 0.0–3.0)
Basophils Absolute: 0 10*3/uL (ref 0.0–0.1)
EOS PCT: 1.1 % (ref 0.0–5.0)
Eosinophils Absolute: 0.1 10*3/uL (ref 0.0–0.7)
HEMATOCRIT: 33.2 % — AB (ref 36.0–46.0)
Hemoglobin: 10.8 g/dL — ABNORMAL LOW (ref 12.0–15.0)
LYMPHS ABS: 1 10*3/uL (ref 0.7–4.0)
Lymphocytes Relative: 11.8 % — ABNORMAL LOW (ref 12.0–46.0)
MCHC: 32.6 g/dL (ref 30.0–36.0)
MCV: 109.3 fl — ABNORMAL HIGH (ref 78.0–100.0)
MONOS PCT: 10.5 % (ref 3.0–12.0)
Monocytes Absolute: 0.9 10*3/uL (ref 0.1–1.0)
Neutro Abs: 6.6 10*3/uL (ref 1.4–7.7)
Neutrophils Relative %: 76.4 % (ref 43.0–77.0)
PLATELETS: 254 10*3/uL (ref 150.0–400.0)
RBC: 3.04 Mil/uL — AB (ref 3.87–5.11)
RDW: 19.8 % — ABNORMAL HIGH (ref 11.5–15.5)
WBC: 8.7 10*3/uL (ref 4.0–10.5)

## 2013-11-22 LAB — BASIC METABOLIC PANEL
BUN: 29 mg/dL — AB (ref 6–23)
CO2: 30 mEq/L (ref 19–32)
CREATININE: 1.1 mg/dL (ref 0.4–1.2)
Calcium: 11.3 mg/dL — ABNORMAL HIGH (ref 8.4–10.5)
Chloride: 100 mEq/L (ref 96–112)
GFR: 50.03 mL/min — AB (ref >60.00)
GLUCOSE: 95 mg/dL (ref 70–99)
Potassium: 4.4 mEq/L (ref 3.5–5.1)
Sodium: 138 mEq/L (ref 135–145)

## 2013-11-22 MED ORDER — BUDESONIDE-FORMOTEROL FUMARATE 160-4.5 MCG/ACT IN AERO
2.0000 | INHALATION_SPRAY | Freq: Two times a day (BID) | RESPIRATORY_TRACT | Status: AC
Start: 2013-11-22 — End: ?

## 2013-11-22 NOTE — Patient Instructions (Signed)
Please take all new medication as prescribed - the symbicort as directed  Please continue all other medications as before, and refills have been done if requested.  Please have the pharmacy call with any other refills you may need.  Please continue your efforts at being more active, low cholesterol diet, and weight control.  You are otherwise up to date with prevention measures today.  Please keep your appointments with your specialists as you may have planned  Please go to the XRAY Department in the Basement (go straight as you get off the elevator) for the x-ray testing  Please go to the LAB in the Basement (turn left off the elevator) for the tests to be done today  You will be contacted by phone if any changes need to be made immediately.  Otherwise, you will receive a letter about your results with an explanation, but please check with MyChart first.  Please remember to sign up for MyChart if you have not done so, as this will be important to you in the future with finding out test results, communicating by private email, and scheduling acute appointments online when needed.

## 2013-11-22 NOTE — Assessment & Plan Note (Signed)
No evidence for volume increase today,to cont lasix 20 bid, f/u card

## 2013-11-22 NOTE — Progress Notes (Signed)
Subjective:    Patient ID: Theresa Hill, female    DOB: [birth year], 80 y.o.   MRN: [id number]  HPI  Here to f/u, now off the prednisone completely after weaning x 2 wks now, also with lasix being reduced now to 20 mg twice per day. Immediately after stopping prednisone seemed to have worsening wheezing/sob/doe without fever, prod cough,  And Pt denies chest pain, orthopnea, PND, increased LE swelling, palpitations, dizziness or syncope.   Pt denies polydipsia, polyuria, Pt denies new neurological symptoms such as new headache, or facial or extremity weakness or numbness.  Last proair use 0830 this am Past Medical History  Diagnosis Date  . HYPOTHYROIDISM 10/11/2006  . VITAMIN B12 DEFICIENCY 10/11/2006  . HYPONATREMIA 07/23/2007  . ANEMIA-NOS 09/14/2007  . HYPERTENSION 10/11/2006  . AORTIC STENOSIS 02/23/2007  . LEFT BUNDLE BRANCH BLOCK 10/11/2006  . CONGESTIVE HEART FAILURE 07/23/2007  . COPD 07/23/2007  . OSTEOPOROSIS 02/23/2007  . OSTEOPENIA 10/11/2006  . SAH (subarachnoid hemorrhage) 09/29/2010  . SDH (subdural hematoma) 09/29/2010  . GERD (gastroesophageal reflux disease) 09/29/2010  . DJD (degenerative joint disease) 09/29/2010   Past Surgical History  Procedure Laterality Date  . Tonsillectomy    . Abdominal hysterectomy    . Cataract surgery    . Appendectomy      reports that she has never smoked. She does not have any smokeless tobacco history on file. She reports that she does not drink alcohol or use illicit drugs. family history includes Cancer in her other; Diabetes in her other; Thyroid disease in her maternal grandmother and mother. Allergies  Allergen Reactions  . Cephalexin Nausea And Vomiting  . Doxycycline Hyclate Nausea And Vomiting  . Sulfamethoxazole-Trimethoprim Nausea And Vomiting    Only tablet form. IV is OK    Current Outpatient Prescriptions on File Prior to Visit  Medication Sig Dispense Refill  . alendronate (FOSAMAX) 70 MG tablet Take 70 mg by mouth once a  week. Take with a full glass of water on an empty stomach. Takes on sundays      . Bioflavonoid Products (ESTER C PO) Take 1,000 mg by mouth daily.       . calcitRIOL (ROCALTROL) 0.25 MCG capsule Take 0.25 mcg by mouth daily.      . calcium carbonate (OS-CAL) 600 MG TABS Take 600 mg by mouth daily.       . furosemide (LASIX) 20 MG tablet Take three tablets once daily and two tablets as needed  450 tablet  3  . levothyroxine (SYNTHROID, LEVOTHROID) 75 MCG tablet Take 75 mcg by mouth daily before breakfast.      . Multiple Vitamin (MULITIVITAMIN WITH MINERALS) TABS Take 1 tablet by mouth daily.      Marland Kitchen omeprazole (PRILOSEC) 20 MG capsule take 1 capsule by mouth once daily  90 capsule  3  . potassium chloride SA (K-DUR,KLOR-CON) 20 MEQ tablet take 1 tablet by mouth twice a day  60 tablet  5  . PROAIR HFA 108 (90 BASE) MCG/ACT inhaler inhale 2 puffs by mouth every 6 hours if needed for wheezing  8.5 g  11  . traMADol-acetaminophen (ULTRACET) 37.5-325 MG per tablet take 1 tablet by mouth every 6 hours if needed for pain  120 tablet  1  . triamcinolone cream (KENALOG) 0.1 % Apply 1 application topically 2 (two) times daily.       No current facility-administered medications on file prior to visit.   Review of Systems  Constitutional: Negative for  unusual diaphoresis or other sweats  HENT: Negative for ringing in ear Eyes: Negative for double vision or worsening visual disturbance.  Respiratory: Negative for choking and stridor.   Gastrointestinal: Negative for vomiting or other signifcant bowel change Genitourinary: Negative for hematuria or decreased urine volume.  Musculoskeletal: Negative for other MSK pain or swelling Skin: Negative for color change and worsening wound.  Neurological: Negative for tremors and numbness other than noted  Psychiatric/Behavioral: Negative for decreased concentration or agitation other than above       Objective:   Physical Exam BP 110/68  Pulse 85  Temp(Src)  98.1 F (36.7 C) (Oral)  Wt 101 lb (45.813 kg)  SpO2 94% at rest, 96% with ambulation VS noted, frail but stronger stamina appearing since last visit, has picked up several lbs Constitutional: Pt appears well-developed, well-nourished.  HENT: Head: NCAT.  Right Ear: External ear normal.  Left Ear: External ear normal.  Eyes: . Pupils are equal, round, and reactive to light. Conjunctivae and EOM are normal Neck: Normal range of motion. Neck supple.  Cardiovascular: Normal rate and regular rhythm.   Pulmonary/Chest: Effort normal and breath sounds decreased without frank rales or wheezing.  Abd:  Soft, NT, ND, + BS Neurological: Pt is alert. Not confused , motor grossly intact Skin: Skin is warm. No rash Psychiatric: Pt behavior is normal. No agitation.     Assessment & Plan:

## 2013-11-22 NOTE — Assessment & Plan Note (Addendum)
amb o2 sat ok today, little to no wheezing on exam currently and no evidence for volume overload or pna or other, but has had to incr her proair use to qid prn, ok to try add symbicort asd, will avoid further prednisone for now; also for cxr, lab r/o anemia

## 2013-11-22 NOTE — Assessment & Plan Note (Signed)
O/w stable it seems, no fever or need for antibx at this time, pending cxr and lab eval

## 2013-11-22 NOTE — Progress Notes (Signed)
Pre visit review using our clinic review tool, if applicable. No additional management support is needed unless otherwise documented below in the visit note. 

## 2013-11-22 NOTE — Assessment & Plan Note (Signed)
stable overall by history and exam, recent data reviewed with pt, and pt to continue medical treatment as before,  to f/u any worsening symptoms or concerns BP Readings from Last 3 Encounters:  11/22/13 110/68  10/14/13 80/60  06/26/13 102/60

## 2013-12-03 ENCOUNTER — Other Ambulatory Visit: Payer: Self-pay | Admitting: Internal Medicine

## 2013-12-10 ENCOUNTER — Telehealth: Payer: Self-pay

## 2013-12-10 NOTE — Telephone Encounter (Signed)
Informed the daughter of MD instructions.

## 2013-12-10 NOTE — Telephone Encounter (Signed)
The daughter of this patient Theresa Hill states the patient has no energy/tired.  She does know probably that this is expected, but is there anything they can do or give her to help?

## 2013-12-10 NOTE — Telephone Encounter (Signed)
Called left message to call back 

## 2013-12-10 NOTE — Telephone Encounter (Signed)
Since this is rather nonspecific problem, I dont really have a good answer, except to continue to monitor for any worsening weakness, dizzy, falls, fever, pain, wt loss.  I dont think her medications could be the cause.  We could refer to outpt PT if she seems worse to the point she is not walking well, or at higher risk for fall than recently.

## 2013-12-13 DIAGNOSIS — L97809 Non-pressure chronic ulcer of other part of unspecified lower leg with unspecified severity: Secondary | ICD-10-CM | POA: Diagnosis not present

## 2013-12-16 ENCOUNTER — Encounter: Payer: Self-pay | Admitting: Internal Medicine

## 2013-12-16 MED ORDER — PREDNISONE 10 MG PO TABS: ORAL_TABLET | ORAL | Status: DC

## 2013-12-17 NOTE — Telephone Encounter (Signed)
We should offer appt for 1-2 or 3 days if ok with daughter

## 2013-12-24 ENCOUNTER — Other Ambulatory Visit: Payer: Medicare Other

## 2013-12-26 ENCOUNTER — Encounter: Payer: Self-pay | Admitting: Internal Medicine

## 2013-12-27 NOTE — Telephone Encounter (Signed)
Theresa Hill to see above, ask Daughter of pt to consider taking to ER (by ambulance if necessary) since we are not sure of how tx further by phone, and if she would consider a referral to hospice?

## 2014-01-03 ENCOUNTER — Encounter (HOSPITAL_BASED_OUTPATIENT_CLINIC_OR_DEPARTMENT_OTHER): Payer: Medicare Other | Attending: General Surgery

## 2014-01-03 DIAGNOSIS — L089 Local infection of the skin and subcutaneous tissue, unspecified: Secondary | ICD-10-CM | POA: Diagnosis not present

## 2014-01-03 DIAGNOSIS — L97309 Non-pressure chronic ulcer of unspecified ankle with unspecified severity: Secondary | ICD-10-CM | POA: Insufficient documentation

## 2014-01-08 ENCOUNTER — Encounter: Payer: Self-pay | Admitting: Internal Medicine

## 2014-01-08 ENCOUNTER — Ambulatory Visit (INDEPENDENT_AMBULATORY_CARE_PROVIDER_SITE_OTHER): Payer: Medicare Other | Admitting: Internal Medicine

## 2014-01-08 VITALS — BP 90/60 | HR 101 | Temp 98.2°F | Wt 97.0 lb

## 2014-01-08 DIAGNOSIS — I509 Heart failure, unspecified: Secondary | ICD-10-CM

## 2014-01-08 DIAGNOSIS — Z23 Encounter for immunization: Secondary | ICD-10-CM

## 2014-01-08 DIAGNOSIS — E538 Deficiency of other specified B group vitamins: Secondary | ICD-10-CM

## 2014-01-08 DIAGNOSIS — J441 Chronic obstructive pulmonary disease with (acute) exacerbation: Secondary | ICD-10-CM

## 2014-01-08 DIAGNOSIS — Z515 Encounter for palliative care: Secondary | ICD-10-CM

## 2014-01-08 DIAGNOSIS — I10 Essential (primary) hypertension: Secondary | ICD-10-CM

## 2014-01-08 MED ORDER — PREDNISONE 10 MG PO TABS: ORAL_TABLET | ORAL | Status: DC

## 2014-01-08 MED ORDER — FUROSEMIDE 20 MG PO TABS: ORAL_TABLET | ORAL | Status: DC

## 2014-01-08 MED ORDER — METHYLPREDNISOLONE ACETATE 80 MG/ML IJ SUSP
80.0000 mg | Freq: Once | INTRAMUSCULAR | Status: AC
  Administered 2014-01-08: 80 mg via INTRAMUSCULAR

## 2014-01-08 MED ORDER — CYANOCOBALAMIN 1000 MCG/ML IJ SOLN
1000.0000 ug | Freq: Once | INTRAMUSCULAR | Status: AC
  Administered 2014-01-08: 1000 ug via INTRAMUSCULAR

## 2014-01-08 MED ORDER — LEVOFLOXACIN 250 MG PO TABS: 250.0000 mg | ORAL_TABLET | Freq: Every day | ORAL | Status: DC

## 2014-01-08 NOTE — Patient Instructions (Addendum)
You had the flu shot today, and the steroid shot  Please take all new medication as prescribed - the prednisone "burst" higher dose, then 10 mg per day after that, and the antibiotic  OK to decrease the lasix to 20 mg per day  OK to stop the fosamax at this time  Please continue all other medications as before, and refills have been done if requested - the symbicort  Please have the pharmacy call with any other refills you may need.  Please keep your appointments with your specialists as you may have planned - cardiology  You are given the signed out of hospital DNR form today  Please call if you would like a Hospice referral at any time  We can hold on further CXR or labs today  Please return in 1 months, or sooner if needed

## 2014-01-08 NOTE — Progress Notes (Signed)
Pre visit review using our clinic review tool, if applicable. No additional management support is needed unless otherwise documented below in the visit note. 

## 2014-01-08 NOTE — Assessment & Plan Note (Signed)
D/w daughter, out of hosp DNR form signed and given to daughter; she will also consider hospice for now

## 2014-01-08 NOTE — Addendum Note (Signed)
Addended by: Sharon Seller B on: 01/08/2014 03:02 PM   Modules accepted: Orders

## 2014-01-08 NOTE — Assessment & Plan Note (Signed)
Wt up 3 lbs but no overt clinical failure on exam, and i am concerned she may actually be toward the lower volume side, ok to decr the lasix to 20 qd

## 2014-01-08 NOTE — Assessment & Plan Note (Signed)
BP borderline low today, for lasix decrease, on no other BP meds at this time

## 2014-01-08 NOTE — Progress Notes (Signed)
Subjective:    Patient ID: Theresa Hill, female    DOB: Jul 15, 1915, 80 y.o.   MRN: [id number]  HPI    Here with worsening dyspnea; has been a gradually worsening issue x 2 wks, Ran out of symbicort  X 2days. Finished prednisone over 2wks ago, which to daughter seemed to help the most.  Did complete a weeks worth of increased lasix per rec of Dr Jerline Pain, wound clinic MD in july, so took 3 pill x 20 mg each lasix daily, did not seem to help, only taking 2 per day since then. Taking somewhat less po recently.  Some trouble swallowing but no choking or dysphagia over baseline.  No overt aspiration.  Eats regular diet, soft foods only, no salt added, often smoothies with protein per daughter.  Wt up 3 lbs from June 2015, has been up and down on wt. Last saw card June 1. Still sees wound clinic every 3wks. No pulmonary MD.  Pt denies chest pain, orthopnea, PND, increased LE swelling over baseline, palpitations, dizziness or syncope. Except for last 2 -3 days, she has needed minimal help with ambulating room to room, toileting, sponge baths only due to leg wounds, feeding herself.  End of life d/w daughter today - she is OK with out of hosp DNR, will consider hospice but not ready to commit to this today  Reviewed July 2015 cxr, and lab with pt and daughter Past Medical History  Diagnosis Date  . HYPOTHYROIDISM 10/11/2006  . VITAMIN B12 DEFICIENCY 10/11/2006  . HYPONATREMIA 07/23/2007  . ANEMIA-NOS 09/14/2007  . HYPERTENSION 10/11/2006  . AORTIC STENOSIS 02/23/2007  . LEFT BUNDLE BRANCH BLOCK 10/11/2006  . CONGESTIVE HEART FAILURE 07/23/2007  . COPD 07/23/2007  . OSTEOPOROSIS 02/23/2007  . OSTEOPENIA 10/11/2006  . SAH (subarachnoid hemorrhage) 09/29/2010  . SDH (subdural hematoma) 09/29/2010  . GERD (gastroesophageal reflux disease) 09/29/2010  . DJD (degenerative joint disease) 09/29/2010   Past Surgical History  Procedure Laterality Date  . Tonsillectomy    . Abdominal hysterectomy    . Cataract surgery     . Appendectomy      reports that she has never smoked. She does not have any smokeless tobacco history on file. She reports that she does not drink alcohol or use illicit drugs. family history includes Cancer in her other; Diabetes in her other; Thyroid disease in her maternal grandmother and mother. Allergies  Allergen Reactions  . Cephalexin Nausea And Vomiting  . Doxycycline Hyclate Nausea And Vomiting  . Sulfamethoxazole-Trimethoprim Nausea And Vomiting    Only tablet form. IV is OK    Current Outpatient Prescriptions on File Prior to Visit  Medication Sig Dispense Refill  . alendronate (FOSAMAX) 70 MG tablet Take 70 mg by mouth once a week. Take with a full glass of water on an empty stomach. Takes on sundays      . Bioflavonoid Products (ESTER C PO) Take 1,000 mg by mouth daily.       . budesonide-formoterol (SYMBICORT) 160-4.5 MCG/ACT inhaler Inhale 2 puffs into the lungs 2 (two) times daily.  1 Inhaler  12  . calcitRIOL (ROCALTROL) 0.25 MCG capsule Take 0.25 mcg by mouth daily.      . calcium carbonate (OS-CAL) 600 MG TABS Take 600 mg by mouth daily.       . furosemide (LASIX) 20 MG tablet Take three tablets once daily and two tablets as needed  450 tablet  3  . levothyroxine (SYNTHROID, LEVOTHROID) 75 MCG tablet Take  75 mcg by mouth daily before breakfast.      . Multiple Vitamin (MULITIVITAMIN WITH MINERALS) TABS Take 1 tablet by mouth daily.      Marland Kitchen omeprazole (PRILOSEC) 20 MG capsule take 1 capsule by mouth once daily  90 capsule  3  . potassium chloride SA (K-DUR,KLOR-CON) 20 MEQ tablet take 1 tablet by mouth twice a day  60 tablet  5  . predniSONE (DELTASONE) 10 MG tablet 3 tabs by mouth per day for 3 days, 2tabs per day for 3 days,1tab per day for 3 days  18 tablet  0  . PROAIR HFA 108 (90 BASE) MCG/ACT inhaler inhale 2 puffs by mouth every 6 hours if needed for wheezing  8.5 g  11  . traMADol-acetaminophen (ULTRACET) 37.5-325 MG per tablet take 1 tablet by mouth every 6  hours if needed for pain  120 tablet  1  . triamcinolone cream (KENALOG) 0.1 % Apply 1 application topically 2 (two) times daily.       No current facility-administered medications on file prior to visit.   Review of Systems  Constitutional: Negative for unusual diaphoresis or other sweats  HENT: Negative for ringing in ear Eyes: Negative for double vision or worsening visual disturbance.  Respiratory: Negative for choking and stridor.   Gastrointestinal: Negative for vomiting or other signifcant bowel change Genitourinary: Negative for hematuria or decreased urine volume.  Musculoskeletal: Negative for other MSK pain or swelling Skin: Negative for color change and worsening wound.  Neurological: Negative for tremors and numbness other than noted  Psychiatric/Behavioral: Negative for decreased concentration or agitation other than above       Objective:   Physical Exam BP 90/60  Pulse 101  Temp(Src) 98.2 F (36.8 C) (Oral)  Wt 97 lb (43.999 kg)  SpO2 94% VS noted, with incresaed RR to 20, nontoxic but generally weak, frail,  Tends now to lean over in chair frequently, has to work to keep herself sitting up in chair Constitutional: Pt appears well-developed, well-nourished.  HENT: Head: NCAT.  Right Ear: External ear normal.  Left Ear: External ear normal.  Eyes: . Pupils are equal, round, and reactive to light. Conjunctivae and EOM are normal Neck: Normal range of motion. Neck supple.  Cardiovascular: Normal rate and regular rhythm.   Pulmonary/Chest: Effort normal and breath sounds decreased, few right basilar rales only  - ? signficance.  Abd:  Soft, NT, ND, + BS Neurological: Pt is alert. Not confused , motor grossly intact Skin: Skin is warm. No rash Psychiatric: Pt behavior is normal. No agitation.     Assessment & Plan:   Wt Readings from Last 3 Encounters:  01/08/14 97 lb (43.999 kg)  11/22/13 101 lb (45.813 kg)  10/14/13 94 lb (42.638 kg)

## 2014-01-08 NOTE — Assessment & Plan Note (Addendum)
Primary issue seems to be copd exac - for depomedrol IM, predpac asd, then 10 mg prednisone per day chronic after that, cont symbicort, declines spacer, cont albut prnn as well, declines cxr, labs today; also for empiric antibx though admittedly little evidence for pna at this time  Note:  Total time for pt hx, exam, review of record with pt in the room, determination of diagnoses and plan for further eval and tx is > 40 min, with over 50% spent in coordination and counseling of patient

## 2014-01-24 ENCOUNTER — Encounter (HOSPITAL_BASED_OUTPATIENT_CLINIC_OR_DEPARTMENT_OTHER): Payer: Medicare Other | Attending: General Surgery

## 2014-01-24 DIAGNOSIS — L97309 Non-pressure chronic ulcer of unspecified ankle with unspecified severity: Secondary | ICD-10-CM | POA: Diagnosis not present

## 2014-02-03 ENCOUNTER — Encounter (HOSPITAL_COMMUNITY): Payer: Self-pay | Admitting: Emergency Medicine

## 2014-02-03 ENCOUNTER — Emergency Department (HOSPITAL_COMMUNITY): Payer: Medicare Other

## 2014-02-03 ENCOUNTER — Emergency Department (HOSPITAL_COMMUNITY)
Admission: EM | Admit: 2014-02-03 | Discharge: 2014-02-03 | Disposition: A | Discharge status: Home / Self Care | Payer: Medicare Other | Attending: Emergency Medicine | Admitting: Emergency Medicine

## 2014-02-03 DIAGNOSIS — M810 Age-related osteoporosis without current pathological fracture: Secondary | ICD-10-CM | POA: Diagnosis not present

## 2014-02-03 DIAGNOSIS — Z7982 Long term (current) use of aspirin: Secondary | ICD-10-CM | POA: Diagnosis not present

## 2014-02-03 DIAGNOSIS — K219 Gastro-esophageal reflux disease without esophagitis: Secondary | ICD-10-CM | POA: Insufficient documentation

## 2014-02-03 DIAGNOSIS — W010XXA Fall on same level from slipping, tripping and stumbling without subsequent striking against object, initial encounter: Secondary | ICD-10-CM | POA: Diagnosis not present

## 2014-02-03 DIAGNOSIS — E039 Hypothyroidism, unspecified: Secondary | ICD-10-CM | POA: Diagnosis not present

## 2014-02-03 DIAGNOSIS — M1990 Unspecified osteoarthritis, unspecified site: Secondary | ICD-10-CM | POA: Diagnosis not present

## 2014-02-03 DIAGNOSIS — IMO0002 Reserved for concepts with insufficient information to code with codable children: Secondary | ICD-10-CM | POA: Diagnosis not present

## 2014-02-03 DIAGNOSIS — S0180XA Unspecified open wound of other part of head, initial encounter: Secondary | ICD-10-CM | POA: Diagnosis not present

## 2014-02-03 DIAGNOSIS — M949 Disorder of cartilage, unspecified: Secondary | ICD-10-CM

## 2014-02-03 DIAGNOSIS — Y9301 Activity, walking, marching and hiking: Secondary | ICD-10-CM | POA: Diagnosis not present

## 2014-02-03 DIAGNOSIS — I10 Essential (primary) hypertension: Secondary | ICD-10-CM | POA: Insufficient documentation

## 2014-02-03 DIAGNOSIS — W19XXXA Unspecified fall, initial encounter: Secondary | ICD-10-CM

## 2014-02-03 DIAGNOSIS — E538 Deficiency of other specified B group vitamins: Secondary | ICD-10-CM | POA: Insufficient documentation

## 2014-02-03 DIAGNOSIS — S0990XA Unspecified injury of head, initial encounter: Secondary | ICD-10-CM | POA: Diagnosis present

## 2014-02-03 DIAGNOSIS — D649 Anemia, unspecified: Secondary | ICD-10-CM | POA: Insufficient documentation

## 2014-02-03 DIAGNOSIS — J4489 Other specified chronic obstructive pulmonary disease: Secondary | ICD-10-CM | POA: Insufficient documentation

## 2014-02-03 DIAGNOSIS — M899 Disorder of bone, unspecified: Secondary | ICD-10-CM | POA: Diagnosis not present

## 2014-02-03 DIAGNOSIS — J449 Chronic obstructive pulmonary disease, unspecified: Secondary | ICD-10-CM | POA: Diagnosis not present

## 2014-02-03 DIAGNOSIS — S0181XA Laceration without foreign body of other part of head, initial encounter: Secondary | ICD-10-CM

## 2014-02-03 DIAGNOSIS — I509 Heart failure, unspecified: Secondary | ICD-10-CM | POA: Insufficient documentation

## 2014-02-03 DIAGNOSIS — Y92009 Unspecified place in unspecified non-institutional (private) residence as the place of occurrence of the external cause: Secondary | ICD-10-CM | POA: Diagnosis not present

## 2014-02-03 DIAGNOSIS — Z79899 Other long term (current) drug therapy: Secondary | ICD-10-CM | POA: Diagnosis not present

## 2014-02-03 HISTORY — DX: Repeated falls: R29.6

## 2014-02-03 MED ORDER — LIDOCAINE-EPINEPHRINE 1 %-1:100000 IJ SOLN
10.0000 mL | Freq: Once | INTRAMUSCULAR | Status: AC
  Administered 2014-02-03: 5 mL
  Filled 2014-02-03: qty 10

## 2014-02-03 NOTE — ED Notes (Signed)
Per [facility]- Pt resides at home. Lives with daughter and husband. Pt with HX of falls and leg weakness. Pressure stockings to legs. Pt at on floor for approx. 30 minutes. Pt was found up against kitchen cabinet. Pt states NO LOC. Pt denies no neck or back pain. Family states at baseline. Pt and family states on complaint is slight soreness to abrasion to insult to head. Bleeding controlled upon EMS arrival. First aid applied by EMS. NO N/V, headache or blurred vision. HX of cataracts blurred in left eye.

## 2014-02-03 NOTE — ED Notes (Signed)
MD at bedside. EDP MCMANUS PRESENT TO SPEAK TO PT AND FAMILY

## 2014-02-03 NOTE — Discharge Instructions (Signed)
°Emergency Department Resource Guide °1) Find a Doctor and Pay Out of Pocket °Although you won't have to find out who is covered by your insurance plan, it is a good idea to ask around and get recommendations. You will then need to call the office and see if the doctor you have chosen will accept you as a new patient and what types of options they offer for patients who are self-pay. Some doctors offer discounts or will set up payment plans for their patients who do not have insurance, but you will need to ask so you aren't surprised when you get to your appointment. ° °2) Contact Your Local Health Department °Not all health departments have doctors that can see patients for sick visits, but many do, so it is worth a call to see if yours does. If you don't know where your local health department is, you can check in your phone book. The CDC also has a tool to help you locate your state's health department, and many state websites also have listings of all of their local health departments. ° °3) Find a Walk-in Clinic °If your illness is not likely to be very severe or complicated, you may want to try a walk in clinic. These are popping up all over the country in pharmacies, drugstores, and shopping centers. They're usually staffed by nurse practitioners or physician assistants that have been trained to treat common illnesses and complaints. They're usually fairly quick and inexpensive. However, if you have serious medical issues or chronic medical problems, these are probably not your best option. ° °No Primary Care Doctor: °- Call [facility] at  832-8000 - they can help you locate a primary care doctor that  accepts your insurance, provides certain services, etc. °- Physician Referral Service- 1-800-533-3463 ° °Chronic Pain Problems: °Organization         Address  Phone   Notes  °[facility]  (336) 297-2271 Patients need to be referred by their primary care doctor.  ° °Medication  Assistance: °Organization         Address  Phone   Notes  °[place] [facility] [address] °[place], [place] [address] (336) 641-8030 --Must be a resident of [place] °-- Must have NO insurance coverage whatsoever (no Medicaid/ Medicare, etc.) °-- The pt. MUST have a primary care doctor that directs their care regularly and follows them in the community °  °[facility]  (866) 331-1348   °[employer]  (888) 892-1162   ° °Agencies that provide inexpensive medical care: °Organization         Address  Phone   Notes  °[facility]  (336) 832-8035   °[facility]    (336) 832-7272   °[facility] [address] °[place],  [address] (336) 832-4777   °[facility] [address], °[place] (336) 271-4999   °[facility]    (336) 373-0678   °[facility]    (336) 272-1050   °[facility] ° [address], [place] Phone:  (336) 832-4444, Fax:  (336) 832-4440 Hours of Operation:  9 am - 6 pm, M-F.  Also accepts Medicaid/Medicare and self-pay.  °[facility] ° [address], [place] Phone: (336) 832-3150, Fax: (336) 832-3151. Hours of Operation:  8:30 am - 5:30 pm, M-F.  Also accepts Medicaid and self-pay.  °[facility] [place] [address], [place] Phone: (336) 878-6027   °[facility] [address], [place], [place] (336)723-1848, Ext. 123 Mondays & Thursdays: 7-9 AM.  First 15 patients are seen on a first come, first serve basis. °  ° °Medicaid-accepting [place] Providers: ° °Organization         Address  Phone   Notes  °[facility] [address], [place] (336) 641-2100 Also accepts self-pay patients.  °[facility] [address], [place] ° (336) 856-9996   °[facility] [address], [place]  (336) 288-8857   °[facility] [address] [place] [address], [place] (336) 299-7000   °Veita Bland [address], [place]  ° (336) 373-1557 Only accepts [place] Access Medicaid patients after they have their name applied to their card.  ° °Self-Pay (no insurance) in [place]: ° °Organization         Address  Phone   Notes  °Sickle Cell Patients, [facility] [address], [place] (336) 832-1970   °[facility] [address], [place] (336) 832-4400   °[facility] ° [address], [place] (336) 992-4800   °[facility]/Dr. Osei-Bonsu ° [address] [place] [address], [place] or [address], [place] (336) 841-8500 Phone number for both [place] and [place] locations is the same.  °[facility] [address], [place] (336) 299-0000   °[facility] [address] [place] [address], [place] or [address] (336) 852-7530 °(336) 878-2260   °[facility] [address], [place] (336) 350-1642, phone; (336) 294-5005, fax Sees patients 1st and 3rd Saturday of every month.  Must not qualify for public or private insurance (i.e. Medicaid, Medicare, [place] Health Choice, Veterans' Benefits) • Household income should be no more than 200% of the poverty level •The clinic cannot treat you if you are pregnant or think you are pregnant • Sexually transmitted diseases are not treated at the clinic.  ° ° °Dental Care: °Organization         Address  Phone  Notes  °[place] [facility] [address], [place] (336) 641-6152 Accepts children up to age 21 who are enrolled in Medicaid or [place] Health Choice; pregnant women with a Medicaid card; and children who have applied for Medicaid or [place] Health Choice, but were declined, whose parents can pay a reduced fee at time of service.  °[place]  [facility] [place]  [address], [place] (336) 641-7733 Accepts children up to age 21 who are enrolled in Medicaid or [place] Health Choice; pregnant women with a Medicaid card; and children who have applied for Medicaid or [place] Health Choice, but were declined, whose parents can pay a reduced fee at time of service.  °[facility] ° [address], [place] (336) 641-4533 Patients are seen by appointment only. Walk-ins are not accepted. [facility] will see patients 18 years of age and older. °Monday - Tuesday (8am-5pm) °Most Wednesdays (8:30-5pm) °$30 per visit, cash only  °[facility] ° [address], [place] (336) 641-4533 Patients are seen by appointment only. Walk-ins are not accepted. [facility] will see patients 18 years of age and older. °One   Wednesday Evening (Monthly: Volunteer Based).  $30 per visit, cash only  °[facility]  (919) 537-3737 for adults; Children under age 4, call Graduate Pediatric Dentistry at ([phone]) [phone]. Children aged 4-14, please call (919) [phone] to request a pediatric application. ° Dental services are provided in all areas of dental care including fillings, crowns and bridges, complete and partial dentures, implants, gum treatment, root canals, and extractions. Preventive care is also provided. Treatment is provided to both adults and children. °Patients are selected via a lottery and there is often a waiting list. °  °[facility] [address], °[place] ° (336) 763-8833 [email or web address].[email or web address] °  °[facility] [address], [place], [place] (336)723-1848, Ext. 123 Second and Fourth Thursday of each month, opens at 6:30 AM; Clinic ends at 9 AM.  Patients are seen on a first-come first-served basis, and a limited number are seen during each clinic.  ° °[facility] ° [address], [place], [place] (336) 723-7904    Eligibility Requirements °You must have lived in [place], [place], or [place] counties for at least the last three months. °  You cannot be eligible for state or federal sponsored healthcare insurance, including Veterans Administration, Medicaid, or Medicare. °  You generally cannot be eligible for healthcare insurance through your employer.  °  How to apply: °Eligibility screenings are held every Tuesday and Wednesday afternoon from 1:00 pm until 4:00 pm. You do not need an appointment for the interview!  °[facility] [address], [place], [place] 336-631-2330   °[facility]  336-342-8273   °[facility]  336-703-3100   °[facility]  336-570-6415   ° °Behavioral Health Resources in the Community: °Intensive Outpatient Programs °Organization         Address  Phone  Notes  °[facility] [address], [place], [place] 336-878-6098   °[facility] [address], [place], [place] 336-832-9800   °[facility] [address], [place], [place] ° 336-882-2125   °[facility] [address],  °[place], [place] 1-800-853-5163 or 336-641-4981   °Substance Abuse Resources °Organization         Address  Phone  Notes  °[facility]  336-882-2125   °[facility]  336-784-9470   °[facility]  336-285-9073   °[facility]  336-845-3988   °Residential & Outpatient Substance Abuse Program  1-800-659-3381   °Psychological Services °Organization         Address  Phone  Notes  °[facility]  336- 832-9600   °[facility]  336- 378-7881   °[facility] [address], [place] 1-800-853-5163 or 336-641-4981   ° °Mobile Crisis Teams °Organization         Address  Phone  Notes  °[facility], Mobile Crisis Care Unit  1-877-626-1772   °Assertive °[facility] ° [address]  [place], [place] 336-834-9664   °Sharon DeEsch [address] °[place] 336-554-5454   ° °Self-Help/Support Groups °Organization         Address  Phone             Notes  °[facility] [place] - variety of support groups  336- 373-1402 Call for more information  °Narcotics Anonymous (NA), [facility] [address], °[place]  2 meetings at this location  ° °  Residential Treatment Programs Organization         Address  Phone  Notes  [facility] [address]    [place]  1-920-590-1221   [facility]  [address] [place] [address], [place], [place]   [place], [place] 831-748-5181 Admissions: 8am-3pm M-F  [facility] [address]    [place], [place] 914-782-9562   [facility] [address] [place], [place], [place]   [facility] [address],  [place], [place]   [facility] - Intensive Outpatient [address], [place], [place]   [facility] ([address]  [place], [place] 1-760-624-2790 or 716-765-4766   [facility] [address], [place], [place] Accepts Medicaid  [facility] [address],  [place] 1-417-473-8163 Substance Abuse/Addiction Treatment   [facility]         Address  Phone  Notes  [facility]  408-738-8001   Domenic Schwab, PhD [address] [place], [place]   437-534-2767 or 647-147-7576   [facility] [place], [place] 613-574-9901   [facility] [address], [place], [place] [phone] Insurance/Medicaid/sponsorship through [facility] and Families [address]                                    [place], [place] [phone] [place] [address], [place] (873)[phone]    Dr. Adele Schilder  208-465-4128   [facility] Dept. 1) [address] [place] 2) [place] 3)  [place] [address], [place] ([phone]  [phone]   [place] (316)415-3448 or [phone] (After Hours)      Take your usual prescriptions as previously directed.  Wash the area with soap and water at least twice a day, and cover with a clean/dry dressing.  Change the dressing whenever it becomes wet or soiled after washing the area with soap and water.  Call your regular medical doctor tomorrow to schedule a follow up appointment for a recheck within the next 3 to 5 days for a wound recheck and suture removal.  Return to the Emergency Department immediately if worsening.

## 2014-02-03 NOTE — ED Provider Notes (Signed)
Patient seen by Dr Thurnell Garbe.  I was asked to see patient  for wound repair.    LACERATION REPAIR Performed by: Clayton Bibles   Performed by Rockwell Germany, PA-S, under my direct supervision.  Authorized by: Clayton Bibles Consent: Verbal consent obtained. Risks and benefits: risks, benefits and alternatives were discussed Consent given by: patient Patient identity confirmed: provided demographic data Prepped and Draped in normal sterile fashion Wound explored  Laceration Location: left forehead   Laceration Length: 2cm  No Foreign Bodies seen or palpated  Anesthesia: local infiltration  Local anesthetic: lidocaine 2% with epinephrine  Anesthetic total: 4 ml  Irrigation method: syringe Amount of cleaning: standard  Skin closure: 5-0 ethilon  Number of sutures: 4  Technique: simple interrupted  Patient tolerance: Patient tolerated the procedure well with no immediate complications.   [place], PA-C 02/04/14 0040

## 2014-02-03 NOTE — ED Provider Notes (Signed)
CSN: [id number]     Arrival date & time 02/03/14  1511 History   First MD Initiated Contact with Patient 02/03/14 1549     Chief Complaint  Patient presents with  . Fall  . Head Laceration    SKIN TEAR TO FOREHEAD NICKEL SIZE      HPI Pt was seen at 1555. Per EMS, family and pt report, c/o sudden onset and resolution of one episode and slip and fall at home PTA. Pt's family states pt often "tries to walk without her walker" and subsequently falls. Pt's family found her sitting on the kitchen floor. State pt "probably hit her head on the cabinet when she fell." Pt's only complaint is "soreness" at her left forehead laceration. Pt denies syncope, no CP/SOB, no abd pain, no neck or back pain, no focal motor weakness, no tingling/numbness in extremities.    Past Medical History  Diagnosis Date  . HYPOTHYROIDISM 10/11/2006  . VITAMIN B12 DEFICIENCY 10/11/2006  . HYPONATREMIA 07/23/2007  . ANEMIA-NOS 09/14/2007  . HYPERTENSION 10/11/2006  . AORTIC STENOSIS 02/23/2007  . LEFT BUNDLE BRANCH BLOCK 10/11/2006  . CONGESTIVE HEART FAILURE 07/23/2007  . COPD 07/23/2007  . OSTEOPOROSIS 02/23/2007  . OSTEOPENIA 10/11/2006  . SAH (subarachnoid hemorrhage) 09/29/2010  . SDH (subdural hematoma) 09/29/2010  . GERD (gastroesophageal reflux disease) 09/29/2010  . DJD (degenerative joint disease) 09/29/2010  . Frequent falls    Past Surgical History  Procedure Laterality Date  . Tonsillectomy    . Abdominal hysterectomy    . Cataract surgery    . Appendectomy     Family History  Problem Relation Age of Onset  . Thyroid disease Mother   . Thyroid disease Maternal Grandmother   . Cancer Other     breast  . Diabetes Other    History  Substance Use Topics  . Smoking status: Never Smoker   . Smokeless tobacco: Not on file  . Alcohol Use: No    Review of Systems ROS: Statement: All systems negative except as marked or noted in the HPI; Constitutional: Negative for fever and chills. ; ; Eyes: Negative for  eye pain, redness and discharge. ; ; ENMT: Negative for ear pain, hoarseness, nasal congestion, sinus pressure and sore throat. ; ; Cardiovascular: Negative for chest pain, palpitations, diaphoresis, dyspnea and peripheral edema. ; ; Respiratory: Negative for cough, wheezing and stridor. ; ; Gastrointestinal: Negative for nausea, vomiting, diarrhea, abdominal pain, blood in stool, hematemesis, jaundice and rectal bleeding. . ; ; Genitourinary: Negative for dysuria, flank pain and hematuria. ; ; Musculoskeletal: Negative for back pain and neck pain. Negative for swelling and deformity..; ; Skin: +forehead laceration. Negative for pruritus, rash, abrasions, blisters, bruising and skin lesion.; ; Neuro: Negative for headache, lightheadedness and neck stiffness. Negative for weakness, altered level of consciousness , altered mental status, extremity weakness, paresthesias, involuntary movement, seizure and syncope.      Allergies  Cephalexin; Doxycycline hyclate; and Sulfamethoxazole-trimethoprim  Home Medications   Prior to Admission medications   Medication Sig Start Date End Date Taking? Authorizing Provider  acetaminophen (TYLENOL) 325 MG tablet Take 650 mg by mouth every 6 (six) hours as needed for moderate pain.   Yes Historical Provider, MD  Bioflavonoid Products (ESTER C PO) Take 1,000 mg by mouth daily.    Yes Historical Provider, MD  budesonide-formoterol (SYMBICORT) 160-4.5 MCG/ACT inhaler Inhale 2 puffs into the lungs 2 (two) times daily. 11/22/13  Yes Biagio Borg, MD  calcitRIOL (ROCALTROL) 0.25 MCG capsule Take  0.25 mcg by mouth daily.   Yes Historical Provider, MD  calcium carbonate (OS-CAL) 600 MG TABS Take 600 mg by mouth daily.    Yes Historical Provider, MD  collagenase (SANTYL) ointment Apply 1 application topically once a week.   Yes Historical Provider, MD  furosemide (LASIX) 20 MG tablet Take 1 tab by mouth per day 01/08/14  Yes Biagio Borg, MD  levothyroxine (SYNTHROID,  LEVOTHROID) 75 MCG tablet Take 75 mcg by mouth daily before breakfast.   Yes Historical Provider, MD  Multiple Vitamin (MULITIVITAMIN WITH MINERALS) TABS Take 1 tablet by mouth daily.   Yes Historical Provider, MD  omeprazole (PRILOSEC) 20 MG capsule Take 20 mg by mouth daily.   Yes Historical Provider, MD  potassium chloride SA (K-DUR,KLOR-CON) 20 MEQ tablet Take 20 mEq by mouth 2 (two) times daily.   Yes Historical Provider, MD  predniSONE (DELTASONE) 10 MG tablet Take 10 mg by mouth daily. 01/20/14  Yes Historical Provider, MD  PROAIR HFA 108 (90 BASE) MCG/ACT inhaler inhale 2 puffs by mouth every 6 hours if needed for wheezing 09/12/13  Yes Biagio Borg, MD  traMADol-acetaminophen [facility]) 37.5-325 MG per tablet take 1 tablet by mouth every 6 hours if needed for pain 08/22/13  Yes Biagio Borg, MD  triamcinolone cream (KENALOG) 0.1 % Apply 1 application topically 2 (two) times daily as needed. Statis derm--wound   Yes    SpO2 97% Physical Exam 1600: Physical examination:  Nursing notes reviewed; Vital signs and O2 SAT reviewed;  Constitutional: Thin frail. Well hydrated, In no acute distress; Head:  Normocephalic, +0JJ gaping laceration to left proximal forehead, hemostatic..; Eyes: EOMI, PERRL, No scleral icterus; ENMT: Mouth and pharynx normal, Mucous membranes moist; Neck: Supple, Full range of motion, No lymphadenopathy; Cardiovascular: Regular rate and rhythm, No gallop; Respiratory: Breath sounds clear & equal bilaterally, No wheezes.  Speaking full sentences with ease, Normal respiratory effort/excursion; Chest: Nontender, Movement normal; Abdomen: Soft, Nontender, Nondistended, Normal bowel sounds; Genitourinary: No CVA tenderness; Spine:  No midline CS, TS, LS tenderness.;; Extremities: Pulses normal, Pelvis stable. No tenderness, No edema, No calf edema or asymmetry.; Neuro: Awake, alert, vague historian. +HOH. No facial droop. Speech clear. Moves extremities spontaneously and to command  without apparent gross focal motor deficits.; Skin: Color normal, Warm, Dry.   ED Course  Procedures     MDM  MDM Reviewed: previous chart, nursing note and vitals Reviewed previous: CT scan Interpretation: CT scan    Ct Head Wo Contrast 02/03/2014   CLINICAL DATA:  Pain post trauma  EXAM: CT HEAD WITHOUT CONTRAST  CT CERVICAL SPINE WITHOUT CONTRAST  TECHNIQUE: Multidetector CT imaging of the head and cervical spine was performed following the standard protocol without intravenous contrast. Multiplanar CT image reconstructions of the cervical spine were also generated.  COMPARISON:  None.  FINDINGS: CT HEAD FINDINGS  There is moderate diffuse atrophy. There is no mass, hemorrhage, extra-axial fluid collection, or midline shift. There is patchy small vessel disease in the centra semiovale bilaterally. Elsewhere gray-white compartments appear normal. No acute infarct is apparent on this study.  The bony calvarium appears intact. The mastoid air cells are clear. There is calcification along the retina on the left as well as calcification more anteriorly in the orbit, consistent with previous retinal detachment.  CT CERVICAL SPINE FINDINGS  There is no appreciable acute fracture. There is evidence of remodeling on the left at C1-2 as well as in portions of the odontoid which may represent  residua of previous trauma with healing. There is anterolisthesis of C6 on C7 anterolisthesis of C7 on T1, probably due to chronic spondylosis. No other spondylolisthesis is seen. The prevertebral soft tissues and predental space regions are normal.  There is osteoarthritic change to varying degrees at all levels. Disc narrowing is greatest at C2-3, C3-4, C5-6, and C6-7. There is facet osteoarthritic change at all levels bilaterally. There is no frank disc extrusion or stenosis. Exit foraminal narrowing is most marked at C4-5 on the right.  There is extensive carotid artery calcification bilaterally.  IMPRESSION: CT  head: Atrophy with small vessel disease. No intracranial mass, hemorrhage, or extra-axial fluid. Evidence suggesting previous retinal detachment on the left.  CT cervical spine: No acute fracture is apparent. Suspect old trauma at C1 to on the left and involving the odontoid with remodeling. There is spondylolisthesis at C6-7 and C7-T1 which is felt to be due to underlying spondylosis. Note that the prevertebral soft tissues are normal. There is widespread facet arthropathy at essentially all levels. Exit foraminal narrowing is most marked at C4-5 on the right. There is extensive carotid artery calcification bilaterally.   Electronically Signed   By: Lowella Grip M.D.   On: 02/03/2014 16:57   Ct Cervical Spine Wo Contrast 02/03/2014   CLINICAL DATA:  Pain post trauma  EXAM: CT HEAD WITHOUT CONTRAST  CT CERVICAL SPINE WITHOUT CONTRAST  TECHNIQUE: Multidetector CT imaging of the head and cervical spine was performed following the standard protocol without intravenous contrast. Multiplanar CT image reconstructions of the cervical spine were also generated.  COMPARISON:  None.  FINDINGS: CT HEAD FINDINGS  There is moderate diffuse atrophy. There is no mass, hemorrhage, extra-axial fluid collection, or midline shift. There is patchy small vessel disease in the centra semiovale bilaterally. Elsewhere gray-white compartments appear normal. No acute infarct is apparent on this study.  The bony calvarium appears intact. The mastoid air cells are clear. There is calcification along the retina on the left as well as calcification more anteriorly in the orbit, consistent with previous retinal detachment.  CT CERVICAL SPINE FINDINGS  There is no appreciable acute fracture. There is evidence of remodeling on the left at C1-2 as well as in portions of the odontoid which may represent residua of previous trauma with healing. There is anterolisthesis of C6 on C7 anterolisthesis of C7 on T1, probably due to chronic  spondylosis. No other spondylolisthesis is seen. The prevertebral soft tissues and predental space regions are normal.  There is osteoarthritic change to varying degrees at all levels. Disc narrowing is greatest at C2-3, C3-4, C5-6, and C6-7. There is facet osteoarthritic change at all levels bilaterally. There is no frank disc extrusion or stenosis. Exit foraminal narrowing is most marked at C4-5 on the right.  There is extensive carotid artery calcification bilaterally.  IMPRESSION: CT head: Atrophy with small vessel disease. No intracranial mass, hemorrhage, or extra-axial fluid. Evidence suggesting previous retinal detachment on the left.  CT cervical spine: No acute fracture is apparent. Suspect old trauma at C1 to on the left and involving the odontoid with remodeling. There is spondylolisthesis at C6-7 and C7-T1 which is felt to be due to underlying spondylosis. Note that the prevertebral soft tissues are normal. There is widespread facet arthropathy at essentially all levels. Exit foraminal narrowing is most marked at C4-5 on the right. There is extensive carotid artery calcification bilaterally.   Electronically Signed   By: Lowella Grip M.D.   On: 02/03/2014 16:57  1715:  Forehead laceration closed by midlevel. CT scans without acute abnormality s/p fall. Family would like to take pt home now. Dx and testing d/w pt and family.  Questions answered.  Verb understanding, agreeable to d/c home with outpt f/u.   Francine Graven, DO 02/05/14 1747

## 2014-02-04 ENCOUNTER — Encounter: Payer: Self-pay | Admitting: Internal Medicine

## 2014-02-04 NOTE — Telephone Encounter (Signed)
Ok to re-schedule appt to thur later this wk, thanks

## 2014-02-05 ENCOUNTER — Ambulatory Visit: Payer: Medicare Other | Admitting: Internal Medicine

## 2014-02-05 NOTE — ED Provider Notes (Signed)
Medical procedure only (laceration repair) was performed by non-physician practitioner. I personally evaluated pt. Please see my previous note.     Francine Graven, DO 02/05/14 [phone]

## 2014-02-06 ENCOUNTER — Encounter: Payer: Self-pay | Admitting: Internal Medicine

## 2014-02-06 ENCOUNTER — Ambulatory Visit (INDEPENDENT_AMBULATORY_CARE_PROVIDER_SITE_OTHER): Payer: Medicare Other | Admitting: Internal Medicine

## 2014-02-06 VITALS — BP 133/62 | HR 93 | Temp 98.0°F | Wt 92.4 lb

## 2014-02-06 DIAGNOSIS — J4489 Other specified chronic obstructive pulmonary disease: Secondary | ICD-10-CM

## 2014-02-06 DIAGNOSIS — T148XXA Other injury of unspecified body region, initial encounter: Secondary | ICD-10-CM

## 2014-02-06 DIAGNOSIS — IMO0002 Reserved for concepts with insufficient information to code with codable children: Secondary | ICD-10-CM

## 2014-02-06 DIAGNOSIS — J449 Chronic obstructive pulmonary disease, unspecified: Secondary | ICD-10-CM

## 2014-02-06 DIAGNOSIS — I10 Essential (primary) hypertension: Secondary | ICD-10-CM

## 2014-02-06 DIAGNOSIS — R634 Abnormal weight loss: Secondary | ICD-10-CM

## 2014-02-06 MED ORDER — PREDNISONE 5 MG PO TABS: 5.0000 mg | ORAL_TABLET | Freq: Every day | ORAL | Status: DC

## 2014-02-06 NOTE — Patient Instructions (Signed)
OK to decrease the prednisone to 5 mg per day  OK to stop the calcium supplements  The stitch was removed today  Please continue all other medications as before, and refills have been done if requested.  Please have the pharmacy call with any other refills you may need.  Please keep your appointments with your specialists as you may have planned

## 2014-02-06 NOTE — Progress Notes (Signed)
Pre visit review using our clinic review tool, if applicable. No additional management support is needed unless otherwise documented below in the visit note. 

## 2014-02-06 NOTE — Progress Notes (Signed)
Subjective:    Patient ID: Theresa Hill, female    DOB: 04-01-1916, 80 y.o.   MRN: [id number]  HPI  Here to f/u with daughter, overall doing well on the low dose prednisone - Pt denies chest pain, increased sob or doe, wheezing, orthopnea, PND, increased LE swelling, palpitations, dizziness or syncope. Did trip and fall with left forehead abrasion/s,small laceration 3 stitches, now for out today.  Is taking calcium supplements.   Pt denies polydipsia, polyuria.   Pt denies fever, night sweats, loss of appetite, or other constitutional symptoms, though has lost another 5 lbs in past month. Denies worsening reflux, abd pain, dysphagia, n/v, bowel change or blood.  Denies worsening depressive symptoms, suicidal ideation, or panic  Wt Readings from Last 3 Encounters:  02/06/14 92 lb 6 oz (41.901 kg)  01/08/14 97 lb (43.999 kg)  11/22/13 101 lb (45.813 kg)  /pchx Current Outpatient Prescriptions on File Prior to Visit  Medication Sig Dispense Refill  . acetaminophen (TYLENOL) 325 MG tablet Take 650 mg by mouth every 6 (six) hours as needed for moderate pain.      Marland Kitchen Bioflavonoid Products (ESTER C PO) Take 1,000 mg by mouth daily.       . budesonide-formoterol (SYMBICORT) 160-4.5 MCG/ACT inhaler Inhale 2 puffs into the lungs 2 (two) times daily.  1 Inhaler  12  . calcitRIOL (ROCALTROL) 0.25 MCG capsule Take 0.25 mcg by mouth daily.      . collagenase (SANTYL) ointment Apply 1 application topically once a week.      . furosemide (LASIX) 20 MG tablet Take 1 tab by mouth per day  450 tablet  3  . levothyroxine (SYNTHROID, LEVOTHROID) 75 MCG tablet Take 75 mcg by mouth daily before breakfast.      . Multiple Vitamin (MULITIVITAMIN WITH MINERALS) TABS Take 1 tablet by mouth daily.      Marland Kitchen omeprazole (PRILOSEC) 20 MG capsule Take 20 mg by mouth daily.      . potassium chloride SA (K-DUR,KLOR-CON) 20 MEQ tablet Take 20 mEq by mouth 2 (two) times daily.      Marland Kitchen PROAIR HFA 108 (90 BASE) MCG/ACT inhaler  inhale 2 puffs by mouth every 6 hours if needed for wheezing  8.5 g  11  . traMADol-acetaminophen (ULTRACET) 37.5-325 MG per tablet take 1 tablet by mouth every 6 hours if needed for pain  120 tablet  1  . triamcinolone cream (KENALOG) 0.1 % Apply 1 application topically 2 (two) times daily as needed. Statis derm--wound       No current facility-administered medications on file prior to visit.   Review of Systems  Constitutional: Negative for unusual diaphoresis or other sweats  HENT: Negative for ringing in ear Eyes: Negative for double vision or worsening visual disturbance.  Respiratory: Negative for choking and stridor.   Gastrointestinal: Negative for vomiting or other signifcant bowel change Genitourinary: Negative for hematuria or decreased urine volume.  Musculoskeletal: Negative for other MSK pain or swelling Skin: Negative for color change and worsening wound.  Neurological: Negative for tremors and numbness other than noted  Psychiatric/Behavioral: Negative for decreased concentration or agitation other than above       Objective:   Physical Exam BP 133/62  Pulse 93  Temp(Src) 98 F (36.7 C) (Oral)  Wt 92 lb 6 oz (41.901 kg)  SpO2 93% VS noted, frail, thinner Constitutional: Pt appears well-developed, well-nourished.  HENT: Head: NCAT.  Right Ear: External ear normal.  Left Ear: External ear  normal.  Eyes: . Pupils are equal, round, and reactive to light. Conjunctivae and EOM are normal Neck: Normal range of motion. Neck supple.  Cardiovascular: Normal rate and regular rhythm.   Pulmonary/Chest: Effort normal and breath sounds normal.  - no rales or wheezing Neurological: Pt is alert. Not confused , motor grossly intact Skin: Skin is warm. Left forehead abrasion/small laceration without cellulitis, wound intact Psychiatric: Pt behavior is normal. No agitation.     Assessment & Plan:

## 2014-02-08 DIAGNOSIS — IMO0002 Reserved for concepts with insufficient information to code with codable children: Secondary | ICD-10-CM | POA: Insufficient documentation

## 2014-02-08 NOTE — Assessment & Plan Note (Signed)
Small, 3 stitches out today, intact wound and no cellulitis,  to f/u any worsening symptoms or concerns

## 2014-02-08 NOTE — Assessment & Plan Note (Signed)
Encouraged po intake,  to f/u any worsening symptoms or concerns

## 2014-02-08 NOTE — Assessment & Plan Note (Signed)
To d/c calcium supplements, f/u lab next visit

## 2014-02-08 NOTE — Assessment & Plan Note (Signed)
stable overall by history and exam, and pt to continue medical treatment as before,  to f/u any worsening symptoms or concerns, dw pt long term prednisone use risk, to continue as is

## 2014-02-08 NOTE — Assessment & Plan Note (Signed)
stable overall by history and exam, recent data reviewed with pt, and pt to continue medical treatment as before,  to f/u any worsening symptoms or concerns BP Readings from Last 3 Encounters:  02/06/14 133/62  02/03/14 108/56  01/08/14 90/60

## 2014-02-14 ENCOUNTER — Encounter (HOSPITAL_BASED_OUTPATIENT_CLINIC_OR_DEPARTMENT_OTHER): Payer: Medicare Other | Attending: General Surgery

## 2014-02-14 DIAGNOSIS — L97319 Non-pressure chronic ulcer of right ankle with unspecified severity: Secondary | ICD-10-CM | POA: Insufficient documentation

## 2014-02-14 DIAGNOSIS — L97329 Non-pressure chronic ulcer of left ankle with unspecified severity: Secondary | ICD-10-CM | POA: Insufficient documentation

## 2014-02-14 DIAGNOSIS — I872 Venous insufficiency (chronic) (peripheral): Secondary | ICD-10-CM | POA: Diagnosis present

## 2014-02-14 DIAGNOSIS — I87333 Chronic venous hypertension (idiopathic) with ulcer and inflammation of bilateral lower extremity: Secondary | ICD-10-CM | POA: Diagnosis not present

## 2014-02-20 ENCOUNTER — Other Ambulatory Visit: Payer: Self-pay | Admitting: Internal Medicine

## 2014-02-20 NOTE — Telephone Encounter (Signed)
Done hardcopy to robin  

## 2014-02-21 NOTE — Telephone Encounter (Signed)
Faxed hardcopy for Tramadol 37.5-325 to [facility]

## 2014-03-03 ENCOUNTER — Other Ambulatory Visit: Payer: Self-pay | Admitting: Internal Medicine

## 2014-03-07 DIAGNOSIS — L97319 Non-pressure chronic ulcer of right ankle with unspecified severity: Secondary | ICD-10-CM | POA: Diagnosis not present

## 2014-03-07 DIAGNOSIS — L97329 Non-pressure chronic ulcer of left ankle with unspecified severity: Secondary | ICD-10-CM | POA: Diagnosis not present

## 2014-03-07 DIAGNOSIS — I87333 Chronic venous hypertension (idiopathic) with ulcer and inflammation of bilateral lower extremity: Secondary | ICD-10-CM | POA: Diagnosis not present

## 2014-03-28 ENCOUNTER — Encounter (HOSPITAL_BASED_OUTPATIENT_CLINIC_OR_DEPARTMENT_OTHER): Payer: Medicare Other | Attending: General Surgery

## 2014-03-28 DIAGNOSIS — L97919 Non-pressure chronic ulcer of unspecified part of right lower leg with unspecified severity: Secondary | ICD-10-CM | POA: Insufficient documentation

## 2014-03-28 DIAGNOSIS — I87333 Chronic venous hypertension (idiopathic) with ulcer and inflammation of bilateral lower extremity: Secondary | ICD-10-CM | POA: Insufficient documentation

## 2014-03-28 DIAGNOSIS — L97329 Non-pressure chronic ulcer of left ankle with unspecified severity: Secondary | ICD-10-CM | POA: Insufficient documentation

## 2014-03-28 DIAGNOSIS — L97929 Non-pressure chronic ulcer of unspecified part of left lower leg with unspecified severity: Secondary | ICD-10-CM | POA: Insufficient documentation

## 2014-03-28 DIAGNOSIS — L97319 Non-pressure chronic ulcer of right ankle with unspecified severity: Secondary | ICD-10-CM | POA: Insufficient documentation

## 2014-04-04 DIAGNOSIS — I87333 Chronic venous hypertension (idiopathic) with ulcer and inflammation of bilateral lower extremity: Secondary | ICD-10-CM | POA: Diagnosis not present

## 2014-04-04 DIAGNOSIS — L97329 Non-pressure chronic ulcer of left ankle with unspecified severity: Secondary | ICD-10-CM | POA: Diagnosis not present

## 2014-04-04 DIAGNOSIS — L97919 Non-pressure chronic ulcer of unspecified part of right lower leg with unspecified severity: Secondary | ICD-10-CM | POA: Diagnosis not present

## 2014-04-04 DIAGNOSIS — L97929 Non-pressure chronic ulcer of unspecified part of left lower leg with unspecified severity: Secondary | ICD-10-CM | POA: Diagnosis not present

## 2014-04-04 DIAGNOSIS — L97319 Non-pressure chronic ulcer of right ankle with unspecified severity: Secondary | ICD-10-CM | POA: Diagnosis not present

## 2014-04-07 ENCOUNTER — Ambulatory Visit (INDEPENDENT_AMBULATORY_CARE_PROVIDER_SITE_OTHER): Payer: Medicare Other | Admitting: Internal Medicine

## 2014-04-07 ENCOUNTER — Encounter: Payer: Self-pay | Admitting: Internal Medicine

## 2014-04-07 ENCOUNTER — Other Ambulatory Visit (INDEPENDENT_AMBULATORY_CARE_PROVIDER_SITE_OTHER): Payer: Medicare Other

## 2014-04-07 VITALS — BP 124/80 | HR 91 | Temp 98.4°F | Wt 98.3 lb

## 2014-04-07 DIAGNOSIS — J441 Chronic obstructive pulmonary disease with (acute) exacerbation: Secondary | ICD-10-CM

## 2014-04-07 DIAGNOSIS — R0600 Dyspnea, unspecified: Secondary | ICD-10-CM

## 2014-04-07 DIAGNOSIS — Z79899 Other long term (current) drug therapy: Secondary | ICD-10-CM

## 2014-04-07 LAB — BRAIN NATRIURETIC PEPTIDE: PRO B NATRI PEPTIDE: 3866 pg/mL — AB (ref 0.0–100.0)

## 2014-04-07 LAB — CBC WITH DIFFERENTIAL/PLATELET
BASOS PCT: 1.2 % (ref 0.0–3.0)
Basophils Absolute: 0.1 10*3/uL (ref 0.0–0.1)
Eosinophils Absolute: 0.1 10*3/uL (ref 0.0–0.7)
Eosinophils Relative: 1 % (ref 0.0–5.0)
HEMATOCRIT: 37.1 % (ref 36.0–46.0)
HEMOGLOBIN: 11.9 g/dL — AB (ref 12.0–15.0)
LYMPHS ABS: 1.1 10*3/uL (ref 0.7–4.0)
Lymphocytes Relative: 9.2 % — ABNORMAL LOW (ref 12.0–46.0)
MCHC: 32.2 g/dL (ref 30.0–36.0)
MCV: 109.3 fl — ABNORMAL HIGH (ref 78.0–100.0)
Monocytes Absolute: 1.3 10*3/uL — ABNORMAL HIGH (ref 0.1–1.0)
Monocytes Relative: 10.9 % (ref 3.0–12.0)
Neutro Abs: 9.4 10*3/uL — ABNORMAL HIGH (ref 1.4–7.7)
Neutrophils Relative %: 77.7 % — ABNORMAL HIGH (ref 43.0–77.0)
Platelets: 236 10*3/uL (ref 150.0–400.0)
RBC: 3.4 Mil/uL — AB (ref 3.87–5.11)
RDW: 19.6 % — ABNORMAL HIGH (ref 11.5–15.5)
WBC: 12.2 10*3/uL — ABNORMAL HIGH (ref 4.0–10.5)

## 2014-04-07 MED ORDER — AZITHROMYCIN 250 MG PO TABS: ORAL_TABLET | ORAL | Status: DC

## 2014-04-07 NOTE — Progress Notes (Signed)
Pre visit review using our clinic review tool, if applicable. No additional management support is needed unless otherwise documented below in the visit note. 

## 2014-04-07 NOTE — Patient Instructions (Signed)
Please increase the prednisone 10 mg to 1-1/2 pills twice a day with meals until you here otherwise.   Albuterol is a rescue inhaler which should be used as infrequently as possible; it should never be used more than 1-2 puffs every 4 hours.   If it is required more than 2-3 times per week; the asthma is not well controlled. Symbicort a maintenance agent should be considered to control smooth muscle spasm and airway inflammation  and to prevent adverse effects from excess albuterol use.Those adverse effects can include health or life threatening heart rhythm irregularities. Marland Kitchen

## 2014-04-07 NOTE — Progress Notes (Signed)
   Subjective:    Patient ID: Theresa Hill, female    DOB: 1915-10-20, 80 y.o.   MRN: [id number]  HPI   Since 04/02/14 she's had an exacerbation of her COPD after exposure to weather & humidity changes.  Cough has been productive of clear secretions  She also has associated paroxysmal nocturnal dyspnea.  She's been using the albuterol inhaler  every 6 hours at least and sometimes "twice that much" according to her granddaughter  She is also on the Symbicort.  There is no definite purulence secretions; she has no definite upper respiratory tract symptoms  She does have a history of heart failure.  She is on prednisone 5 mg daily.  She's been followed in wound clinic for venous insufficiency and is treated with wrapping of the lower extremities    Review of Systems Frontal headache, facial pain , nasal purulence, dental pain, sore throat , otic pain or otic discharge denied. No fever , chills or sweats.     Objective:   Physical Exam   Positive or pertinent findings include: She is very frail sitting in wheelchair. Her voice is but a whisper. She has upper and lower dental plates There is an incredibly dense cataract, left eye. She has coarse upper airway rhonchi There is marked increased AP diameter of the chest. There is profound curvature of the midthoracic spine. It is almost @ a 90 angle. Gallop cadence is noted with flow murmur. Abdomen is protuberant but nontender. Bowel sounds are decreased Her legs are wrapped. It is not possible to palpate the pedal pulses. Any edema is not appreciable. She has scattered facial keratoses which do not have malignant character clinically.  Eyes: No conjunctival inflammation or scleral icterus is present. Oral exam: Lips and gums are healthy appearing.There is no oropharyngeal erythema or exudate noted.  Lungs:Chest quiet; no wheezes, rhonchi,rales ,or rubs present.Some increased work of breathing @ rest . Vascular : all pulses  equal ; no bruits present. Skin:Warm & dry.  Intact without suspicious lesions or rashes ; no jaundice Lymphatic: No lymphadenopathy is noted about the head, neck, axilla              Assessment & Plan:  #1 bronchitis with dyspnea #2 COPD exacerbation #3 PNDyspnea;R/O CHF component #4 albuterole  See orders

## 2014-04-08 ENCOUNTER — Telehealth: Payer: Self-pay

## 2014-04-08 NOTE — Telephone Encounter (Signed)
Called the family to schedule, but no answer and no voice mail to leave a message.  Will continue to try and contact to schedule.

## 2014-04-08 NOTE — Telephone Encounter (Signed)
-----   Message from Hendricks Limes, MD sent at 04/08/2014  7:10 AM EST ----- OV in 1 week with Dr Jenny Reichmann please

## 2014-04-08 NOTE — Telephone Encounter (Signed)
Patient is scheduled next Wednesday dec. 2, 2015. With PCP.

## 2014-04-14 ENCOUNTER — Encounter: Payer: Self-pay | Admitting: Internal Medicine

## 2014-04-16 ENCOUNTER — Encounter (HOSPITAL_BASED_OUTPATIENT_CLINIC_OR_DEPARTMENT_OTHER): Payer: Medicare Other | Attending: Internal Medicine

## 2014-04-16 ENCOUNTER — Ambulatory Visit (INDEPENDENT_AMBULATORY_CARE_PROVIDER_SITE_OTHER): Payer: Medicare Other | Admitting: Internal Medicine

## 2014-04-16 ENCOUNTER — Encounter: Payer: Self-pay | Admitting: Internal Medicine

## 2014-04-16 VITALS — BP 110/72 | HR 78 | Temp 98.6°F | Wt 99.2 lb

## 2014-04-16 DIAGNOSIS — L97319 Non-pressure chronic ulcer of right ankle with unspecified severity: Secondary | ICD-10-CM | POA: Insufficient documentation

## 2014-04-16 DIAGNOSIS — L97329 Non-pressure chronic ulcer of left ankle with unspecified severity: Secondary | ICD-10-CM | POA: Insufficient documentation

## 2014-04-16 DIAGNOSIS — L97919 Non-pressure chronic ulcer of unspecified part of right lower leg with unspecified severity: Secondary | ICD-10-CM | POA: Diagnosis not present

## 2014-04-16 DIAGNOSIS — I87333 Chronic venous hypertension (idiopathic) with ulcer and inflammation of bilateral lower extremity: Secondary | ICD-10-CM | POA: Insufficient documentation

## 2014-04-16 DIAGNOSIS — I10 Essential (primary) hypertension: Secondary | ICD-10-CM

## 2014-04-16 DIAGNOSIS — J441 Chronic obstructive pulmonary disease with (acute) exacerbation: Secondary | ICD-10-CM

## 2014-04-16 DIAGNOSIS — L989 Disorder of the skin and subcutaneous tissue, unspecified: Secondary | ICD-10-CM | POA: Insufficient documentation

## 2014-04-16 NOTE — Patient Instructions (Signed)
OK to wean the prednisone as you have planned to the 5 mg per day  Please continue all other medications as before, and refills have been done if requested.  Please have the pharmacy call with any other refills you may need  Please keep your appointments with your specialists as you may have planned  No further lab work needed today  You will be contacted regarding the referral for: dermatology

## 2014-04-16 NOTE — Progress Notes (Signed)
Subjective:    Patient ID: ANNA-MARIE COLLER, female    DOB: 01/17/1916, 80 y.o.   MRN: [id number]  HPI  Here to f/u, overall doing well, with less cough, sob, now resolved with pred 30 qd; taking 20 yesterday, and plan is to taper to 5 mg over the next wk. Pt denies chest pain, increased sob or doe, wheezing, orthopnea, PND, increased LE swelling, palpitations, dizziness or syncope. Has some fatigue, but o/w no specific complaints.  Did see wound clinic this am, seems to be progressing slowly but well. Past Medical History  Diagnosis Date  . HYPOTHYROIDISM 10/11/2006  . VITAMIN B12 DEFICIENCY 10/11/2006  . HYPONATREMIA 07/23/2007  . ANEMIA-NOS 09/14/2007  . HYPERTENSION 10/11/2006  . AORTIC STENOSIS 02/23/2007  . LEFT BUNDLE BRANCH BLOCK 10/11/2006  . CONGESTIVE HEART FAILURE 07/23/2007  . COPD 07/23/2007  . OSTEOPOROSIS 02/23/2007  . OSTEOPENIA 10/11/2006  . SAH (subarachnoid hemorrhage) 09/29/2010  . SDH (subdural hematoma) 09/29/2010  . GERD (gastroesophageal reflux disease) 09/29/2010  . DJD (degenerative joint disease) 09/29/2010  . Frequent falls    Past Surgical History  Procedure Laterality Date  . Tonsillectomy    . Abdominal hysterectomy    . Cataract surgery    . Appendectomy      reports that she has never smoked. She does not have any smokeless tobacco history on file. She reports that she does not drink alcohol or use illicit drugs. family history includes Cancer in her other; Diabetes in her other; Thyroid disease in her maternal grandmother and mother. Allergies  Allergen Reactions  . Cephalexin Nausea And Vomiting  . Doxycycline Hyclate Nausea And Vomiting  . Sulfamethoxazole-Trimethoprim Nausea And Vomiting    Only tablet form. IV is OK    Current Outpatient Prescriptions on File Prior to Visit  Medication Sig Dispense Refill  . acetaminophen (TYLENOL) 325 MG tablet Take 650 mg by mouth every 6 (six) hours as needed for moderate pain.    Marland Kitchen azithromycin (ZITHROMAX Z-PAK)  250 MG tablet 2 day 1, then 1 qd 6 tablet 0  . Bioflavonoid Products (ESTER C PO) Take 1,000 mg by mouth daily.     . budesonide-formoterol (SYMBICORT) 160-4.5 MCG/ACT inhaler Inhale 2 puffs into the lungs 2 (two) times daily. 1 Inhaler 12  . calcitRIOL (ROCALTROL) 0.25 MCG capsule take 1 capsule by mouth once daily 90 capsule 3  . collagenase (SANTYL) ointment Apply 1 application topically once a week.    . furosemide (LASIX) 20 MG tablet Take 1 tab by mouth per day 450 tablet 3  . levothyroxine (SYNTHROID, LEVOTHROID) 75 MCG tablet Take 75 mcg by mouth daily before breakfast.    . Multiple Vitamin (MULITIVITAMIN WITH MINERALS) TABS Take 1 tablet by mouth daily.    Marland Kitchen omeprazole (PRILOSEC) 20 MG capsule Take 20 mg by mouth daily.    . potassium chloride SA (K-DUR,KLOR-CON) 20 MEQ tablet Take 20 mEq by mouth 2 (two) times daily.    . predniSONE (DELTASONE) 5 MG tablet Take 1 tablet (5 mg total) by mouth daily with breakfast. 30 tablet 5  . PROAIR HFA 108 (90 BASE) MCG/ACT inhaler inhale 2 puffs by mouth every 6 hours if needed for wheezing 8.5 g 11  . traMADol-acetaminophen (ULTRACET) 37.5-325 MG per tablet take 1 tablet by mouth every 6 hours if needed for pain 120 tablet 1  . triamcinolone cream (KENALOG) 0.1 % Apply 1 application topically 2 (two) times daily as needed. Statis derm--wound     No  current facility-administered medications on file prior to visit.   Review of Systems  Constitutional: Negative for unusual diaphoresis or other sweats  HENT: Negative for ringing in ear Eyes: Negative for double vision or worsening visual disturbance.  Respiratory: Negative for choking and stridor.   Gastrointestinal: Negative for vomiting or other signifcant bowel change Genitourinary: Negative for hematuria or decreased urine volume.  Musculoskeletal: Negative for other MSK pain or swelling Skin: Negative for color change and worsening wound.  Neurological: Negative for tremors and numbness  other than noted  Psychiatric/Behavioral: Negative for decreased concentration or agitation other than above       Objective:   Physical Exam BP 110/72 mmHg  Pulse 78  Temp(Src) 98.6 F (37 C) (Oral)  Wt 99 lb 4 oz (45.02 kg)  SpO2 95% VS noted,  Constitutional: Pt appears well-developed, well-nourished.  HENT: Head: NCAT.  Right Ear: External ear normal.  Left Ear: External ear normal.  Eyes: . Pupils are equal, round, and reactive to light. Conjunctivae and EOM are normal Neck: Normal range of motion. Neck supple.  Cardiovascular: Normal rate and regular rhythm.   Pulmonary/Chest: Effort normal and breath sounds normal.  Abd:  Soft, NT, ND, + BS Neurological: Pt is alert. Not confused , motor grossly intact Skin: Skin is warm. No rash, has  New lesion mid forehead and right paranasal - ? Basal cellPsychiatric: Pt behavior is normal. No agitation.     Assessment & Plan:

## 2014-04-16 NOTE — Assessment & Plan Note (Signed)
For derm referral,  ? Need removal

## 2014-04-16 NOTE — Assessment & Plan Note (Signed)
Improved, ok to wean steroid as planned

## 2014-04-16 NOTE — Progress Notes (Signed)
Pre visit review using our clinic review tool, if applicable. No additional management support is needed unless otherwise documented below in the visit note. 

## 2014-04-16 NOTE — Assessment & Plan Note (Signed)
stable overall by history and exam, recent data reviewed with pt, and pt to continue medical treatment as before,  to f/u any worsening symptoms or concerns BP Readings from Last 3 Encounters:  04/16/14 110/72  04/07/14 124/80  02/06/14 133/62

## 2014-04-25 ENCOUNTER — Encounter (HOSPITAL_BASED_OUTPATIENT_CLINIC_OR_DEPARTMENT_OTHER): Payer: Medicare Other

## 2014-04-28 ENCOUNTER — Ambulatory Visit (INDEPENDENT_AMBULATORY_CARE_PROVIDER_SITE_OTHER): Payer: Medicare Other | Admitting: Cardiology

## 2014-04-28 ENCOUNTER — Encounter: Payer: Self-pay | Admitting: Cardiology

## 2014-04-28 VITALS — BP 94/52 | HR 71 | Ht 60.0 in | Wt 95.0 lb

## 2014-04-28 DIAGNOSIS — I359 Nonrheumatic aortic valve disorder, unspecified: Secondary | ICD-10-CM

## 2014-04-28 DIAGNOSIS — I5032 Chronic diastolic (congestive) heart failure: Secondary | ICD-10-CM

## 2014-04-28 LAB — BASIC METABOLIC PANEL WITH GFR
BUN: 32 mg/dL — AB (ref 6–23)
CALCIUM: 10.2 mg/dL (ref 8.4–10.5)
CHLORIDE: 104 meq/L (ref 96–112)
CO2: 30 meq/L (ref 19–32)
Creat: 0.86 mg/dL (ref 0.50–1.10)
GFR, Est African American: 66 mL/min
GFR, Est Non African American: 57 mL/min — ABNORMAL LOW
GLUCOSE: 78 mg/dL (ref 70–99)
Potassium: 4.2 mEq/L (ref 3.5–5.3)
Sodium: 140 mEq/L (ref 135–145)

## 2014-04-28 LAB — BRAIN NATRIURETIC PEPTIDE: Brain Natriuretic Peptide: 2407.6 pg/mL — ABNORMAL HIGH (ref 0.0–100.0)

## 2014-04-28 NOTE — Progress Notes (Signed)
HPI: FU AS. Echocardiogram in June 2012 showed an ejection fraction of 40%, severe aortic stenosis with a mean gradient of 43 mmHg, mild AI, moderate mitral regurgitation, moderate left atrial enlargement, mild right atrial and right ventricular enlargement. When I saw her previously I explained that she was not a candidate for aortic valve replacement and she and her daughter were also in agreement with that. Did not want to pursue TAVR. She is a no CODE BLUE and we are treating medically and not pursuing followup echocardiograms. Since last seen, she has some dyspnea on exertion unchanged. No orthopnea or PND. No chest pain. She has chronic mild pedal edema that is not changed.  Current Outpatient Prescriptions  Medication Sig Dispense Refill  . acetaminophen (TYLENOL) 325 MG tablet Take 650 mg by mouth every 6 (six) hours as needed for moderate pain.    Marland Kitchen azithromycin (ZITHROMAX Z-PAK) 250 MG tablet 2 day 1, then 1 qd 6 tablet 0  . Bioflavonoid Products (ESTER C PO) Take 1,000 mg by mouth daily.     . budesonide-formoterol (SYMBICORT) 160-4.5 MCG/ACT inhaler Inhale 2 puffs into the lungs 2 (two) times daily. 1 Inhaler 12  . calcitRIOL (ROCALTROL) 0.25 MCG capsule take 1 capsule by mouth once daily 90 capsule 3  . collagenase (SANTYL) ointment Apply 1 application topically once a week.    . furosemide (LASIX) 20 MG tablet Take 1 tab by mouth per day 450 tablet 3  . levothyroxine (SYNTHROID, LEVOTHROID) 75 MCG tablet Take 75 mcg by mouth daily before breakfast.    . Multiple Vitamin (MULITIVITAMIN WITH MINERALS) TABS Take 1 tablet by mouth daily.    Marland Kitchen omeprazole (PRILOSEC) 20 MG capsule Take 20 mg by mouth daily.    . potassium chloride SA (K-DUR,KLOR-CON) 20 MEQ tablet Take 20 mEq by mouth 2 (two) times daily.    . predniSONE (DELTASONE) 5 MG tablet Take 1 tablet (5 mg total) by mouth daily with breakfast. 30 tablet 5  . PROAIR HFA 108 (90 BASE) MCG/ACT inhaler inhale 2 puffs by mouth  every 6 hours if needed for wheezing 8.5 g 11  . traMADol-acetaminophen (ULTRACET) 37.5-325 MG per tablet take 1 tablet by mouth every 6 hours if needed for pain 120 tablet 1  . triamcinolone cream (KENALOG) 0.1 % Apply 1 application topically 2 (two) times daily as needed. Statis derm--wound     No current facility-administered medications for this visit.     Past Medical History  Diagnosis Date  . HYPOTHYROIDISM 10/11/2006  . VITAMIN B12 DEFICIENCY 10/11/2006  . HYPONATREMIA 07/23/2007  . ANEMIA-NOS 09/14/2007  . HYPERTENSION 10/11/2006  . AORTIC STENOSIS 02/23/2007  . LEFT BUNDLE BRANCH BLOCK 10/11/2006  . CONGESTIVE HEART FAILURE 07/23/2007  . COPD 07/23/2007  . OSTEOPOROSIS 02/23/2007  . OSTEOPENIA 10/11/2006  . SAH (subarachnoid hemorrhage) 09/29/2010  . SDH (subdural hematoma) 09/29/2010  . GERD (gastroesophageal reflux disease) 09/29/2010  . DJD (degenerative joint disease) 09/29/2010  . Frequent falls     Past Surgical History  Procedure Laterality Date  . Tonsillectomy    . Abdominal hysterectomy    . Cataract surgery    . Appendectomy      History   Social History  . Marital Status: Widowed    Spouse Name: N/A    Number of Children: 1  . Years of Education: N/A   Occupational History  .     Social History Main Topics  . Smoking status: Never Smoker   .  Smokeless tobacco: Not on file  . Alcohol Use: No  . Drug Use: No  . Sexual Activity: Not on file   Other Topics Concern  . Not on file   Social History Narrative   Lives with daughter.    ROS: no fevers or chills, productive cough, hemoptysis, dysphasia, odynophagia, melena, hematochezia, dysuria, hematuria, rash, seizure activity, orthopnea, PND, pedal edema, claudication. Remaining systems are negative.  Physical Exam: Well-developed frail in no acute distress.  Skin is warm and dry.  HEENT is normal.  Neck is supple.  Chest is clear to auscultation with normal expansion.  Cardiovascular exam is regular  rate and rhythm. 3/6 systolic murmur LSB  Abdominal exam nontender or distended. No masses palpated. Extremities wrapped neuro grossly intact  ECG sinus rhythm at a rate of 71. Left bundle branch block.

## 2014-04-28 NOTE — Patient Instructions (Signed)
Your physician wants you to follow-up in: 6 MONTHS WITH DR CRENSHAW You will receive a reminder letter in the mail two months in advance. If you don't receive a letter, please call our office to schedule the follow-up appointment.   Your physician recommends that you HAVE LAB WORK TODAY 

## 2014-04-28 NOTE — Assessment & Plan Note (Signed)
Patient denies dyspnea. Continue present dose of Lasix. Check potassium and renal function; check BNP. Additional Lasix daily as needed.

## 2014-04-28 NOTE — Assessment & Plan Note (Signed)
The patient has severe aortic stenosis. Given her age and overall medical condition she is not a candidate for valve replacement. She does not want to consider other procedures such as TAVR and also would most likely not be a candidate. They understand this will progress and will ultimately take her life. She is very frail. Conservative measures only. Continue Lasix at present dose. Take additional 40 mg daily PRN increased dyspnea or edema. No plans to repeat echocardiogram. Patient is a no CODE BLUE including no defibrillation, CPR or intubation. I have not added an ACE inhibitor or beta blocker previously as her blood pressure is low and she has severe aortic stenosis. Check potassium and renal function.

## 2014-04-30 ENCOUNTER — Other Ambulatory Visit: Payer: Self-pay | Admitting: *Deleted

## 2014-04-30 DIAGNOSIS — R0600 Dyspnea, unspecified: Secondary | ICD-10-CM

## 2014-04-30 MED ORDER — FUROSEMIDE 20 MG PO TABS: 40.0000 mg | ORAL_TABLET | Freq: Every day | ORAL | Status: AC

## 2014-05-07 DIAGNOSIS — I87333 Chronic venous hypertension (idiopathic) with ulcer and inflammation of bilateral lower extremity: Secondary | ICD-10-CM | POA: Diagnosis not present

## 2014-05-07 LAB — BASIC METABOLIC PANEL WITH GFR
BUN: 29 mg/dL — ABNORMAL HIGH (ref 6–23)
CHLORIDE: 103 meq/L (ref 96–112)
CO2: 33 mEq/L — ABNORMAL HIGH (ref 19–32)
CREATININE: 0.93 mg/dL (ref 0.50–1.10)
Calcium: 10.1 mg/dL (ref 8.4–10.5)
GFR, Est African American: 60 mL/min
GFR, Est Non African American: 52 mL/min — ABNORMAL LOW
GLUCOSE: 89 mg/dL (ref 70–99)
Potassium: 4.5 mEq/L (ref 3.5–5.3)
Sodium: 145 mEq/L (ref 135–145)

## 2014-05-14 ENCOUNTER — Other Ambulatory Visit: Payer: Self-pay | Admitting: Cardiology

## 2014-05-27 ENCOUNTER — Encounter: Payer: Self-pay | Admitting: Internal Medicine

## 2014-05-28 ENCOUNTER — Encounter (HOSPITAL_BASED_OUTPATIENT_CLINIC_OR_DEPARTMENT_OTHER): Payer: Medicare Other | Attending: General Surgery

## 2014-05-28 DIAGNOSIS — L97919 Non-pressure chronic ulcer of unspecified part of right lower leg with unspecified severity: Secondary | ICD-10-CM | POA: Insufficient documentation

## 2014-05-28 DIAGNOSIS — L97329 Non-pressure chronic ulcer of left ankle with unspecified severity: Secondary | ICD-10-CM | POA: Diagnosis not present

## 2014-05-28 DIAGNOSIS — I87333 Chronic venous hypertension (idiopathic) with ulcer and inflammation of bilateral lower extremity: Secondary | ICD-10-CM | POA: Insufficient documentation

## 2014-05-28 DIAGNOSIS — L97319 Non-pressure chronic ulcer of right ankle with unspecified severity: Secondary | ICD-10-CM | POA: Insufficient documentation

## 2014-06-04 ENCOUNTER — Encounter: Payer: Self-pay | Admitting: Internal Medicine

## 2014-06-04 DIAGNOSIS — R231 Pallor: Secondary | ICD-10-CM | POA: Diagnosis not present

## 2014-06-04 DIAGNOSIS — C4492 Squamous cell carcinoma of skin, unspecified: Secondary | ICD-10-CM | POA: Diagnosis not present

## 2014-06-04 MED ORDER — PREDNISONE 10 MG PO TABS: ORAL_TABLET | ORAL | Status: DC

## 2014-06-04 NOTE — Addendum Note (Signed)
Addended by: Biagio Borg on: 06/04/2014 06:42 PM   Modules accepted: Orders

## 2014-06-11 ENCOUNTER — Encounter: Payer: Self-pay | Admitting: Internal Medicine

## 2014-06-11 NOTE — Telephone Encounter (Signed)
Robin to call, and assist with making ROV for wheezing

## 2014-06-12 NOTE — Telephone Encounter (Signed)
Please help pt for ROV

## 2014-06-18 ENCOUNTER — Encounter (HOSPITAL_BASED_OUTPATIENT_CLINIC_OR_DEPARTMENT_OTHER): Payer: Medicare Other | Attending: Surgery

## 2014-06-18 ENCOUNTER — Encounter: Payer: Self-pay | Admitting: Internal Medicine

## 2014-06-18 ENCOUNTER — Ambulatory Visit (INDEPENDENT_AMBULATORY_CARE_PROVIDER_SITE_OTHER): Payer: Medicare Other | Admitting: Internal Medicine

## 2014-06-18 ENCOUNTER — Ambulatory Visit (INDEPENDENT_AMBULATORY_CARE_PROVIDER_SITE_OTHER)
Admission: RE | Admit: 2014-06-18 | Discharge: 2014-06-18 | Disposition: A | Discharge status: Home / Self Care | Payer: Medicare Other | Source: Ambulatory Visit | Attending: Internal Medicine | Admitting: Internal Medicine

## 2014-06-18 VITALS — BP 112/72 | HR 92 | Temp 98.1°F | Ht 60.0 in | Wt 96.0 lb

## 2014-06-18 DIAGNOSIS — I10 Essential (primary) hypertension: Secondary | ICD-10-CM | POA: Diagnosis not present

## 2014-06-18 DIAGNOSIS — R05 Cough: Secondary | ICD-10-CM

## 2014-06-18 DIAGNOSIS — L97329 Non-pressure chronic ulcer of left ankle with unspecified severity: Secondary | ICD-10-CM | POA: Insufficient documentation

## 2014-06-18 DIAGNOSIS — R059 Cough, unspecified: Secondary | ICD-10-CM | POA: Insufficient documentation

## 2014-06-18 DIAGNOSIS — J449 Chronic obstructive pulmonary disease, unspecified: Secondary | ICD-10-CM

## 2014-06-18 DIAGNOSIS — L97919 Non-pressure chronic ulcer of unspecified part of right lower leg with unspecified severity: Secondary | ICD-10-CM | POA: Insufficient documentation

## 2014-06-18 DIAGNOSIS — J439 Emphysema, unspecified: Secondary | ICD-10-CM | POA: Diagnosis not present

## 2014-06-18 DIAGNOSIS — I517 Cardiomegaly: Secondary | ICD-10-CM | POA: Diagnosis not present

## 2014-06-18 DIAGNOSIS — X35XXXA Volcanic eruption, initial encounter: Secondary | ICD-10-CM | POA: Diagnosis not present

## 2014-06-18 DIAGNOSIS — B369 Superficial mycosis, unspecified: Secondary | ICD-10-CM | POA: Diagnosis not present

## 2014-06-18 MED ORDER — BUDESONIDE 0.5 MG/2ML IN SUSP: 0.5000 mg | Freq: Two times a day (BID) | RESPIRATORY_TRACT | Status: AC

## 2014-06-18 NOTE — Progress Notes (Signed)
Subjective:    Patient ID: RAPHAEL ESPE, female    DOB: [birth year], 81 y.o.   MRN: [id number]  HPI  Here with daughter who has requested prednisone recently for pt wheezing, but did not seem to respond well to most recent burst.  Pt denies chest pain, increased sob or doe, orthopnea, PND, increased LE swelling, palpitations, dizziness or syncope. Has also hx of AS per last echo 2011, with reduced EF 40%, but has not had significant need for diuretic use.beyond current lasix 20 qd.  Pt denies fever, wt loss, night sweats, loss of appetite, or other constitutional symptoms, except for few lbs wt loss recently Wt Readings from Last 3 Encounters:  06/18/14 96 lb (43.545 kg)  04/28/14 95 lb (43.092 kg)  04/16/14 99 lb 4 oz (45.02 kg)  Pt denies new neurological symptoms such as new headache, or facial or extremity weakness or numbness  Has ongoing wraps changed twice per wk, sees wound clinic every 3 wks. Past Medical History  Diagnosis Date  . HYPOTHYROIDISM 10/11/2006  . VITAMIN B12 DEFICIENCY 10/11/2006  . HYPONATREMIA 07/23/2007  . ANEMIA-NOS 09/14/2007  . HYPERTENSION 10/11/2006  . AORTIC STENOSIS 02/23/2007  . LEFT BUNDLE BRANCH BLOCK 10/11/2006  . CONGESTIVE HEART FAILURE 07/23/2007  . COPD 07/23/2007  . OSTEOPOROSIS 02/23/2007  . OSTEOPENIA 10/11/2006  . SAH (subarachnoid hemorrhage) 09/29/2010  . SDH (subdural hematoma) 09/29/2010  . GERD (gastroesophageal reflux disease) 09/29/2010  . DJD (degenerative joint disease) 09/29/2010  . Frequent falls    Past Surgical History  Procedure Laterality Date  . Tonsillectomy    . Abdominal hysterectomy    . Cataract surgery    . Appendectomy      reports that she has never smoked. She does not have any smokeless tobacco history on file. She reports that she does not drink alcohol or use illicit drugs. family history includes Cancer in her other; Diabetes in her other; Thyroid disease in her maternal grandmother and mother. Allergies  Allergen  Reactions  . Cephalexin Nausea And Vomiting  . Doxycycline Hyclate Nausea And Vomiting  . Sulfamethoxazole-Trimethoprim Nausea And Vomiting    Only tablet form. IV is OK    Current Outpatient Prescriptions on File Prior to Visit  Medication Sig Dispense Refill  . acetaminophen (TYLENOL) 325 MG tablet Take 650 mg by mouth every 6 (six) hours as needed for moderate pain.    Marland Kitchen Bioflavonoid Products (ESTER C PO) Take 1,000 mg by mouth daily.     . budesonide-formoterol (SYMBICORT) 160-4.5 MCG/ACT inhaler Inhale 2 puffs into the lungs 2 (two) times daily. 1 Inhaler 12  . calcitRIOL (ROCALTROL) 0.25 MCG capsule take 1 capsule by mouth once daily 90 capsule 3  . collagenase (SANTYL) ointment Apply 1 application topically once a week.    . furosemide (LASIX) 20 MG tablet Take 2 tablets (40 mg total) by mouth daily. 120 tablet 3  . levothyroxine (SYNTHROID, LEVOTHROID) 75 MCG tablet Take 75 mcg by mouth daily before breakfast.    . Multiple Vitamin (MULITIVITAMIN WITH MINERALS) TABS Take 1 tablet by mouth daily.    Marland Kitchen omeprazole (PRILOSEC) 20 MG capsule Take 20 mg by mouth daily.    . potassium chloride SA (K-DUR,KLOR-CON) 20 MEQ tablet Take 20 mEq by mouth 2 (two) times daily.    . potassium chloride SA (K-DUR,KLOR-CON) 20 MEQ tablet take 1 tablet by mouth twice a day 60 tablet 5  . predniSONE (DELTASONE) 5 MG tablet Take 1 tablet (5 mg total)  by mouth daily with breakfast. 30 tablet 5  . PROAIR HFA 108 (90 BASE) MCG/ACT inhaler inhale 2 puffs by mouth every 6 hours if needed for wheezing 8.5 g 11  . traMADol-acetaminophen (ULTRACET) 37.5-325 MG per tablet take 1 tablet by mouth every 6 hours if needed for pain 120 tablet 1  . triamcinolone cream (KENALOG) 0.1 % Apply 1 application topically 2 (two) times daily as needed. Statis derm--wound     No current facility-administered medications on file prior to visit.     Review of Systems  Constitutional: Negative for unusual diaphoresis or other  sweats  HENT: Negative for ringing in ear Eyes: Negative for double vision or worsening visual disturbance.  Respiratory: Negative for choking and stridor.   Gastrointestinal: Negative for vomiting or other signifcant bowel change Genitourinary: Negative for hematuria or decreased urine volume.  Musculoskeletal: Negative for other MSK pain or swelling Skin: Negative for color change and worsening wound.  Neurological: Negative for tremors and numbness other than noted  Psychiatric/Behavioral: Negative for decreased concentration or agitation other than above       Objective:   Physical Exam BP 112/72 mmHg  Pulse 92  Temp(Src) 98.1 F (36.7 C) (Oral)  Ht 5' (1.524 m)  Wt 96 lb (43.545 kg)  BMI 18.75 kg/m2  SpO2 98% VS noted,  Constitutional: Pt appears frail HENT: Head: NCAT.  Right Ear: External ear normal.  Left Ear: External ear normal.  Eyes: . Pupils are equal, round, and reactive to light. Conjunctivae and EOM are normal Neck: Normal range of motion. Neck supple.  Cardiovascular: Normal rate and regular rhythm.   Pulmonary/Chest: Effort normal and breath sounds without rales or wheezing. except for exp bronchial wheeze only Abd:  Soft, NT, ND, + BS Neurological: Pt is alert. Not confused , motor grossly intact Skin: Skin is warm. No rash Psychiatric: Pt behavior is normal. No agitation.     Assessment & Plan:

## 2014-06-18 NOTE — Assessment & Plan Note (Addendum)
Overall stable, no evidence for worsening wheezing or bronchospasm, daughter reassured, pt with difficult time using symbicort, ok for add steroid nebs per [facility],  to f/u any worsening symptoms or concerns

## 2014-06-18 NOTE — Telephone Encounter (Signed)
Left message on mychart  Robin to inform daughter by phone  Tammy to add pt to schedule

## 2014-06-18 NOTE — Assessment & Plan Note (Signed)
For cxr to help with assess for volume increase, but exam without significant volume overload, cont same tx for now,  to f/u any worsening symptoms or concerns

## 2014-06-18 NOTE — Progress Notes (Signed)
Pre visit review using our clinic review tool, if applicable. No additional management support is needed unless otherwise documented below in the visit note. 

## 2014-06-18 NOTE — Patient Instructions (Signed)
Please continue all other medications as before, and refills have been done if requested.  Please have the pharmacy call with any other refills you may need.  Please continue your efforts at being more active, low cholesterol diet, and weight control.  You are otherwise up to date with prevention measures today.  Please keep your appointments with your specialists as you may have planned  Please go to the XRAY Department in the Basement (go straight as you get off the elevator) for the x-ray testing  You will be contacted by phone if any changes need to be made immediately.  Otherwise, you will receive a letter about your results with an explanation, but please check with MyChart first.  You will be contacted regarding the referral for: [facility] for home steroid nebuilzed medication

## 2014-06-18 NOTE — Assessment & Plan Note (Signed)
stable overall by history and exam, recent data reviewed with pt, and pt to continue medical treatment as before,  to f/u any worsening symptoms or concerns BP Readings from Last 3 Encounters:  06/18/14 112/72  04/28/14 94/52  04/16/14 110/72

## 2014-06-19 ENCOUNTER — Encounter (HOSPITAL_COMMUNITY): Payer: Self-pay | Admitting: Emergency Medicine

## 2014-06-19 ENCOUNTER — Emergency Department (HOSPITAL_COMMUNITY): Payer: Medicare Other

## 2014-06-19 ENCOUNTER — Emergency Department (HOSPITAL_COMMUNITY)
Admission: EM | Admit: 2014-06-19 | Discharge: 2014-06-19 | Disposition: A | Discharge status: Home / Self Care | Payer: Medicare Other | Attending: Emergency Medicine | Admitting: Emergency Medicine

## 2014-06-19 DIAGNOSIS — S0003XA Contusion of scalp, initial encounter: Secondary | ICD-10-CM | POA: Diagnosis not present

## 2014-06-19 DIAGNOSIS — W07XXXA Fall from chair, initial encounter: Secondary | ICD-10-CM | POA: Diagnosis not present

## 2014-06-19 DIAGNOSIS — E039 Hypothyroidism, unspecified: Secondary | ICD-10-CM | POA: Diagnosis not present

## 2014-06-19 DIAGNOSIS — I10 Essential (primary) hypertension: Secondary | ICD-10-CM | POA: Insufficient documentation

## 2014-06-19 DIAGNOSIS — Y9389 Activity, other specified: Secondary | ICD-10-CM | POA: Diagnosis not present

## 2014-06-19 DIAGNOSIS — W19XXXA Unspecified fall, initial encounter: Secondary | ICD-10-CM

## 2014-06-19 DIAGNOSIS — Y998 Other external cause status: Secondary | ICD-10-CM | POA: Diagnosis not present

## 2014-06-19 DIAGNOSIS — Z9181 History of falling: Secondary | ICD-10-CM | POA: Insufficient documentation

## 2014-06-19 DIAGNOSIS — S0093XA Contusion of unspecified part of head, initial encounter: Secondary | ICD-10-CM | POA: Diagnosis not present

## 2014-06-19 DIAGNOSIS — T148 Other injury of unspecified body region: Secondary | ICD-10-CM | POA: Diagnosis not present

## 2014-06-19 DIAGNOSIS — I509 Heart failure, unspecified: Secondary | ICD-10-CM | POA: Diagnosis not present

## 2014-06-19 DIAGNOSIS — J449 Chronic obstructive pulmonary disease, unspecified: Secondary | ICD-10-CM | POA: Insufficient documentation

## 2014-06-19 DIAGNOSIS — Z7951 Long term (current) use of inhaled steroids: Secondary | ICD-10-CM | POA: Diagnosis not present

## 2014-06-19 DIAGNOSIS — K219 Gastro-esophageal reflux disease without esophagitis: Secondary | ICD-10-CM | POA: Insufficient documentation

## 2014-06-19 DIAGNOSIS — Z862 Personal history of diseases of the blood and blood-forming organs and certain disorders involving the immune mechanism: Secondary | ICD-10-CM | POA: Diagnosis not present

## 2014-06-19 DIAGNOSIS — Z7952 Long term (current) use of systemic steroids: Secondary | ICD-10-CM | POA: Diagnosis not present

## 2014-06-19 DIAGNOSIS — S0101XA Laceration without foreign body of scalp, initial encounter: Secondary | ICD-10-CM | POA: Diagnosis not present

## 2014-06-19 DIAGNOSIS — Y92009 Unspecified place in unspecified non-institutional (private) residence as the place of occurrence of the external cause: Secondary | ICD-10-CM | POA: Diagnosis not present

## 2014-06-19 DIAGNOSIS — Z79899 Other long term (current) drug therapy: Secondary | ICD-10-CM | POA: Insufficient documentation

## 2014-06-19 DIAGNOSIS — S0990XA Unspecified injury of head, initial encounter: Secondary | ICD-10-CM | POA: Diagnosis present

## 2014-06-19 DIAGNOSIS — Z8739 Personal history of other diseases of the musculoskeletal system and connective tissue: Secondary | ICD-10-CM | POA: Insufficient documentation

## 2014-06-19 LAB — I-STAT CHEM 8, ED
BUN: 38 mg/dL — ABNORMAL HIGH (ref 6–23)
CALCIUM ION: 1.22 mmol/L (ref 1.13–1.30)
CHLORIDE: 100 mmol/L (ref 96–112)
Creatinine, Ser: 1.1 mg/dL (ref 0.50–1.10)
Glucose, Bld: 100 mg/dL — ABNORMAL HIGH (ref 70–99)
HCT: 37 % (ref 36.0–46.0)
Hemoglobin: 12.6 g/dL (ref 12.0–15.0)
POTASSIUM: 3.9 mmol/L (ref 3.5–5.1)
Sodium: 139 mmol/L (ref 135–145)
TCO2: 26 mmol/L (ref 0–100)

## 2014-06-19 LAB — I-STAT TROPONIN, ED: Troponin i, poc: 0.06 ng/mL (ref 0.00–0.08)

## 2014-06-19 NOTE — H&P (Signed)
NAME:  Theresa Hill, Theresa Hill                 ACCOUNT NO.:  [id number]  MEDICAL RECORD NO.:  [id number]  LOCATION:  FOOT                         FACILITY:  [facility]  PHYSICIAN:  Christin Fudge, MD       DATE OF BIRTH:  August 16, 1915  DATE OF ADMISSION:  06/18/2014 DATE OF DISCHARGE:                             HISTORY & PHYSICAL   This pleasant 81 year old patient comes along with her daughter and has been coming to the [facility] for a long while.  Her chief complaints are chronic lower limb ulcerations near both ankles and these have been there for several years.  Subjectively, the patient and her daughter said that there is some redness of the legs which has increased recently.  The wounds are getting smaller, and there is no evidence of foul odor.  Objectively, the patient is awake and alert, laying comfortably in bed.  Vital signs are stable and she is afebrile. Examination of the patient's ulcer showed that she has bilateral venous ulcers and on the left lateral ankle the size is 1.7 x 3.0 x 0.2 cm. These were addressed.  On the right medial lower extremity, the size of the ulcer is 3.5 x 3.1 x 0.2 and again this is a cluster.  On the right lateral ankle, it is 2.2 x 1.0 x 0.4 cm.  The entire lower leg has a fungal infection with redness and a typical area of infection seen in the area below the knee.  Other than that, the legs do not have any inflammation, or there is no evidence of foul odor or pus.  ASSESSMENT AND PLAN:  The assessment is that of, 1. Bilateral chronic venous ulcers both lower extremities near the     ankle. 2. Fungal infection, bilateral lower extremities.  Planof care:  I have recommended that they continue with the silver alginate dressing locally which is being done twice a week with a light wrap.  I have recommended that prior to applying the wrap I have recommended Lotrisone 1% cream to be applied to the rash as often as possible before the dressings.  Light  compression can be carried on. The patient will be seen back in about 3 weeks.  All questions have been answered.          ______________________________ Christin Fudge, MD     EB/MEDQ  D:  06/18/2014  T:  06/19/2014  Job:  [id number]

## 2014-06-19 NOTE — ED Notes (Signed)
Patient returned back from xray and placed back on monitor.

## 2014-06-19 NOTE — ED Notes (Signed)
Patient returned from Radiology. 

## 2014-06-19 NOTE — ED Notes (Signed)
From home; at kitchen table with family, fell out of chair and incurred large hematoma to left temple. Family reported no LOC before, during or after the fall from the chair. Family reported at baseline mental status. Patient shakes head no to pain.

## 2014-06-19 NOTE — Discharge Instructions (Signed)
The CT imaging of her head did not show any bleeding inside the head or fracture of the skull. Her lab work was normal. You can ice the area, and give tylenol for any pain.   Fall Prevention and Home Safety Falls cause injuries and can affect all age groups. It is possible to use preventive measures to significantly decrease the likelihood of falls. There are many simple measures which can make your home safer and prevent falls. OUTDOORS  Repair cracks and edges of walkways and driveways.  Remove high doorway thresholds.  Trim shrubbery on the main path into your home.  Have good outside lighting.  Clear walkways of tools, rocks, debris, and clutter.  Check that handrails are not broken and are securely fastened. Both sides of steps should have handrails.  Have leaves, snow, and ice cleared regularly.  Use sand or salt on walkways during winter months.  In the garage, clean up grease or oil spills. BATHROOM  Install night lights.  Install grab bars by the toilet and in the tub and shower.  Use non-skid mats or decals in the tub or shower.  Place a plastic non-slip stool in the shower to sit on, if needed.  Keep floors dry and clean up all water on the floor immediately.  Remove soap buildup in the tub or shower on a regular basis.  Secure bath mats with non-slip, double-sided rug tape.  Remove throw rugs and tripping hazards from the floors. BEDROOMS  Install night lights.  Make sure a bedside light is easy to reach.  Do not use oversized bedding.  Keep a telephone by your bedside.  Have a firm chair with side arms to use for getting dressed.  Remove throw rugs and tripping hazards from the floor. KITCHEN  Keep handles on pots and pans turned toward the center of the stove. Use back burners when possible.  Clean up spills quickly and allow time for drying.  Avoid walking on wet floors.  Avoid hot utensils and knives.  Position shelves so they are not too  high or low.  Place commonly used objects within easy reach.  If necessary, use a sturdy step stool with a grab bar when reaching.  Keep electrical cables out of the way.  Do not use floor polish or wax that makes floors slippery. If you must use wax, use non-skid floor wax.  Remove throw rugs and tripping hazards from the floor. STAIRWAYS  Never leave objects on stairs.  Place handrails on both sides of stairways and use them. Fix any loose handrails. Make sure handrails on both sides of the stairways are as long as the stairs.  Check carpeting to make sure it is firmly attached along stairs. Make repairs to worn or loose carpet promptly.  Avoid placing throw rugs at the top or bottom of stairways, or properly secure the rug with carpet tape to prevent slippage. Get rid of throw rugs, if possible.  Have an electrician put in a light switch at the top and bottom of the stairs. OTHER FALL PREVENTION TIPS  Wear low-heel or rubber-soled shoes that are supportive and fit well. Wear closed toe shoes.  When using a stepladder, make sure it is fully opened and both spreaders are firmly locked. Do not climb a closed stepladder.  Add color or contrast paint or tape to grab bars and handrails in your home. Place contrasting color strips on first and last steps.  Learn and use mobility aids as needed. Install an  electrical emergency response system.  Turn on lights to avoid dark areas. Replace light bulbs that burn out immediately. Get light switches that glow.  Arrange furniture to create clear pathways. Keep furniture in the same place.  Firmly attach carpet with non-skid or double-sided tape.  Eliminate uneven floor surfaces.  Select a carpet pattern that does not visually hide the edge of steps.  Be aware of all pets. OTHER HOME SAFETY TIPS  Set the water temperature for 120 F (48.8 C).  Keep emergency numbers on or near the telephone.  Keep smoke detectors on every level  of the home and near sleeping areas. Document Released: 04/22/2002 Document Revised: 11/01/2011 Document Reviewed: 07/22/2011 [facility] Patient Information 2015 [place], [place]. This information is not intended to replace advice given to you by your health care provider. Make sure you discuss any questions you have with your health care provider.

## 2014-06-19 NOTE — ED Provider Notes (Signed)
CSN: [id number]     Arrival date & time 06/19/14  1105 History   First MD Initiated Contact with Patient 06/19/14 1107     Chief Complaint  Patient presents with  . Fall     (Consider location/radiation/quality/duration/timing/severity/associated sxs/prior Treatment) HPI Comments: History limited due to pt baseline mental state, will open eyes and answer some questions but not all; no family at bedside. PMH significant for CHF, CAD, AS, COPD, frequent falls. Per EMS, pt was sitting at table when she fell and hit the left side of her head. There was no reported loss of consciousness before or after the fall. She does not complain of any pain when asked several times.   Patient is a 81 y.o. female presenting with fall. The history is provided by the EMS personnel. The history is limited by the condition of the patient.  Fall This is a new problem. The current episode started today. The problem occurs intermittently. The problem has been unchanged. Pertinent negatives include no chest pain.    Past Medical History  Diagnosis Date  . HYPOTHYROIDISM 10/11/2006  . VITAMIN B12 DEFICIENCY 10/11/2006  . HYPONATREMIA 07/23/2007  . ANEMIA-NOS 09/14/2007  . HYPERTENSION 10/11/2006  . AORTIC STENOSIS 02/23/2007  . LEFT BUNDLE BRANCH BLOCK 10/11/2006  . CONGESTIVE HEART FAILURE 07/23/2007  . COPD 07/23/2007  . OSTEOPOROSIS 02/23/2007  . OSTEOPENIA 10/11/2006  . SAH (subarachnoid hemorrhage) 09/29/2010  . SDH (subdural hematoma) 09/29/2010  . GERD (gastroesophageal reflux disease) 09/29/2010  . DJD (degenerative joint disease) 09/29/2010  . Frequent falls    Past Surgical History  Procedure Laterality Date  . Tonsillectomy    . Abdominal hysterectomy    . Cataract surgery    . Appendectomy     Family History  Problem Relation Age of Onset  . Thyroid disease Mother   . Thyroid disease Maternal Grandmother   . Cancer Other     breast  . Diabetes Other    History  Substance Use Topics  . Smoking  status: Never Smoker   . Smokeless tobacco: Not on file  . Alcohol Use: No   OB History    No data available     Review of Systems  Unable to perform ROS Eyes: Negative for visual disturbance.  Respiratory: Negative for shortness of breath.   Cardiovascular: Negative for chest pain.      Allergies  Cephalexin; Doxycycline hyclate; and Sulfamethoxazole-trimethoprim  Home Medications   Prior to Admission medications   Medication Sig Start Date End Date Taking? Authorizing Provider  acetaminophen (TYLENOL) 325 MG tablet Take 650 mg by mouth every 6 (six) hours as needed for moderate pain.    Historical Provider, MD  Bioflavonoid Products (ESTER C PO) Take 1,000 mg by mouth daily.     Historical Provider, MD  budesonide (PULMICORT) 0.5 MG/2ML nebulizer solution Take 2 mLs (0.5 mg total) by nebulization 2 (two) times daily. 06/18/14   Biagio Borg, MD  budesonide-formoterol [facility]) 160-4.5 MCG/ACT inhaler Inhale 2 puffs into the lungs 2 (two) times daily. 11/22/13   Biagio Borg, MD  calcitRIOL (ROCALTROL) 0.25 MCG capsule take 1 capsule by mouth once daily 03/04/14   Biagio Borg, MD  collagenase (SANTYL) ointment Apply 1 application topically once a week.    Historical Provider, MD  furosemide (LASIX) 20 MG tablet Take 2 tablets (40 mg total) by mouth daily. 04/30/14   Lelon Perla, MD  levothyroxine (SYNTHROID, LEVOTHROID) 75 MCG tablet Take 75 mcg by mouth  daily before breakfast.    Historical Provider, MD  Multiple Vitamin (MULITIVITAMIN WITH MINERALS) TABS Take 1 tablet by mouth daily.    Historical Provider, MD  omeprazole (PRILOSEC) 20 MG capsule Take 20 mg by mouth daily.    Historical Provider, MD  potassium chloride SA (K-DUR,KLOR-CON) 20 MEQ tablet Take 20 mEq by mouth 2 (two) times daily.    Historical Provider, MD  potassium chloride SA (K-DUR,KLOR-CON) 20 MEQ tablet take 1 tablet by mouth twice a day 05/14/14   Lelon Perla, MD  predniSONE (DELTASONE) 5 MG  tablet Take 1 tablet (5 mg total) by mouth daily with breakfast. 02/06/14   Biagio Borg, MD  PROAIR HFA 108 (90 BASE) MCG/ACT inhaler inhale 2 puffs by mouth every 6 hours if needed for wheezing 09/12/13   Biagio Borg, MD  traMADol-acetaminophen [facility]) 37.5-325 MG per tablet take 1 tablet by mouth every 6 hours if needed for pain 02/20/14   Biagio Borg, MD  triamcinolone cream (KENALOG) 0.1 % Apply 1 application topically 2 (two) times daily as needed. Statis derm--wound       BP 104/61 mmHg  Pulse 90  Temp(Src) 98.2 F (36.8 C) (Oral)  Resp 28  SpO2 95% Physical Exam  Constitutional: No distress.  HENT:  Head: Head is with contusion and with laceration.    Eyes:    Right pupil reactive. Left eye cataract. EOM intact.   Neck: Normal range of motion. Neck supple.  No c-spine tenderness  Cardiovascular: Normal rate and intact distal pulses.   Pulmonary/Chest: She has no wheezes. She has no rales.  Increased effort, tachycardia. Barrel chested  Abdominal: Soft. Bowel sounds are normal. She exhibits no distension. There is no tenderness.  Musculoskeletal: She exhibits tenderness (LE, foot/ankle with compression wrapping).  Neurological: She is alert. She has normal strength. She is not disoriented. No cranial nerve deficit (CN grossly normal). GCS eye subscore is 4. GCS verbal subscore is 5. GCS motor subscore is 6.  Oriented to person/city/age  Skin: Skin is warm and dry. Ecchymosis and laceration noted.  Nursing note and vitals reviewed.   ED Course  Procedures (including critical care time) Labs Review Labs Reviewed  I-STAT CHEM 8, ED - Abnormal; Notable for the following:    BUN 38 (*)    Glucose, Bld 100 (*)    All other components within normal limits  I-STAT TROPOININ, ED    Imaging Review Dg Chest 1 View  06/19/2014   CLINICAL DATA:  Status post fall laceration of the face and head history of COPD and CHF  EXAM: CHEST  1 VIEW  COMPARISON:  PA and lateral chest of  June 18, 2014  FINDINGS: There remains bilateral hyperinflation despite the patient's severe kyphosis. The interstitial markings remain increased bilaterally. The cardiac silhouette is enlarged though stable. The pulmonary vascularity is not clearly engorged. The visualized bony structures of the thorax exhibit no acute abnormality. There is known severe kyphosis.  IMPRESSION: COPD. There is no definite evidence of CHF, pneumonia, nor other acute cardiopulmonary abnormality.   Electronically Signed   By: David  Martinique   On: 06/19/2014 13:07   Dg Chest 2 View  06/18/2014   CLINICAL DATA:  Cough, history of COPD  EXAM: CHEST  2 VIEW  COMPARISON:  Chest x-ray of 11/22/2013  FINDINGS: The lungs remain very hyperaerated with wide AP diameter consistent with severe emphysema. No active infiltrate or effusion is seen with minimal linear atelectasis noted at the  right lung base. The patient is somewhat rotated making assessment of the hilar structures difficult. Cardiomegaly is stable. The bones are osteopenic and there is a severe thoracic kyphosis present. A partially compressed mid lower thoracic vertebral body again is noted.  IMPRESSION: 1. Severe emphysema. No definite active process. Assessment of the hila is limited due to the patient's positioning. 2. Stable cardiomegaly. 3. Diffuse osteopenia with severe kyphosis.   Electronically Signed   By: Ivar Drape M.D.   On: 06/18/2014 17:20   Ct Head Wo Contrast  06/19/2014   CLINICAL DATA:  Supraorbital swelling and pain ; headache post trauma/ fall  EXAM: CT HEAD WITHOUT CONTRAST  TECHNIQUE: Contiguous axial images were obtained from the base of the skull through the vertex without intravenous contrast.  COMPARISON:  February 03, 2014  FINDINGS: Moderate diffuse atrophy is stable. There is no intracranial mass, hemorrhage, extra-axial fluid collection, or midline shift. Small vessel disease in the centra semiovale bilaterally appear stable. There is no new  gray-white compartment lesion. No acute infarct apparent.  There is a left frontal scalp hematoma. Bony calvarium appears intact. The mastoid air cells are clear. There is a small air-fluid level in the right maxillary antrum. There is mild mucosal thickening in several ethmoid air cells on the right. There is evidence on the left of previous retinal detachment with calcification. There is calcification in the region of the left lens as well.  IMPRESSION: Atrophy with small vessel disease, stable. Left frontal scalp hematoma. No fracture. No intracranial mass, hemorrhage, or extra-axial fluid. No acute appearing infarct. Changes in the left globe are stable compared to prior study.   Electronically Signed   By: Lowella Grip M.D.   On: 06/19/2014 13:01     EKG Interpretation   Date/Time:  Thursday June 19 2014 11:22:17 EST Ventricular Rate:  82 PR Interval:  232 QRS Duration: 167 QT Interval:  437 QTC Calculation: 510 R Axis:   -50 Text Interpretation:  Sinus rhythm Prolonged PR interval Left bundle  branch block Inferior infarct, acute (RCA) Probable RV involvement,  suggest recording right precordial leads No significant change since last  tracing Confirmed by BEATON  MD, ROBERT ([id number]) on 06/19/2014 11:26:04 AM      MDM   Final diagnoses:  Fall, initial encounter  Scalp hematoma, initial encounter   CT head negative for fracture or bleed. Laceration on scalp small, no need for suture repair, it was hemostatic on admission. i-stat chem largely unremarkable. Per family she is at baseline currently, not complaining of any pain. Stable for discharge.    Leone Brand, MD 06/19/14 [place], MD 06/20/14 [phone]

## 2014-06-19 NOTE — ED Notes (Signed)
Patient transported to CT 

## 2014-06-20 ENCOUNTER — Ambulatory Visit: Payer: Self-pay | Admitting: Internal Medicine

## 2014-06-20 DIAGNOSIS — J449 Chronic obstructive pulmonary disease, unspecified: Secondary | ICD-10-CM | POA: Diagnosis not present

## 2014-07-01 ENCOUNTER — Other Ambulatory Visit: Payer: Self-pay | Admitting: Internal Medicine

## 2014-07-01 DIAGNOSIS — G4734 Idiopathic sleep related nonobstructive alveolar hypoventilation: Secondary | ICD-10-CM

## 2014-07-07 ENCOUNTER — Encounter: Payer: Self-pay | Admitting: Internal Medicine

## 2014-07-07 DIAGNOSIS — L97309 Non-pressure chronic ulcer of unspecified ankle with unspecified severity: Secondary | ICD-10-CM | POA: Diagnosis not present

## 2014-07-07 DIAGNOSIS — J449 Chronic obstructive pulmonary disease, unspecified: Secondary | ICD-10-CM

## 2014-07-07 DIAGNOSIS — I509 Heart failure, unspecified: Secondary | ICD-10-CM

## 2014-07-10 ENCOUNTER — Ambulatory Visit (INDEPENDENT_AMBULATORY_CARE_PROVIDER_SITE_OTHER): Payer: Medicare Other | Admitting: Internal Medicine

## 2014-07-10 ENCOUNTER — Encounter: Payer: Self-pay | Admitting: Internal Medicine

## 2014-07-10 VITALS — BP 110/70 | HR 82 | Temp 98.5°F | Resp 20

## 2014-07-10 DIAGNOSIS — Z515 Encounter for palliative care: Secondary | ICD-10-CM

## 2014-07-10 DIAGNOSIS — I10 Essential (primary) hypertension: Secondary | ICD-10-CM

## 2014-07-10 DIAGNOSIS — J449 Chronic obstructive pulmonary disease, unspecified: Secondary | ICD-10-CM | POA: Diagnosis not present

## 2014-07-10 MED ORDER — PREDNISONE 5 MG PO TABS: ORAL_TABLET | ORAL | Status: DC

## 2014-07-10 NOTE — Progress Notes (Signed)
Subjective:    Patient ID: Theresa Hill, female    DOB: 1916/03/07, 81 y.o.   MRN: [id number]  HPI  Here to f/u with daughter, pt sleeping quite a bit during the day for unclear reasons, but Pt denies chest pain, increased sob or doe, wheezing, orthopnea, PND, increased LE swelling, palpitations, dizziness or syncope.  Has good days and bad days with fatigue.   Pt denies fever, wt loss, night sweats, loss of appetite, or other constitutional symptoms.   Wt Readings from Last 3 Encounters:  06/18/14 96 lb (43.545 kg)  04/28/14 95 lb (43.092 kg)  04/16/14 99 lb 4 oz (45.02 kg)  Has ongoing chornic hoarseness which seemed to start with recent worsening copd issues, new nebs helping quite a bit, no longer needing the resuce inhaler in the past month.  Denies worsening reflux, abd pain, dysphagia, n/v, bowel change or blood. Has been reading, and spontenously wished her daughter happy bday yeterday )(whcih was true).  For ONO to be done tonight. Denies urinary symptoms such as dysuria, frequency, urgency, flank pain, hematuria or n/v, fever, chills. Past Medical History  Diagnosis Date  . HYPOTHYROIDISM 10/11/2006  . VITAMIN B12 DEFICIENCY 10/11/2006  . HYPONATREMIA 07/23/2007  . ANEMIA-NOS 09/14/2007  . HYPERTENSION 10/11/2006  . AORTIC STENOSIS 02/23/2007  . LEFT BUNDLE BRANCH BLOCK 10/11/2006  . CONGESTIVE HEART FAILURE 07/23/2007  . COPD 07/23/2007  . OSTEOPOROSIS 02/23/2007  . OSTEOPENIA 10/11/2006  . SAH (subarachnoid hemorrhage) 09/29/2010  . SDH (subdural hematoma) 09/29/2010  . GERD (gastroesophageal reflux disease) 09/29/2010  . DJD (degenerative joint disease) 09/29/2010  . Frequent falls    Past Surgical History  Procedure Laterality Date  . Tonsillectomy    . Abdominal hysterectomy    . Cataract surgery    . Appendectomy      reports that she has never smoked. She does not have any smokeless tobacco history on file. She reports that she does not drink alcohol or use illicit  drugs. family history includes Cancer in her other; Diabetes in her other; Thyroid disease in her maternal grandmother and mother. No Known Allergies Current Outpatient Prescriptions on File Prior to Visit  Medication Sig Dispense Refill  . acetaminophen (TYLENOL) 325 MG tablet Take 650 mg by mouth every 6 (six) hours as needed for moderate pain.    Marland Kitchen Bioflavonoid Products (ESTER C PO) Take 1,000 mg by mouth daily.     . budesonide (PULMICORT) 0.5 MG/2ML nebulizer solution Take 2 mLs (0.5 mg total) by nebulization 2 (two) times daily. 20 mL 12  . budesonide-formoterol (SYMBICORT) 160-4.5 MCG/ACT inhaler Inhale 2 puffs into the lungs 2 (two) times daily. 1 Inhaler 12  . calcitRIOL (ROCALTROL) 0.25 MCG capsule take 1 capsule by mouth once daily 90 capsule 3  . collagenase (SANTYL) ointment Apply 1 application topically once a week.    . furosemide (LASIX) 20 MG tablet Take 2 tablets (40 mg total) by mouth daily. 120 tablet 3  . levothyroxine (SYNTHROID, LEVOTHROID) 75 MCG tablet Take 75 mcg by mouth daily before breakfast.    . Multiple Vitamin (MULITIVITAMIN WITH MINERALS) TABS Take 1 tablet by mouth daily.    Marland Kitchen omeprazole (PRILOSEC) 20 MG capsule Take 20 mg by mouth daily.    . potassium chloride SA (K-DUR,KLOR-CON) 20 MEQ tablet Take 20 mEq by mouth 2 (two) times daily.    . potassium chloride SA (K-DUR,KLOR-CON) 20 MEQ tablet take 1 tablet by mouth twice a day 60 tablet 5  .  predniSONE (DELTASONE) 5 MG tablet Take 1 tablet (5 mg total) by mouth daily with breakfast. 30 tablet 5  . PROAIR HFA 108 (90 BASE) MCG/ACT inhaler inhale 2 puffs by mouth every 6 hours if needed for wheezing 8.5 g 11  . traMADol-acetaminophen (ULTRACET) 37.5-325 MG per tablet take 1 tablet by mouth every 6 hours if needed for pain 120 tablet 1  . triamcinolone cream (KENALOG) 0.1 % Apply 1 application topically 2 (two) times daily as needed. Statis derm--wound     No current facility-administered medications on file  prior to visit.    Review of Systems  Constitutional: Negative for unusual diaphoresis or other sweats  HENT: Negative for ringing in ear Eyes: Negative for double vision or worsening visual disturbance.  Respiratory: Negative for choking and stridor.   Gastrointestinal: Negative for vomiting or other signifcant bowel change Genitourinary: Negative for hematuria or decreased urine volume.  Musculoskeletal: Negative for other MSK pain or swelling Skin: Negative for color change and worsening wound.  Neurological: Negative for tremors and numbness other than noted  Psychiatric/Behavioral: Negative for decreased concentration or agitation other than above       Objective:   Physical Exam BP 110/70 mmHg  Pulse 82  Temp(Src) 98.5 F (36.9 C) (Oral)  Resp 20  SpO2 95% VS noted, nontoxic, sleeping intermittently during exam, leaning somewhat forward Constitutional: Pt appears well-developed, well-nourished.  HENT: Head: NCAT.  Right Ear: External ear normal.  Left Ear: External ear normal.  Eyes: . Pupils are equal, round, and reactive to light. Conjunctivae and EOM are normal Neck: Normal range of motion. Neck supple.  Cardiovascular: Normal rate and regular rhythm.   Pulmonary/Chest: Effort normal and breath sounds without rales or wheezing., somewhat decreased  Abd:  Soft, NT, ND, + BS Neurological: Pt is alert. Not confused , motor grossly intact Skin: Skin is warm. No rash Psychiatric: Pt behavior is normal. No agitation.         Assessment & Plan:

## 2014-07-10 NOTE — Patient Instructions (Addendum)
OK to take HALF of the Prednisone 5 mg (for 2.5 mg per day only)  Please continue all other medications as before, and refills have been done if requested.  Please have the pharmacy call with any other refills you may need.  Please keep your appointments with your specialists as you may have planned

## 2014-07-10 NOTE — Assessment & Plan Note (Signed)
Improved control, cont inhalers and pulmonicort neb, ok to decrese the oral prednisone to 2.5 qd,  to f/u any worsening symptoms or concerns

## 2014-07-10 NOTE — Progress Notes (Signed)
Pre visit review using our clinic review tool, if applicable. No additional management support is needed unless otherwise documented below in the visit note. 

## 2014-07-10 NOTE — Assessment & Plan Note (Signed)
With increased daytime fatigue, for ONO tonight to r/o nocturnal hypoxia/osa, but also ok for Hosipice referral

## 2014-07-10 NOTE — Assessment & Plan Note (Signed)
stable overall by history and exam, recent data reviewed with pt, and pt to continue medical treatment as before,  to f/u any worsening symptoms or concerns BP Readings from Last 3 Encounters:  07/10/14 110/70  06/19/14 104/61  06/18/14 112/72

## 2014-07-11 ENCOUNTER — Telehealth: Payer: Self-pay | Admitting: Internal Medicine

## 2014-07-11 DIAGNOSIS — G4734 Idiopathic sleep related nonobstructive alveolar hypoventilation: Secondary | ICD-10-CM | POA: Diagnosis not present

## 2014-07-11 NOTE — Telephone Encounter (Signed)
emmi emailed °

## 2014-07-13 ENCOUNTER — Telehealth: Payer: Self-pay | Admitting: Internal Medicine

## 2014-07-13 DIAGNOSIS — G4734 Idiopathic sleep related nonobstructive alveolar hypoventilation: Secondary | ICD-10-CM

## 2014-07-13 NOTE — Telephone Encounter (Signed)
-----   Message from Berton Mount sent at 07/12/2014  4:55 PM EST ----- Dr Jenny Reichmann,  This is Leafy Ro from [place]. We did overnight oximetry on Ms Mehler. Her lowest sp02 was 70%, with 191 desaturations, and time spent below 88% was 44 minutes.  Can you put in order for nocturnal 02 at 2lpm?  Thank you so much!  Richardine Service Sales Rep Ace Gins  [phone]  IT is working on my staff messages, so you cannot staff message me back, but I will look into epic tomorrow for order and we will get Ms Tomaso setup on home 02.  Thanks so much!

## 2014-07-13 NOTE — Telephone Encounter (Signed)
Monette for home o2 - 2L  - will order  Staff to update [facility]

## 2014-07-14 DIAGNOSIS — J449 Chronic obstructive pulmonary disease, unspecified: Secondary | ICD-10-CM | POA: Diagnosis not present

## 2014-07-16 ENCOUNTER — Encounter (HOSPITAL_BASED_OUTPATIENT_CLINIC_OR_DEPARTMENT_OTHER): Payer: Medicare Other | Attending: Surgery

## 2014-07-16 DIAGNOSIS — L97811 Non-pressure chronic ulcer of other part of right lower leg limited to breakdown of skin: Secondary | ICD-10-CM | POA: Insufficient documentation

## 2014-07-16 DIAGNOSIS — L97321 Non-pressure chronic ulcer of left ankle limited to breakdown of skin: Secondary | ICD-10-CM | POA: Insufficient documentation

## 2014-07-16 DIAGNOSIS — I872 Venous insufficiency (chronic) (peripheral): Secondary | ICD-10-CM | POA: Insufficient documentation

## 2014-07-16 DIAGNOSIS — L97311 Non-pressure chronic ulcer of right ankle limited to breakdown of skin: Secondary | ICD-10-CM | POA: Insufficient documentation

## 2014-07-16 DIAGNOSIS — L97821 Non-pressure chronic ulcer of other part of left lower leg limited to breakdown of skin: Secondary | ICD-10-CM | POA: Insufficient documentation

## 2014-07-23 DIAGNOSIS — S80811A Abrasion, right lower leg, initial encounter: Secondary | ICD-10-CM | POA: Diagnosis not present

## 2014-07-23 DIAGNOSIS — I83212 Varicose veins of right lower extremity with both ulcer of calf and inflammation: Secondary | ICD-10-CM | POA: Diagnosis not present

## 2014-07-23 DIAGNOSIS — I872 Venous insufficiency (chronic) (peripheral): Secondary | ICD-10-CM | POA: Diagnosis not present

## 2014-07-23 DIAGNOSIS — L97311 Non-pressure chronic ulcer of right ankle limited to breakdown of skin: Secondary | ICD-10-CM | POA: Diagnosis not present

## 2014-07-23 DIAGNOSIS — L97811 Non-pressure chronic ulcer of other part of right lower leg limited to breakdown of skin: Secondary | ICD-10-CM | POA: Diagnosis not present

## 2014-07-23 DIAGNOSIS — I83223 Varicose veins of left lower extremity with both ulcer of ankle and inflammation: Secondary | ICD-10-CM | POA: Diagnosis not present

## 2014-07-23 DIAGNOSIS — L97821 Non-pressure chronic ulcer of other part of left lower leg limited to breakdown of skin: Secondary | ICD-10-CM | POA: Diagnosis not present

## 2014-07-23 DIAGNOSIS — L97321 Non-pressure chronic ulcer of left ankle limited to breakdown of skin: Secondary | ICD-10-CM | POA: Diagnosis not present

## 2014-07-24 ENCOUNTER — Encounter: Payer: Self-pay | Admitting: Internal Medicine

## 2014-07-24 DIAGNOSIS — L97309 Non-pressure chronic ulcer of unspecified ankle with unspecified severity: Secondary | ICD-10-CM | POA: Diagnosis not present

## 2014-07-24 DIAGNOSIS — J449 Chronic obstructive pulmonary disease, unspecified: Secondary | ICD-10-CM

## 2014-07-24 DIAGNOSIS — R269 Unspecified abnormalities of gait and mobility: Secondary | ICD-10-CM

## 2014-07-24 NOTE — Telephone Encounter (Signed)
Staff to see mychart note, thanks

## 2014-08-11 ENCOUNTER — Telehealth: Payer: Self-pay | Admitting: Internal Medicine

## 2014-08-11 NOTE — Telephone Encounter (Signed)
She is calling on the status of hospice referral. I spoke to [facility] who states that she needs to check on this.

## 2014-08-12 DIAGNOSIS — J449 Chronic obstructive pulmonary disease, unspecified: Secondary | ICD-10-CM | POA: Diagnosis not present

## 2014-08-13 DIAGNOSIS — L97821 Non-pressure chronic ulcer of other part of left lower leg limited to breakdown of skin: Secondary | ICD-10-CM | POA: Diagnosis not present

## 2014-08-13 DIAGNOSIS — L97811 Non-pressure chronic ulcer of other part of right lower leg limited to breakdown of skin: Secondary | ICD-10-CM | POA: Diagnosis not present

## 2014-08-13 DIAGNOSIS — L97311 Non-pressure chronic ulcer of right ankle limited to breakdown of skin: Secondary | ICD-10-CM | POA: Diagnosis not present

## 2014-08-13 DIAGNOSIS — L97321 Non-pressure chronic ulcer of left ankle limited to breakdown of skin: Secondary | ICD-10-CM | POA: Diagnosis not present

## 2014-08-13 DIAGNOSIS — I872 Venous insufficiency (chronic) (peripheral): Secondary | ICD-10-CM | POA: Diagnosis not present

## 2014-08-13 NOTE — Telephone Encounter (Signed)
Per Stanton Kidney, this has been sent to [facility].

## 2014-08-14 ENCOUNTER — Telehealth: Payer: Self-pay | Admitting: *Deleted

## 2014-08-14 NOTE — Telephone Encounter (Signed)
Left msg on triage stating wanting to inform md that [facility] not going to be able to accept  She has [employer] and [facility] does not accept...Johny Chess

## 2014-08-14 NOTE — Telephone Encounter (Signed)
Ok to forward to [facility] - will need different Home Health

## 2014-08-19 ENCOUNTER — Ambulatory Visit: Payer: Medicare Other | Admitting: Internal Medicine

## 2014-08-20 DIAGNOSIS — L97309 Non-pressure chronic ulcer of unspecified ankle with unspecified severity: Secondary | ICD-10-CM | POA: Diagnosis not present

## 2014-08-22 ENCOUNTER — Other Ambulatory Visit: Payer: Self-pay | Admitting: Internal Medicine

## 2014-08-28 ENCOUNTER — Encounter: Payer: Self-pay | Admitting: Internal Medicine

## 2014-08-28 DIAGNOSIS — M79676 Pain in unspecified toe(s): Secondary | ICD-10-CM

## 2014-09-03 ENCOUNTER — Encounter (HOSPITAL_BASED_OUTPATIENT_CLINIC_OR_DEPARTMENT_OTHER): Payer: Medicare Other | Attending: Surgery

## 2014-09-22 ENCOUNTER — Ambulatory Visit (INDEPENDENT_AMBULATORY_CARE_PROVIDER_SITE_OTHER): Payer: Medicare Other | Admitting: Podiatry

## 2014-09-22 ENCOUNTER — Encounter: Payer: Self-pay | Admitting: Podiatry

## 2014-09-22 VITALS — BP 102/86 | HR 82 | Resp 14

## 2014-09-22 DIAGNOSIS — B351 Tinea unguium: Secondary | ICD-10-CM | POA: Diagnosis not present

## 2014-09-22 NOTE — Progress Notes (Signed)
   Subjective:    Patient ID: Theresa Hill, female    DOB: 1915-10-08, 81 y.o.   MRN: [id number]  HPI N-THICK NAILS L-B/L TOENAILS D-1 MONTH O-SLOWLY C-WORSE A-NONE T-DAUTHER KEEP THEM FILE DOWN     Review of Systems  Constitutional: Positive for appetite change, fatigue and unexpected weight change.  Respiratory: Positive for cough, choking, chest tightness, shortness of breath, wheezing and stridor.   Cardiovascular: Positive for leg swelling.  Musculoskeletal: Positive for back pain and gait problem.  Skin: Positive for color change and wound.  Neurological: Positive for weakness.  Psychiatric/Behavioral: Positive for confusion. The patient is nervous/anxious.        Objective:   Physical Exam  Patient was lifted from wheelchair to treatment chair with daughter present in the room Patient not able to respond to questioning  Vascular: Patient has bandages on lower extremities Trace palpable DP and PT pulses palpated through bandages  Neurological: Ankle reflexes were equal reactive bilaterally Patient unable respond to 10 g monofilament wire Patient not able to respond to vibratory sensation  Dermatological: The lower extremities are wrapped from the knee to the proximal MPJ with the compression dressing covered with Coflex bandaging bilaterally The distal forefoot demonstrates atrophic skin without any hair growth with a general rubor, bilaterally The toenails are elongated brittle, discolored 6-10  Musculoskeletal: HAV deformities bilaterally       Assessment & Plan:   Assessment: Patient nonresponsive to questioning Decrease pedal pulses bilaterally Mycotic toenails 6-10 Lower extremity bandaging as noted above preventing complete physical examination of lower extremities without the need to remove  Plan: Debridement of toenails 10 without any bleeding Discuss findings with patient's daughter  Reappoint at patient's daughter's request

## 2014-09-24 ENCOUNTER — Encounter: Payer: Self-pay | Admitting: Internal Medicine

## 2014-10-03 ENCOUNTER — Telehealth: Payer: Self-pay | Admitting: Internal Medicine

## 2014-10-03 ENCOUNTER — Encounter: Payer: Self-pay | Admitting: Internal Medicine

## 2014-10-03 ENCOUNTER — Other Ambulatory Visit: Payer: Self-pay

## 2014-10-03 MED ORDER — PREDNISONE 2.5 MG PO TABS
ORAL_TABLET | ORAL | Status: AC
Start: 2014-10-03 — End: ?

## 2014-10-03 NOTE — Telephone Encounter (Signed)
Order for Respite care is requested by Hospice for 05/26-5/31

## 2014-10-03 NOTE — Telephone Encounter (Signed)
Verbal order given to Nurse case manager Soyla Murphy

## 2014-10-03 NOTE — Telephone Encounter (Signed)
Done hardcopy to Dahlia  

## 2014-10-03 NOTE — Telephone Encounter (Signed)
Patty beard of hospice needs to request a resbit between 5/26-5/31. She will be staying at [facility]. You can leave the order at [phone]

## 2014-10-08 ENCOUNTER — Encounter: Payer: Self-pay | Admitting: Cardiology

## 2014-10-09 ENCOUNTER — Encounter: Payer: Self-pay | Admitting: Internal Medicine

## 2014-10-09 ENCOUNTER — Non-Acute Institutional Stay (SKILLED_NURSING_FACILITY): Payer: Medicare Other | Admitting: Internal Medicine

## 2014-10-09 DIAGNOSIS — I501 Left ventricular failure: Secondary | ICD-10-CM | POA: Diagnosis not present

## 2014-10-09 DIAGNOSIS — J449 Chronic obstructive pulmonary disease, unspecified: Secondary | ICD-10-CM

## 2014-10-09 DIAGNOSIS — I5032 Chronic diastolic (congestive) heart failure: Secondary | ICD-10-CM

## 2014-10-09 NOTE — Progress Notes (Signed)
MRN: [id number] Name: Theresa Hill  Sex: female Age: 81 y.o. DOB: 12/21/1915  [place] #: [facility] Facility/Room:318 Level Of Care: SNF Provider: Inocencio Homes D Emergency Contacts: Extended Emergency Contact Information Primary Emergency Contact: Cockrell,Alice Address: [address], [place] Phone: [phone] Work Phone: [phone] Mobile Phone: [phone] Relation: Daughter  Code Status:DNR   Allergies: Review of patient's allergies indicates no known allergies.  Chief Complaint  Patient presents with  . New Admit To SNF    HPI: Patient is 81 y.o. female who is admitted for respite care for end stage CHF and COPD.  Past Medical History  Diagnosis Date  . HYPOTHYROIDISM 10/11/2006  . VITAMIN B12 DEFICIENCY 10/11/2006  . HYPONATREMIA 07/23/2007  . ANEMIA-NOS 09/14/2007  . HYPERTENSION 10/11/2006  . AORTIC STENOSIS 02/23/2007  . LEFT BUNDLE BRANCH BLOCK 10/11/2006  . CONGESTIVE HEART FAILURE 07/23/2007  . COPD 07/23/2007  . OSTEOPOROSIS 02/23/2007  . OSTEOPENIA 10/11/2006  . SAH (subarachnoid hemorrhage) 09/29/2010  . SDH (subdural hematoma) 09/29/2010  . GERD (gastroesophageal reflux disease) 09/29/2010  . DJD (degenerative joint disease) 09/29/2010  . Frequent falls     Past Surgical History  Procedure Laterality Date  . Tonsillectomy    . Abdominal hysterectomy    . Cataract surgery    . Appendectomy        Medication List       This list is accurate as of: 10/09/14 11:59 PM.  Always use your most recent med list.               acetaminophen 325 MG tablet  Commonly known as:  TYLENOL  Take 650 mg by mouth every 6 (six) hours as needed for moderate pain.     budesonide 0.5 MG/2ML nebulizer solution  Commonly known as:  PULMICORT  Take 2 mLs (0.5 mg total) by nebulization 2 (two) times daily.     budesonide-formoterol 160-4.5 MCG/ACT inhaler  Commonly known as:  SYMBICORT  Inhale 2 puffs into the lungs  2 (two) times daily.     calcitRIOL 0.25 MCG capsule  Commonly known as:  ROCALTROL  take 1 capsule by mouth once daily     clotrimazole-betamethasone cream  Commonly known as:  LOTRISONE  Apply topically See admin instructions.     ESTER C PO  Take 1,000 mg by mouth daily.     furosemide 20 MG tablet  Commonly known as:  LASIX  Take 2 tablets (40 mg total) by mouth daily.     levothyroxine 75 MCG tablet  Commonly known as:  SYNTHROID, LEVOTHROID  take 1 tablet by mouth once daily     levothyroxine 75 MCG tablet  Commonly known as:  SYNTHROID, LEVOTHROID  Take 75 mcg by mouth daily before breakfast.     multivitamin with minerals Tabs tablet  Take 1 tablet by mouth daily.     omeprazole 20 MG capsule  Commonly known as:  PRILOSEC  Take 20 mg by mouth daily.     potassium chloride SA 20 MEQ tablet  Commonly known as:  K-DUR,KLOR-CON  take 1 tablet by mouth twice a day     potassium chloride SA 20 MEQ tablet  Commonly known as:  K-DUR,KLOR-CON  Take 20 mEq by mouth 2 (two) times daily.     predniSONE 2.5 MG tablet  Commonly known as:  DELTASONE  One tablet by mouth once daily     PROAIR  HFA 108 (90 BASE) MCG/ACT inhaler  Generic drug:  albuterol  inhale 2 puffs by mouth every 6 hours if needed for wheezing     SANTYL ointment  Generic drug:  collagenase  Apply 1 application topically once a week.     traMADol-acetaminophen 37.5-325 MG per tablet  Commonly known as:  ULTRACET  take 1 tablet by mouth every 6 hours if needed for pain     triamcinolone cream 0.1 %  Commonly known as:  KENALOG  Apply 1 application topically 2 (two) times daily as needed. Statis derm--wound        No orders of the defined types were placed in this encounter.    Immunization History  Administered Date(s) Administered  . Influenza Split 01/28/2011  . Influenza Whole 02/07/2008, 03/05/2009, 02/04/2010, 05/08/2012  . Influenza,inj,Quad PF,36+ Mos 01/25/2013, 01/08/2014  .  Pneumococcal Polysaccharide-23 05/16/2006  . Td 09/24/2008  . Zoster 10/07/2009    History  Substance Use Topics  . Smoking status: Never Smoker   . Smokeless tobacco: Not on file  . Alcohol Use: No    Family history is noncontributory    Review of Systems  DATA OBTAINED: from patient, nurse, medical chart; pt has no acute c/o GENERAL:  no fevers, fatigue, appetite changes SKIN: No itching, rash or wounds EYES: No eye pain, redness, discharge EARS: No earache, tinnitus, change in hearing NOSE: No congestion, drainage or bleeding  MOUTH/THROAT: No mouth or tooth pain, No sore throat RESPIRATORY: No cough, wheezing, SOB CARDIAC: No chest pain, palpitations, lower extremity edema  GI: No abdominal pain, No N/V/D or constipation, No heartburn or reflux  GU: No dysuria, frequency or urgency, or incontinence  MUSCULOSKELETAL: No unrelieved bone/joint pain NEUROLOGIC: No headache, dizziness or focal weakness PSYCHIATRIC: No overt anxiety or sadness, No behavior issue.   Filed Vitals:   10/15/14 2229  BP: 81/52  Pulse: 67  Temp: 97.6 F (36.4 C)  Resp: 18    Physical Exam  GENERAL APPEARANCE: Alert, conversant, tiny WF No acute distress.  SKIN: No diaphoresis rash HEAD: Normocephalic, atraumatic  EYES: Conjunctiva/lids clear. Pupils round, reactive. EOMs intact.  EARS: External exam WNL, canals clear. Hearing grossly normal.  NOSE: No deformity or discharge.  MOUTH/THROAT: Lips w/o lesions  RESPIRATORY: Breathing is even, mild labored. Lung sounds are diffusely decreased , wearing O2 appears SOB CARDIOVASCULAR: Heart RRR no murmurs, rubs or gallops.; both legs are wrapped to knees   GASTROINTESTINAL: Abdomen is soft, non-tender, not distended w/ normal bowel sounds. GENITOURINARY: Bladder non tender, not distended  MUSCULOSKELETAL: severe kyphosis NEUROLOGIC:  Cranial nerves 2-12 grossly intact. Moves all extremities  PSYCHIATRIC: Mood and affect appropriate to  situation, no behavioral issues  Patient Active Problem List   Diagnosis Date Noted  . Cough 06/18/2014  . Skin lesion of face 04/16/2014  . Laceration 02/08/2014  . Hypercalcemia 02/08/2014  . COPD exacerbation 01/08/2014  . End of life care 01/08/2014  . Cellulitis of leg, right 11/09/2012  . Acute gout 11/13/2011  . Community acquired pneumonia 10/03/2011  . TIA (transient ischemic attack) 10/22/2010  . SAH (subarachnoid hemorrhage) 09/29/2010  . SDH (subdural hematoma) 09/29/2010  . GERD (gastroesophageal reflux disease) 09/29/2010  . DJD (degenerative joint disease) 09/29/2010  . Preventative health care 09/27/2010  . ANEMIA-NOS 09/14/2007  . LOW BACK PAIN 09/14/2007  . WEIGHT LOSS 09/14/2007  . HYPONATREMIA 07/23/2007  . Congestive heart failure 07/23/2007  . COPD (chronic obstructive pulmonary disease) 07/23/2007  . FATIGUE 07/23/2007  .  Aortic valve disorder 02/23/2007  . OSTEOPOROSIS 02/23/2007  . HYPOTHYROIDISM 10/11/2006  . VITAMIN B12 DEFICIENCY 10/11/2006  . Essential hypertension 10/11/2006  . LEFT BUNDLE BRANCH BLOCK 10/11/2006  . BRADYCARDIA 10/11/2006  . ALLERGIC RHINITIS 10/11/2006  . SYNCOPE 10/11/2006    CBC    Component Value Date/Time   WBC 12.2* 04/07/2014 1543   WBC 8.5 04/07/2013 1507   RBC 3.40* 04/07/2014 1543   RBC 3.23* 04/07/2013 1507   HGB 12.6 06/19/2014 1303   HGB 10.5* 04/07/2013 1507   HCT 37.0 06/19/2014 1303   HCT 33.8* 04/07/2013 1507   PLT 236.0 04/07/2014 1543   MCV 109.3 Repeated and verified X2.* 04/07/2014 1543   MCV 104.7* 04/07/2013 1507   LYMPHSABS 1.1 04/07/2014 1543   MONOABS 1.3* 04/07/2014 1543   EOSABS 0.1 04/07/2014 1543   BASOSABS 0.1 04/07/2014 1543    CMP     Component Value Date/Time   NA 139 06/19/2014 1303   K 3.9 06/19/2014 1303   CL 100 06/19/2014 1303   CO2 33* 05/06/2014 1442   GLUCOSE 100* 06/19/2014 1303   BUN 38* 06/19/2014 1303   CREATININE 1.10 06/19/2014 1303   CREATININE 0.93  05/06/2014 1442   CALCIUM 10.1 05/06/2014 1442   CALCIUM 9.9 10/07/2009 0000   PROT 6.7 11/22/2013 1213   ALBUMIN 3.7 11/22/2013 1213   AST 29 11/22/2013 1213   ALT 20 11/22/2013 1213   ALKPHOS 64 11/22/2013 1213   BILITOT 0.8 11/22/2013 1213   GFRNONAA 52* 05/06/2014 1442   GFRNONAA 44* 10/07/2011 0422   GFRAA 60 05/06/2014 1442   GFRAA 51* 10/07/2011 0422    Assessment and Plan  Congestive heart failure Considered end stage;no acute intervention planned here   COPD (chronic obstructive pulmonary disease) End stage respite,no acute intervention planned     Hennie Duos, MD

## 2014-10-15 ENCOUNTER — Encounter: Payer: Self-pay | Admitting: Internal Medicine

## 2014-10-15 NOTE — Assessment & Plan Note (Signed)
End stage respite,no acute intervention planned

## 2014-10-15 NOTE — Assessment & Plan Note (Signed)
Considered end stage;no acute intervention planned here

## 2014-10-16 ENCOUNTER — Telehealth: Payer: Self-pay | Admitting: Internal Medicine

## 2014-10-16 NOTE — Telephone Encounter (Signed)
Verbal authorization left on confidential hospice RN voicemail

## 2014-10-16 NOTE — Telephone Encounter (Signed)
Ok for verbal ,thanks 

## 2014-10-16 NOTE — Telephone Encounter (Signed)
Patty from Hospice is requesting verbal order for 5 night resbit stay at [facility] and rehab beginning June 17. She is aware Dr. Jenny Reichmann is out and it can wait till next week. Her number is [phone]

## 2014-10-17 ENCOUNTER — Encounter: Payer: Self-pay | Admitting: Internal Medicine

## 2014-10-24 ENCOUNTER — Ambulatory Visit: Payer: Medicare Other | Admitting: Cardiology

## 2014-10-31 DIAGNOSIS — I5020 Unspecified systolic (congestive) heart failure: Secondary | ICD-10-CM | POA: Diagnosis not present

## 2014-11-03 ENCOUNTER — Encounter: Payer: Self-pay | Admitting: Internal Medicine

## 2014-11-03 ENCOUNTER — Non-Acute Institutional Stay (SKILLED_NURSING_FACILITY): Payer: Medicare Other | Admitting: Internal Medicine

## 2014-11-03 DIAGNOSIS — E038 Other specified hypothyroidism: Secondary | ICD-10-CM | POA: Diagnosis not present

## 2014-11-03 DIAGNOSIS — E034 Atrophy of thyroid (acquired): Secondary | ICD-10-CM

## 2014-11-03 DIAGNOSIS — J449 Chronic obstructive pulmonary disease, unspecified: Secondary | ICD-10-CM

## 2014-11-03 DIAGNOSIS — I5032 Chronic diastolic (congestive) heart failure: Secondary | ICD-10-CM

## 2014-11-03 NOTE — Assessment & Plan Note (Signed)
End stage, respite;Plan - cont home meds- MDIs and prednisone 2.5 mg daily

## 2014-11-03 NOTE — Progress Notes (Signed)
MRN: [id number] Name: Theresa Hill  Sex: female Age: 81 y.o. DOB: [birth year]  [place] #: [facility] Facility/Room:318 Level Of Care: SNF Provider: Inocencio Homes D Emergency Contacts: Extended Emergency Contact Information Primary Emergency Contact: Cockrell,Alice Address: [address], [place] Phone: [phone] Work Phone: [phone] Mobile Phone: [phone] Relation: Daughter  Code Status: DNR  Allergies: Review of patient's allergies indicates no known allergies.  Chief Complaint  Patient presents with  . New Admit To SNF    HPI: Patient is 81 y.o. female who has endstage COPD and CHF, Hospice for same admitted to SNF for respite care.  Past Medical History  Diagnosis Date  . HYPOTHYROIDISM 10/11/2006  . VITAMIN B12 DEFICIENCY 10/11/2006  . HYPONATREMIA 07/23/2007  . ANEMIA-NOS 09/14/2007  . HYPERTENSION 10/11/2006  . AORTIC STENOSIS 02/23/2007  . LEFT BUNDLE BRANCH BLOCK 10/11/2006  . CONGESTIVE HEART FAILURE 07/23/2007  . COPD 07/23/2007  . OSTEOPOROSIS 02/23/2007  . OSTEOPENIA 10/11/2006  . SAH (subarachnoid hemorrhage) 09/29/2010  . SDH (subdural hematoma) 09/29/2010  . GERD (gastroesophageal reflux disease) 09/29/2010  . DJD (degenerative joint disease) 09/29/2010  . Frequent falls     Past Surgical History  Procedure Laterality Date  . Tonsillectomy    . Abdominal hysterectomy    . Cataract surgery    . Appendectomy        Medication List       This list is accurate as of: 11/03/14  9:58 PM.  Always use your most recent med list.               acetaminophen 325 MG tablet  Commonly known as:  TYLENOL  Take 650 mg by mouth every 6 (six) hours as needed for moderate pain.     budesonide 0.5 MG/2ML nebulizer solution  Commonly known as:  PULMICORT  Take 2 mLs (0.5 mg total) by nebulization 2 (two) times daily.     budesonide-formoterol 160-4.5 MCG/ACT inhaler  Commonly known as:  SYMBICORT  Inhale 2  puffs into the lungs 2 (two) times daily.     calcitRIOL 0.25 MCG capsule  Commonly known as:  ROCALTROL  take 1 capsule by mouth once daily     clotrimazole-betamethasone cream  Commonly known as:  LOTRISONE  Apply topically See admin instructions.     ESTER C PO  Take 1,000 mg by mouth daily.     furosemide 20 MG tablet  Commonly known as:  LASIX  Take 2 tablets (40 mg total) by mouth daily.     levothyroxine 75 MCG tablet  Commonly known as:  SYNTHROID, LEVOTHROID  take 1 tablet by mouth once daily     levothyroxine 75 MCG tablet  Commonly known as:  SYNTHROID, LEVOTHROID  Take 75 mcg by mouth daily before breakfast.     multivitamin with minerals Tabs tablet  Take 1 tablet by mouth daily.     omeprazole 20 MG capsule  Commonly known as:  PRILOSEC  Take 20 mg by mouth daily.     potassium chloride SA 20 MEQ tablet  Commonly known as:  K-DUR,KLOR-CON  take 1 tablet by mouth twice a day     potassium chloride SA 20 MEQ tablet  Commonly known as:  K-DUR,KLOR-CON  Take 20 mEq by mouth 2 (two) times daily.     predniSONE 2.5 MG tablet  Commonly known as:  DELTASONE  One tablet by mouth once daily  PROAIR HFA 108 (90 BASE) MCG/ACT inhaler  Generic drug:  albuterol  inhale 2 puffs by mouth every 6 hours if needed for wheezing     SANTYL ointment  Generic drug:  collagenase  Apply 1 application topically once a week.     traMADol-acetaminophen 37.5-325 MG per tablet  Commonly known as:  ULTRACET  take 1 tablet by mouth every 6 hours if needed for pain     triamcinolone cream 0.1 %  Commonly known as:  KENALOG  Apply 1 application topically 2 (two) times daily as needed. Statis derm--wound        No orders of the defined types were placed in this encounter.    Immunization History  Administered Date(s) Administered  . Influenza Split 01/28/2011  . Influenza Whole 02/07/2008, 03/05/2009, 02/04/2010, 05/08/2012  . Influenza,inj,Quad PF,36+ Mos  01/25/2013, 01/08/2014  . Pneumococcal Polysaccharide-23 05/16/2006  . Td 09/24/2008  . Zoster 10/07/2009    History  Substance Use Topics  . Smoking status: Never Smoker   . Smokeless tobacco: Not on file  . Alcohol Use: No    Family history is noncontributory    Review of Systems  DATA OBTAINED: pt and nursing GENERAL:  no fevers, +fatigue SKIN: No itching, rash  EYES: No eye pain, redness, discharge EARS: No earache, tinnitus, change in hearing NOSE: No congestion, drainage or bleeding  MOUTH/THROAT: No mouth or tooth pain, No sore throat RESPIRATORY: No cough, wheezing, chronically SOB CARDIAC: No chest pain, palpitations, lower extremity edema  GI: No abdominal pain, No N/V/D or constipation, No heartburn or reflux  GU: No dysuria, frequency or urgency, or incontinence  MUSCULOSKELETAL: No unrelieved bone/joint pain NEUROLOGIC: No headache, dizziness  PSYCHIATRIC: No overt anxiety or sadness, No behavior issue.   Filed Vitals:   11/03/14 1353  BP: 85/53  Pulse: 71  Temp: 96.9 F (36.1 C)  Resp: 20    Physical Exam  GENERAL APPEARANCE: Alert, conversant in a whisper,   SKIN: No diaphoresis rash HEAD: Normocephalic, atraumatic  EYES: Conjunctiva/lids clear. Pupils round, reactive. EOMs intact.  EARS: External exam WNL, canals clear. Hearing grossly normal.  NOSE: No deformity or discharge.  MOUTH/THROAT: Lips w/o lesions  RESPIRATORY: Breathing is even, unlabored. Lung sounds are diffusely decreased;wearing O2 , appears SOB  CARDIOVASCULAR: Heart RRR no murmurs, rubs or gallops. No peripheral edema.   GASTROINTESTINAL: Abdomen is soft, non-tender, not distended w/ normal bowel sounds. GENITOURINARY: Bladder non tender, not distended  MUSCULOSKELETAL; kyphosis NEUROLOGIC:  Cranial nerves 2-12 grossly intact. Moves all extremities  PSYCHIATRIC: Mood and affect appropriate to situation, no behavioral issues  Patient Active Problem List   Diagnosis Date Noted   . Cough 06/18/2014  . Skin lesion of face 04/16/2014  . Laceration 02/08/2014  . Hypercalcemia 02/08/2014  . COPD exacerbation 01/08/2014  . End of life care 01/08/2014  . Cellulitis of leg, right 11/09/2012  . Acute gout 11/13/2011  . Community acquired pneumonia 10/03/2011  . TIA (transient ischemic attack) 10/22/2010  . SAH (subarachnoid hemorrhage) 09/29/2010  . SDH (subdural hematoma) 09/29/2010  . GERD (gastroesophageal reflux disease) 09/29/2010  . DJD (degenerative joint disease) 09/29/2010  . Preventative health care 09/27/2010  . ANEMIA-NOS 09/14/2007  . LOW BACK PAIN 09/14/2007  . WEIGHT LOSS 09/14/2007  . HYPONATREMIA 07/23/2007  . Congestive heart failure 07/23/2007  . COPD (chronic obstructive pulmonary disease) 07/23/2007  . FATIGUE 07/23/2007  . Aortic valve disorder 02/23/2007  . OSTEOPOROSIS 02/23/2007  . Hypothyroidism 10/11/2006  . VITAMIN B12  DEFICIENCY 10/11/2006  . Essential hypertension 10/11/2006  . LEFT BUNDLE BRANCH BLOCK 10/11/2006  . BRADYCARDIA 10/11/2006  . ALLERGIC RHINITIS 10/11/2006  . SYNCOPE 10/11/2006    CBC    Component Value Date/Time   WBC 12.2* 04/07/2014 1543   WBC 8.5 04/07/2013 1507   RBC 3.40* 04/07/2014 1543   RBC 3.23* 04/07/2013 1507   HGB 12.6 06/19/2014 1303   HGB 10.5* 04/07/2013 1507   HCT 37.0 06/19/2014 1303   HCT 33.8* 04/07/2013 1507   PLT 236.0 04/07/2014 1543   MCV 109.3 Repeated and verified X2.* 04/07/2014 1543   MCV 104.7* 04/07/2013 1507   LYMPHSABS 1.1 04/07/2014 1543   MONOABS 1.3* 04/07/2014 1543   EOSABS 0.1 04/07/2014 1543   BASOSABS 0.1 04/07/2014 1543    CMP     Component Value Date/Time   NA 139 06/19/2014 1303   K 3.9 06/19/2014 1303   CL 100 06/19/2014 1303   CO2 33* 05/06/2014 1442   GLUCOSE 100* 06/19/2014 1303   BUN 38* 06/19/2014 1303   CREATININE 1.10 06/19/2014 1303   CREATININE 0.93 05/06/2014 1442   CALCIUM 10.1 05/06/2014 1442   CALCIUM 9.9 10/07/2009 0000   PROT 6.7  11/22/2013 1213   ALBUMIN 3.7 11/22/2013 1213   AST 29 11/22/2013 1213   ALT 20 11/22/2013 1213   ALKPHOS 64 11/22/2013 1213   BILITOT 0.8 11/22/2013 1213   GFRNONAA 52* 05/06/2014 1442   GFRNONAA 44* 10/07/2011 0422   GFRAA 60 05/06/2014 1442   GFRAA 51* 10/07/2011 0422    Assessment and Plan  Congestive heart failure At baseline, continue home dose of 40 mg daily of lasix; not on a bblocker or ACE  COPD (chronic obstructive pulmonary disease) End stage, respite;Plan - cont home meds- MDIs and prednisone 2.5 mg daily  Hypothyroidism Continue home dose synthroid 62mcg daily    Hennie Duos, MD

## 2014-11-03 NOTE — Assessment & Plan Note (Signed)
Continue home dose synthroid 81mcg daily

## 2014-11-03 NOTE — Assessment & Plan Note (Signed)
At baseline, continue home dose of 40 mg daily of lasix; not on a bblocker or ACE

## 2014-11-05 DIAGNOSIS — I501 Left ventricular failure: Secondary | ICD-10-CM | POA: Diagnosis not present

## 2014-11-13 ENCOUNTER — Other Ambulatory Visit: Payer: Self-pay | Admitting: Cardiology

## 2014-11-21 ENCOUNTER — Encounter: Payer: Self-pay | Admitting: Internal Medicine

## 2014-11-21 ENCOUNTER — Encounter: Payer: Self-pay | Admitting: Cardiology

## 2014-11-21 NOTE — Telephone Encounter (Signed)
Message from pt regarding passing

## 2014-11-25 ENCOUNTER — Telehealth: Payer: Self-pay

## 2014-11-25 NOTE — Telephone Encounter (Signed)
On 12-25-2014 I received a death certificate from [facility]. The death certificate is for burial. The patient is a patient of Doctor Cathlean Cower. The death certificate will be taken to Primary Care Unit this am for signature. On 12/25/14 I received the death certificate from [facility] who was covering for Doctor Jenny Reichmann. I got the death certificate ready for pickup and called the funeral home to let them know the death certificate was ready for pickup.

## 2014-11-25 NOTE — Telephone Encounter (Signed)
This was entered by error

## 2014-12-15 DEATH — deceased
